# Patient Record
Sex: Female | Born: 1975 | Race: White | Hispanic: No | Marital: Married | Smoking: Never smoker
Health system: Southern US, Community
[De-identification: ages and names within clinical notes are randomized; demographics above are authoritative.]

## PROBLEM LIST (undated history)

## (undated) DIAGNOSIS — K219 Gastro-esophageal reflux disease without esophagitis: Secondary | ICD-10-CM

## (undated) DIAGNOSIS — I10 Essential (primary) hypertension: Secondary | ICD-10-CM

## (undated) DIAGNOSIS — D259 Leiomyoma of uterus, unspecified: Secondary | ICD-10-CM

## (undated) DIAGNOSIS — Z8669 Personal history of other diseases of the nervous system and sense organs: Secondary | ICD-10-CM

## (undated) DIAGNOSIS — R002 Palpitations: Secondary | ICD-10-CM

## (undated) DIAGNOSIS — G43B0 Ophthalmoplegic migraine, not intractable: Secondary | ICD-10-CM

## (undated) DIAGNOSIS — Z872 Personal history of diseases of the skin and subcutaneous tissue: Secondary | ICD-10-CM

## (undated) DIAGNOSIS — D649 Anemia, unspecified: Secondary | ICD-10-CM

## (undated) DIAGNOSIS — Z87898 Personal history of other specified conditions: Secondary | ICD-10-CM

## (undated) DIAGNOSIS — O139 Gestational [pregnancy-induced] hypertension without significant proteinuria, unspecified trimester: Secondary | ICD-10-CM

## (undated) DIAGNOSIS — E039 Hypothyroidism, unspecified: Secondary | ICD-10-CM

## (undated) DIAGNOSIS — K5190 Ulcerative colitis, unspecified, without complications: Secondary | ICD-10-CM

## (undated) DIAGNOSIS — Z973 Presence of spectacles and contact lenses: Secondary | ICD-10-CM

## (undated) DIAGNOSIS — E7800 Pure hypercholesterolemia, unspecified: Secondary | ICD-10-CM

## (undated) DIAGNOSIS — F329 Major depressive disorder, single episode, unspecified: Secondary | ICD-10-CM

## (undated) DIAGNOSIS — R0600 Dyspnea, unspecified: Secondary | ICD-10-CM

## (undated) DIAGNOSIS — F32A Depression, unspecified: Secondary | ICD-10-CM

## (undated) HISTORY — DX: Ophthalmoplegic migraine, not intractable: G43.B0

## (undated) HISTORY — PX: DILATION AND CURETTAGE OF UTERUS: SHX78

## (undated) HISTORY — PX: COLONOSCOPY: SHX174

## (undated) HISTORY — DX: Ulcerative colitis, unspecified, without complications: K51.90

---

## 1998-05-09 ENCOUNTER — Other Ambulatory Visit: Admission: RE | Admit: 1998-05-09 | Discharge: 1998-05-09 | Payer: Self-pay

## 1999-12-07 ENCOUNTER — Other Ambulatory Visit: Admission: RE | Admit: 1999-12-07 | Discharge: 1999-12-07 | Payer: Self-pay | Admitting: Obstetrics and Gynecology

## 2000-01-28 ENCOUNTER — Encounter: Payer: Self-pay | Admitting: Obstetrics and Gynecology

## 2000-01-28 ENCOUNTER — Ambulatory Visit (HOSPITAL_COMMUNITY): Admission: RE | Admit: 2000-01-28 | Discharge: 2000-01-28 | Payer: Self-pay | Admitting: Obstetrics and Gynecology

## 2000-02-25 ENCOUNTER — Ambulatory Visit (HOSPITAL_COMMUNITY): Admission: RE | Admit: 2000-02-25 | Discharge: 2000-02-25 | Payer: Self-pay | Admitting: Obstetrics and Gynecology

## 2000-02-25 ENCOUNTER — Encounter: Payer: Self-pay | Admitting: Obstetrics and Gynecology

## 2000-04-28 ENCOUNTER — Inpatient Hospital Stay (HOSPITAL_COMMUNITY): Admission: AD | Admit: 2000-04-28 | Discharge: 2000-04-28 | Payer: Self-pay | Admitting: Obstetrics and Gynecology

## 2000-05-05 ENCOUNTER — Encounter (HOSPITAL_COMMUNITY): Admission: RE | Admit: 2000-05-05 | Discharge: 2000-06-03 | Payer: Self-pay | Admitting: Obstetrics and Gynecology

## 2000-05-26 ENCOUNTER — Inpatient Hospital Stay (HOSPITAL_COMMUNITY): Admission: AD | Admit: 2000-05-26 | Discharge: 2000-05-26 | Payer: Self-pay | Admitting: Obstetrics and Gynecology

## 2000-05-30 ENCOUNTER — Inpatient Hospital Stay (HOSPITAL_COMMUNITY): Admission: AD | Admit: 2000-05-30 | Discharge: 2000-06-01 | Payer: Self-pay | Admitting: Obstetrics and Gynecology

## 2000-05-31 ENCOUNTER — Encounter: Payer: Self-pay | Admitting: Obstetrics and Gynecology

## 2000-06-02 ENCOUNTER — Inpatient Hospital Stay (HOSPITAL_COMMUNITY): Admission: AD | Admit: 2000-06-02 | Discharge: 2000-06-05 | Payer: Self-pay | Admitting: Obstetrics and Gynecology

## 2000-06-02 ENCOUNTER — Encounter (INDEPENDENT_AMBULATORY_CARE_PROVIDER_SITE_OTHER): Payer: Self-pay | Admitting: Specialist

## 2000-06-07 ENCOUNTER — Encounter: Admission: RE | Admit: 2000-06-07 | Discharge: 2000-06-21 | Payer: Self-pay | Admitting: Obstetrics and Gynecology

## 2000-12-13 ENCOUNTER — Other Ambulatory Visit: Admission: RE | Admit: 2000-12-13 | Discharge: 2000-12-13 | Payer: Self-pay | Admitting: Obstetrics and Gynecology

## 2001-12-22 ENCOUNTER — Other Ambulatory Visit: Admission: RE | Admit: 2001-12-22 | Discharge: 2001-12-22 | Payer: Self-pay | Admitting: Obstetrics and Gynecology

## 2003-02-14 ENCOUNTER — Other Ambulatory Visit: Admission: RE | Admit: 2003-02-14 | Discharge: 2003-02-14 | Payer: Self-pay | Admitting: Obstetrics and Gynecology

## 2003-11-29 ENCOUNTER — Other Ambulatory Visit: Admission: RE | Admit: 2003-11-29 | Discharge: 2003-11-29 | Payer: Self-pay | Admitting: Obstetrics and Gynecology

## 2004-05-26 ENCOUNTER — Inpatient Hospital Stay (HOSPITAL_COMMUNITY): Admission: AD | Admit: 2004-05-26 | Discharge: 2004-05-26 | Payer: Self-pay | Admitting: Obstetrics and Gynecology

## 2004-06-04 ENCOUNTER — Inpatient Hospital Stay (HOSPITAL_COMMUNITY): Admission: AD | Admit: 2004-06-04 | Discharge: 2004-06-06 | Payer: Self-pay | Admitting: Obstetrics and Gynecology

## 2004-07-17 ENCOUNTER — Other Ambulatory Visit: Admission: RE | Admit: 2004-07-17 | Discharge: 2004-07-17 | Payer: Self-pay | Admitting: Obstetrics and Gynecology

## 2005-01-18 ENCOUNTER — Other Ambulatory Visit: Admission: RE | Admit: 2005-01-18 | Discharge: 2005-01-18 | Payer: Self-pay | Admitting: Obstetrics and Gynecology

## 2006-08-03 ENCOUNTER — Encounter: Admission: RE | Admit: 2006-08-03 | Discharge: 2006-08-03 | Payer: Self-pay | Admitting: Gastroenterology

## 2006-08-22 ENCOUNTER — Ambulatory Visit (HOSPITAL_COMMUNITY): Admission: RE | Admit: 2006-08-22 | Discharge: 2006-08-22 | Payer: Self-pay | Admitting: *Deleted

## 2006-08-22 ENCOUNTER — Encounter (INDEPENDENT_AMBULATORY_CARE_PROVIDER_SITE_OTHER): Payer: Self-pay | Admitting: Specialist

## 2006-08-30 HISTORY — PX: CHOLECYSTECTOMY: SHX55

## 2009-07-30 HISTORY — PX: DILATION AND CURETTAGE OF UTERUS: SHX78

## 2009-08-25 ENCOUNTER — Ambulatory Visit (HOSPITAL_COMMUNITY): Admission: RE | Admit: 2009-08-25 | Discharge: 2009-08-25 | Payer: Self-pay | Admitting: Obstetrics and Gynecology

## 2009-11-02 ENCOUNTER — Emergency Department (HOSPITAL_COMMUNITY): Admission: EM | Admit: 2009-11-02 | Discharge: 2009-11-03 | Payer: Self-pay | Admitting: Emergency Medicine

## 2009-11-17 ENCOUNTER — Ambulatory Visit (HOSPITAL_COMMUNITY): Admission: RE | Admit: 2009-11-17 | Discharge: 2009-11-17 | Payer: Self-pay | Admitting: Obstetrics and Gynecology

## 2010-09-15 LAB — TYPE AND SCREEN: Antibody Screen: NEGATIVE

## 2010-09-15 LAB — ABO/RH: RH Type: POSITIVE

## 2010-09-15 LAB — RUBELLA ANTIBODY, IGM: Rubella: IMMUNE

## 2010-09-15 LAB — RPR: RPR: NONREACTIVE

## 2010-11-20 LAB — CBC
HCT: 38.3 % (ref 36.0–46.0)
Hemoglobin: 12.9 g/dL (ref 12.0–15.0)
MCHC: 33.8 g/dL (ref 30.0–36.0)
MCV: 90.5 fL (ref 78.0–100.0)
Platelets: 322 10*3/uL (ref 150–400)
RBC: 4.23 MIL/uL (ref 3.87–5.11)
RDW: 11.9 % (ref 11.5–15.5)
WBC: 6.3 10*3/uL (ref 4.0–10.5)

## 2010-11-20 LAB — HCG, QUANTITATIVE, PREGNANCY: hCG, Beta Chain, Quant, S: <2 m[IU]/mL (ref <5)

## 2010-11-23 LAB — COMPREHENSIVE METABOLIC PANEL
ALT: 12 U/L (ref 0–35)
Albumin: 3.9 g/dL (ref 3.5–5.2)
Alkaline Phosphatase: 53 U/L (ref 39–117)
BUN: 10 mg/dL (ref 6–23)
Chloride: 107 mEq/L (ref 96–112)
Glucose, Bld: 100 mg/dL — ABNORMAL HIGH (ref 70–99)
Potassium: 3.7 mEq/L (ref 3.5–5.1)
Sodium: 136 mEq/L (ref 135–145)
Total Bilirubin: 0.4 mg/dL (ref 0.3–1.2)
Total Protein: 6.7 g/dL (ref 6.0–8.3)

## 2010-11-23 LAB — POCT I-STAT, CHEM 8
Calcium, Ion: 1.1 mmol/L — ABNORMAL LOW (ref 1.12–1.32)
Creatinine, Ser: 0.4 mg/dL (ref 0.4–1.2)
Glucose, Bld: 96 mg/dL (ref 70–99)
HCT: 36 % (ref 36.0–46.0)
Hemoglobin: 12.2 g/dL (ref 12.0–15.0)
Potassium: 3.7 mEq/L (ref 3.5–5.1)
TCO2: 22 mmol/L (ref 0–100)

## 2010-11-23 LAB — URINE MICROSCOPIC-ADD ON

## 2010-11-23 LAB — URINALYSIS, ROUTINE W REFLEX MICROSCOPIC
Nitrite: NEGATIVE
Protein, ur: 30 mg/dL — AB
Specific Gravity, Urine: 1.01 (ref 1.005–1.030)
Urobilinogen, UA: 0.2 mg/dL (ref 0.0–1.0)

## 2010-11-23 LAB — DIFFERENTIAL
Basophils Absolute: 0 10*3/uL (ref 0.0–0.1)
Basophils Relative: 0 % (ref 0–1)
Eosinophils Absolute: 0.6 10*3/uL (ref 0.0–0.7)
Monocytes Absolute: 0.8 10*3/uL (ref 0.1–1.0)
Monocytes Relative: 8 % (ref 3–12)
Neutro Abs: 4.7 10*3/uL (ref 1.7–7.7)

## 2010-11-23 LAB — URINE CULTURE

## 2010-11-23 LAB — CBC
HCT: 36.2 % (ref 36.0–46.0)
Hemoglobin: 12.4 g/dL (ref 12.0–15.0)
Platelets: 339 10*3/uL (ref 150–400)
WBC: 9.7 10*3/uL (ref 4.0–10.5)

## 2010-11-23 LAB — POCT CARDIAC MARKERS: CKMB, poc: <1 ng/mL — ABNORMAL LOW (ref 1.0–8.0)

## 2010-11-30 LAB — CBC
HCT: 36.4 % (ref 36.0–46.0)
Hemoglobin: 12.3 g/dL (ref 12.0–15.0)
MCHC: 33.9 g/dL (ref 30.0–36.0)
MCV: 90.1 fL (ref 78.0–100.0)
RBC: 4.04 MIL/uL (ref 3.87–5.11)
RDW: 12.2 % (ref 11.5–15.5)
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
## 2011-01-15 NOTE — Discharge Summary (Signed)
[place]  Patient:    Gabrielle Wilkins, Gabrielle Wilkins                          MRN: [id number] Adm. Date:  [id number] Disc. Date: [id number] Attending:  Lenell Antu                           Discharge Summary  DISCHARGE DIAGNOSES:          1. Preterm pregnancy at 35+ weeks.                               2. Chronic hypertension.                               3. Rule out preeclampsia.  DISCHARGE FOLLOW-UP:          Patient is to follow up in our office in two days for repeat blood pressure checks and is to check her blood pressures at home.  DISCHARGE MEDICATIONS:        Aldomet 500 mg b.i.d. q.a.m. and q.p.m.  HOSPITAL COURSE:              Patient is a 35 year old white female G1, P0 who was admitted at 91 4/7 weeks with increasing blood pressure.  Patient known to have chronic hypertension, however, required only Aldomet 250 mg q.d. during pregnancy and was quite intolerant of higher doses.  Recently, patient had had increasing blood pressures noted to be 150/110 on her scheduled NST.  She did have some headache and some right upper quadrant pain periodically, but no nausea and vomiting and no increased swelling.  Her preeclamptic laboratories are stable and she was admitted to perform a 24 hour urine and have optimization of her blood pressure control.  Pregnancy otherwise had been uncomplicated except for the hypertension.  PRENATAL LABORATORIES:        O+.  RPR nonreactive.  Rubella equivocal. Hepatitis B surface antigen negative.  HIV negative.  GC negative.  Chlamydia negative.  GBS negative.  GYNECOLOGICAL HISTORY:        Patient had mild dysplasia in 1998.  PAST MEDICAL HISTORY:         Chronic hypertension.  PAST SURGICAL HISTORY:        None.  MEDICATIONS:                  Aldomet 250 mg q.d.  ALLERGIES:                    CECLOR.  PHYSICAL EXAMINATION:  VITAL SIGNS:                  Blood pressure 156/111.  Other vital signs  were stable.  Fetal heart rate was reactive.  ABDOMEN:                      She was gravid in the EFW of approximately 6.5 pounds.  LUNGS:                        Clear.  HEART:                        Regular rate and rhythm.  EXTREMITIES:  Only with trace edema.  DTRs 2+ bilaterally with no clonus noted.                                The patient was admitted at 35 weeks with worsening hypertension and was going to have an ultrasound to rule out any oligo or IUGR.  On hospital day #2 the patient was feeling better.  Many of her symptoms had resolved.  Blood pressures were 140s-150s/90s-100 and that was on 250 mg b.i.d. of Aldomet.  Ultrasound revealed an EFW in the seventeenth percentile with normal AFI.  Dopplers were normal.  On hospital day #3 patient felt better with no significant PIH symptoms.  Blood pressures were 138-168/90-108.  Fetal heart rate was reactive.  She still had no other physical manifestations of preeclampsia.  Blood pressures on bed rest had improved, but still had room for optimization.  Therefore, the patient was increased to 500 mg p.o. b.i.d. of her Aldomet and her blood pressures were monitored throughout the day.  Later in the afternoon her blood pressures were consistently 150s/90s-102.  Therefore, the decision was made to discharge her to home to monitor her blood pressures and symptoms at home and call should they change significantly. DD:  06/27/00 TD:  06/27/00 Job: [id number]

## 2011-01-15 NOTE — Op Note (Signed)
Gabrielle Wilkins, Gabrielle Wilkins                   ACCOUNT NO.:  [id number]   MEDICAL RECORD NO.:  [id number]          PATIENT TYPE:  AMB   LOCATION:  DAY                          FACILITY:  [facility]   PHYSICIAN:  Jonne Ply, MD   DATE OF BIRTH:  [birth year]   DATE OF PROCEDURE:  08/22/2006  DATE OF DISCHARGE:                               OPERATIVE REPORT   PREOPERATIVE DIAGNOSIS:  Cholelithiasis.   POSTOPERATIVE DIAGNOSIS:  Cholelithiasis.   PROCEDURE:  Laparoscopic cholecystectomy.   SURGEON:  Glean Hess, M.D.   ANESTHESIA:  General.   ASSISTANT:  None.   DESCRIPTION:  The patient was taken to the operating room, placed in a  supine position.  After adequate general anesthesia was induced using  endotracheal tube, the abdomen was prepped and draped in normal sterile  fashion.  Using a transverse infraumbilical incision I dissected down to  fascia.  Fascia was opened vertically.  A 0 Vicryl pursestring suture  was placed around the fascial defect and Hassan trocar was placed in the  abdomen.  Pneumoperitoneum was obtained.  Under direct vision additional  11 mm trocars and 5 mm trocars were placed in the abdomen.  The  gallbladder was identified and retracted cephalad.  The neck of the  gallbladder was identified.  There was a number of adhesions to it.  I  dissected those off bluntly.  The cystic duct was identified with a  critical view and dissected and clipped proximally right on the  gallbladder.  Small ductotomy was made and cholangiocatheter was  inserted within the cystic duct.  Cholangiogram was performed which  showed normal filling of the common duct and the both hepatic ducts and  free flow into the duodenum.  The cystic duct was then triply clipped  and divided and cholangiocatheter was removed.  The cystic artery was  then identified again with a critical view and triply clipped and  divided.  Gallbladder was taken off the gallbladder bed using Bovie  electrocautery,  placed in EndoCatch bag and removed through the  umbilical port.  Adequate hemostasis was ensured.  A small piece of  Surgicel was placed in the abdomen right in the gallbladder fossa and  there was no evidence of bleeding or bile leakage.  The pneumoperitoneum  was released and trocars were removed.  The infraumbilical fascial  defect was closed using the 0 Vicryl pursestring suture.  The skin  incisions were closed using subcuticular 4-0 Monocryls and then Steri-  Strips.  Sterile dressings were applied.  The patient tolerated the  procedure well, went to PACU in good condition.      Jonne Ply, MD  Electronically Signed     KRE/MEDQ  D:  08/22/2006  T:  08/22/2006  Job:  [id number]
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
## 2011-02-26 ENCOUNTER — Other Ambulatory Visit (HOSPITAL_COMMUNITY): Payer: Self-pay | Admitting: Obstetrics and Gynecology

## 2011-02-26 DIAGNOSIS — O4400 Placenta previa specified as without hemorrhage, unspecified trimester: Secondary | ICD-10-CM

## 2011-03-01 ENCOUNTER — Ambulatory Visit (HOSPITAL_COMMUNITY)
Admission: RE | Admit: 2011-03-01 | Discharge: 2011-03-01 | Disposition: A | Discharge status: Home / Self Care | Payer: BC Managed Care – PPO | Source: Ambulatory Visit | Attending: Obstetrics and Gynecology | Admitting: Obstetrics and Gynecology

## 2011-03-01 DIAGNOSIS — O4400 Placenta previa specified as without hemorrhage, unspecified trimester: Secondary | ICD-10-CM

## 2011-03-01 DIAGNOSIS — O09299 Supervision of pregnancy with other poor reproductive or obstetric history, unspecified trimester: Secondary | ICD-10-CM | POA: Insufficient documentation

## 2011-03-01 DIAGNOSIS — O4410 Placenta previa with hemorrhage, unspecified trimester: Secondary | ICD-10-CM | POA: Insufficient documentation

## 2011-03-22 LAB — STREP B DNA PROBE: GBS: NEGATIVE

## 2011-04-04 ENCOUNTER — Encounter (HOSPITAL_COMMUNITY): Payer: Self-pay | Admitting: *Deleted

## 2011-04-04 ENCOUNTER — Other Ambulatory Visit: Payer: Self-pay | Admitting: Obstetrics and Gynecology

## 2011-04-04 ENCOUNTER — Inpatient Hospital Stay (HOSPITAL_COMMUNITY)
Admission: AD | Admit: 2011-04-04 | Discharge: 2011-04-07 | DRG: 651 | Disposition: A | Discharge status: Home / Self Care | Payer: BC Managed Care – PPO | Source: Ambulatory Visit | Attending: Obstetrics and Gynecology | Admitting: Obstetrics and Gynecology

## 2011-04-04 DIAGNOSIS — O321XX0 Maternal care for breech presentation, not applicable or unspecified: Secondary | ICD-10-CM | POA: Diagnosis present

## 2011-04-04 DIAGNOSIS — O1002 Pre-existing essential hypertension complicating childbirth: Secondary | ICD-10-CM | POA: Diagnosis present

## 2011-04-04 DIAGNOSIS — O4410 Placenta previa with hemorrhage, unspecified trimester: Principal | ICD-10-CM | POA: Diagnosis present

## 2011-04-04 HISTORY — DX: Essential (primary) hypertension: I10

## 2011-04-04 HISTORY — DX: Depression, unspecified: F32.A

## 2011-04-04 HISTORY — DX: Gestational (pregnancy-induced) hypertension without significant proteinuria, unspecified trimester: O13.9

## 2011-04-04 HISTORY — DX: Major depressive disorder, single episode, unspecified: F32.9

## 2011-04-05 ENCOUNTER — Inpatient Hospital Stay (HOSPITAL_COMMUNITY): Payer: BC Managed Care – PPO | Admitting: Anesthesiology

## 2011-04-05 ENCOUNTER — Encounter (HOSPITAL_COMMUNITY): Payer: Self-pay | Admitting: *Deleted

## 2011-04-05 ENCOUNTER — Inpatient Hospital Stay (HOSPITAL_COMMUNITY): Admission: RE | Admit: 2011-04-05 | Payer: BC Managed Care – PPO | Source: Ambulatory Visit

## 2011-04-05 ENCOUNTER — Encounter (HOSPITAL_COMMUNITY)
Admission: AD | Disposition: A | Discharge status: Home / Self Care | Payer: Self-pay | Source: Ambulatory Visit | Attending: Obstetrics and Gynecology

## 2011-04-05 ENCOUNTER — Encounter (HOSPITAL_COMMUNITY): Payer: Self-pay | Admitting: Anesthesiology

## 2011-04-05 LAB — TYPE AND SCREEN: ABO/RH(D): O POS

## 2011-04-05 LAB — CBC
MCH: 29.9 pg (ref 26.0–34.0)
MCHC: 34.4 g/dL (ref 30.0–36.0)
RDW: 13.2 % (ref 11.5–15.5)

## 2011-04-05 LAB — COMPREHENSIVE METABOLIC PANEL
ALT: 13 U/L (ref 0–35)
Albumin: 2.8 g/dL — ABNORMAL LOW (ref 3.5–5.2)
Alkaline Phosphatase: 178 U/L — ABNORMAL HIGH (ref 39–117)
BUN: 5 mg/dL — ABNORMAL LOW (ref 6–23)
Calcium: 9.6 mg/dL (ref 8.4–10.5)
Potassium: 4.1 mEq/L (ref 3.5–5.1)
Sodium: 136 mEq/L (ref 135–145)
Total Protein: 6.6 g/dL (ref 6.0–8.3)

## 2011-04-05 SURGERY — Surgical Case
Anesthesia: Spinal | Site: Abdomen | Wound class: Clean Contaminated

## 2011-04-05 MED ORDER — DIPHENHYDRAMINE HCL 50 MG/ML IJ SOLN: 25.0000 mg | INTRAMUSCULAR | Status: DC | PRN

## 2011-04-05 MED ORDER — FENTANYL CITRATE 0.05 MG/ML IJ SOLN
INTRAMUSCULAR | Status: AC
  Filled 2011-04-05: qty 2

## 2011-04-05 MED ORDER — CITRIC ACID-SODIUM CITRATE 334-500 MG/5ML PO SOLN: 30.0000 mL | Freq: Once | ORAL | Status: DC

## 2011-04-05 MED ORDER — TETANUS-DIPHTH-ACELL PERTUSSIS 5-2.5-18.5 LF-MCG/0.5 IM SUSP
0.5000 mL | Freq: Once | INTRAMUSCULAR | Status: AC
  Administered 2011-04-06: 0.5 mL via INTRAMUSCULAR
  Filled 2011-04-05: qty 0.5

## 2011-04-05 MED ORDER — IBUPROFEN 600 MG PO TABS
600.0000 mg | ORAL_TABLET | Freq: Four times a day (QID) | ORAL | Status: DC
  Administered 2011-04-05 – 2011-04-07 (×8): 600 mg via ORAL
  Filled 2011-04-05 (×5): qty 1

## 2011-04-05 MED ORDER — LACTATED RINGERS IV SOLN
INTRAVENOUS | Status: DC
  Administered 2011-04-05 (×5): via INTRAVENOUS

## 2011-04-05 MED ORDER — ONDANSETRON HCL 4 MG/2ML IJ SOLN
INTRAMUSCULAR | Status: AC
  Filled 2011-04-05: qty 2

## 2011-04-05 MED ORDER — WITCH HAZEL-GLYCERIN EX PADS: 1.0000 "application " | MEDICATED_PAD | CUTANEOUS | Status: DC | PRN

## 2011-04-05 MED ORDER — KETOROLAC TROMETHAMINE 30 MG/ML IJ SOLN: 30.0000 mg | Freq: Four times a day (QID) | INTRAMUSCULAR | Status: AC | PRN

## 2011-04-05 MED ORDER — SODIUM CHLORIDE 0.9 % IR SOLN
Status: DC | PRN
  Administered 2011-04-05: 1000 mL

## 2011-04-05 MED ORDER — OXYCODONE-ACETAMINOPHEN 5-325 MG PO TABS
1.0000 | ORAL_TABLET | ORAL | Status: DC | PRN
  Administered 2011-04-05 – 2011-04-06 (×4): 1 via ORAL
  Filled 2011-04-05 (×4): qty 1

## 2011-04-05 MED ORDER — GENTAMICIN IN SALINE 1-0.9 MG/ML-% IV SOLN: 100.0000 mg | INTRAVENOUS | Status: DC

## 2011-04-05 MED ORDER — CLINDAMYCIN PHOSPHATE 600 MG/50ML IV SOLN
INTRAVENOUS | Status: DC | PRN
  Administered 2011-04-05: 900 mg via INTRAVENOUS

## 2011-04-05 MED ORDER — MORPHINE SULFATE (PF) 0.5 MG/ML IJ SOLN
INTRAMUSCULAR | Status: DC | PRN
Start: 2011-04-05 — End: 2011-04-05
  Administered 2011-04-05 (×3): 1000 ug via INTRAVENOUS
  Administered 2011-04-05: 850 ug via INTRAVENOUS
  Administered 2011-04-05: 1000 ug via INTRAVENOUS

## 2011-04-05 MED ORDER — CLINDAMYCIN PHOSPHATE 900 MG/50ML IV SOLN: 900.0000 mg | INTRAVENOUS | Status: DC

## 2011-04-05 MED ORDER — ZOLPIDEM TARTRATE 5 MG PO TABS: 5.0000 mg | ORAL_TABLET | Freq: Every evening | ORAL | Status: DC | PRN

## 2011-04-05 MED ORDER — DIBUCAINE 1 % RE OINT: 1.0000 "application " | TOPICAL_OINTMENT | RECTAL | Status: DC | PRN

## 2011-04-05 MED ORDER — OXYTOCIN 10 UNIT/ML IJ SOLN
INTRAMUSCULAR | Status: AC
  Filled 2011-04-05: qty 4

## 2011-04-05 MED ORDER — GENTAMICIN SULFATE 40 MG/ML IJ SOLN
Freq: Once | INTRAVENOUS | Status: DC
  Filled 2011-04-05: qty 2.5

## 2011-04-05 MED ORDER — SENNOSIDES-DOCUSATE SODIUM 8.6-50 MG PO TABS
2.0000 | ORAL_TABLET | Freq: Every day | ORAL | Status: DC
  Administered 2011-04-05 – 2011-04-06 (×2): 2 via ORAL

## 2011-04-05 MED ORDER — NALOXONE HCL 0.4 MG/ML IJ SOLN: 0.4000 mg | INTRAMUSCULAR | Status: DC | PRN

## 2011-04-05 MED ORDER — MENTHOL 3 MG MT LOZG: 1.0000 | LOZENGE | OROMUCOSAL | Status: DC | PRN

## 2011-04-05 MED ORDER — OXYTOCIN 20 UNITS IN LACTATED RINGERS INFUSION - SIMPLE: 125.0000 mL/h | INTRAVENOUS | Status: AC

## 2011-04-05 MED ORDER — PHENYLEPHRINE HCL 10 MG/ML IJ SOLN
INTRAMUSCULAR | Status: DC | PRN
  Administered 2011-04-05: 120 ug via INTRAVENOUS
  Administered 2011-04-05: 80 ug via INTRAVENOUS

## 2011-04-05 MED ORDER — DIPHENHYDRAMINE HCL 25 MG PO CAPS: 25.0000 mg | ORAL_CAPSULE | Freq: Four times a day (QID) | ORAL | Status: DC | PRN

## 2011-04-05 MED ORDER — SODIUM CHLORIDE 0.9 % IV SOLN
1.0000 ug/kg/h | INTRAVENOUS | Status: DC | PRN
  Filled 2011-04-05: qty 2.5

## 2011-04-05 MED ORDER — ONDANSETRON HCL 4 MG PO TABS: 4.0000 mg | ORAL_TABLET | ORAL | Status: DC | PRN

## 2011-04-05 MED ORDER — KETOROLAC TROMETHAMINE 60 MG/2ML IM SOLN
60.0000 mg | Freq: Once | INTRAMUSCULAR | Status: AC | PRN
  Administered 2011-04-05: 60 mg via INTRAMUSCULAR

## 2011-04-05 MED ORDER — CLINDAMYCIN PHOSPHATE 900 MG/50ML IV SOLN
900.0000 mg | INTRAVENOUS | Status: DC
Start: 2011-04-05 — End: 2011-04-05

## 2011-04-05 MED ORDER — MEPERIDINE HCL 25 MG/ML IJ SOLN
INTRAMUSCULAR | Status: AC
  Filled 2011-04-05: qty 1

## 2011-04-05 MED ORDER — NALBUPHINE HCL 10 MG/ML IJ SOLN
5.0000 mg | INTRAMUSCULAR | Status: DC | PRN
  Filled 2011-04-05: qty 1

## 2011-04-05 MED ORDER — LANOLIN HYDROUS EX OINT: 1.0000 "application " | TOPICAL_OINTMENT | CUTANEOUS | Status: DC | PRN

## 2011-04-05 MED ORDER — MORPHINE SULFATE 0.5 MG/ML IJ SOLN
INTRAMUSCULAR | Status: AC
  Filled 2011-04-05: qty 10

## 2011-04-05 MED ORDER — DIPHENHYDRAMINE HCL 25 MG PO CAPS: 25.0000 mg | ORAL_CAPSULE | ORAL | Status: DC | PRN

## 2011-04-05 MED ORDER — SCOPOLAMINE 1 MG/3DAYS TD PT72
1.0000 | MEDICATED_PATCH | Freq: Once | TRANSDERMAL | Status: DC
  Administered 2011-04-05: 1.5 mg via TRANSDERMAL

## 2011-04-05 MED ORDER — DIPHENHYDRAMINE HCL 50 MG/ML IJ SOLN: 12.5000 mg | INTRAMUSCULAR | Status: DC | PRN

## 2011-04-05 MED ORDER — SIMETHICONE 80 MG PO CHEW
80.0000 mg | CHEWABLE_TABLET | Freq: Three times a day (TID) | ORAL | Status: DC
  Administered 2011-04-05 – 2011-04-06 (×8): 80 mg via ORAL

## 2011-04-05 MED ORDER — MEPERIDINE HCL 25 MG/ML IJ SOLN
6.2500 mg | INTRAMUSCULAR | Status: DC | PRN
Start: 2011-04-05 — End: 2011-04-05

## 2011-04-05 MED ORDER — ONDANSETRON HCL 4 MG/2ML IJ SOLN: 4.0000 mg | INTRAMUSCULAR | Status: DC | PRN

## 2011-04-05 MED ORDER — MORPHINE SULFATE (PF) 0.5 MG/ML IJ SOLN
INTRAMUSCULAR | Status: DC | PRN
  Administered 2011-04-05: 150 ug via INTRATHECAL

## 2011-04-05 MED ORDER — MESALAMINE 1000 MG RE SUPP
1000.0000 mg | Freq: Every day | RECTAL | Status: DC
  Administered 2011-04-05 – 2011-04-06 (×2): 1000 mg via RECTAL
  Filled 2011-04-05 (×2): qty 1

## 2011-04-05 MED ORDER — FENTANYL CITRATE 0.05 MG/ML IJ SOLN
INTRAMUSCULAR | Status: DC | PRN
  Administered 2011-04-05: 25 ug via INTRATHECAL

## 2011-04-05 MED ORDER — OXYTOCIN 10 UNIT/ML IJ SOLN
20.0000 [IU] | INTRAVENOUS | Status: DC | PRN
  Administered 2011-04-05: 20 [IU] via INTRAVENOUS

## 2011-04-05 MED ORDER — MEPERIDINE HCL 25 MG/ML IJ SOLN
INTRAMUSCULAR | Status: DC | PRN
Start: 2011-04-05 — End: 2011-04-05
  Administered 2011-04-05: 25 mg via INTRAVENOUS

## 2011-04-05 MED ORDER — ONDANSETRON HCL 4 MG/2ML IJ SOLN: 4.0000 mg | Freq: Three times a day (TID) | INTRAMUSCULAR | Status: DC | PRN

## 2011-04-05 MED ORDER — ONDANSETRON HCL 4 MG/2ML IJ SOLN
INTRAMUSCULAR | Status: DC | PRN
  Administered 2011-04-05: 4 mg via INTRAVENOUS

## 2011-04-05 MED ORDER — SIMETHICONE 80 MG PO CHEW: 80.0000 mg | CHEWABLE_TABLET | ORAL | Status: DC | PRN

## 2011-04-05 MED ORDER — FENTANYL CITRATE 0.05 MG/ML IJ SOLN
INTRAMUSCULAR | Status: DC | PRN
  Administered 2011-04-05: 75 ug via INTRAVENOUS

## 2011-04-05 MED ORDER — LACTATED RINGERS IV SOLN
INTRAVENOUS | Status: DC
  Administered 2011-04-05: 08:00:00 via INTRAVENOUS

## 2011-04-05 MED ORDER — SCOPOLAMINE 1 MG/3DAYS TD PT72
MEDICATED_PATCH | TRANSDERMAL | Status: AC
  Administered 2011-04-05: 1.5 mg via TRANSDERMAL
  Filled 2011-04-05: qty 1

## 2011-04-05 MED ORDER — IBUPROFEN 600 MG PO TABS
600.0000 mg | ORAL_TABLET | Freq: Four times a day (QID) | ORAL | Status: DC | PRN
  Administered 2011-04-05 – 2011-04-06 (×2): 600 mg via ORAL
  Filled 2011-04-05 (×4): qty 1

## 2011-04-05 MED ORDER — KETOROLAC TROMETHAMINE 60 MG/2ML IM SOLN
INTRAMUSCULAR | Status: AC
  Administered 2011-04-05: 60 mg via INTRAMUSCULAR
  Filled 2011-04-05: qty 2

## 2011-04-05 MED ORDER — CITRIC ACID-SODIUM CITRATE 334-500 MG/5ML PO SOLN
ORAL | Status: AC
  Administered 2011-04-05: 30 mL
  Filled 2011-04-05: qty 15

## 2011-04-05 MED ORDER — GENTAMICIN IN SALINE 1.6-0.9 MG/ML-% IV SOLN
INTRAVENOUS | Status: DC | PRN
  Administered 2011-04-05: 100 mg via INTRAVENOUS

## 2011-04-05 MED ORDER — PRENATAL PLUS 27-1 MG PO TABS
1.0000 | ORAL_TABLET | Freq: Every day | ORAL | Status: DC
  Administered 2011-04-05 – 2011-04-07 (×3): 1 via ORAL
  Filled 2011-04-05 (×3): qty 1

## 2011-04-05 MED ORDER — BUPIVACAINE IN DEXTROSE 0.75-8.25 % IT SOLN
INTRATHECAL | Status: DC | PRN
  Administered 2011-04-05: 1.5 mL via INTRATHECAL

## 2011-04-05 MED ORDER — NALBUPHINE HCL 10 MG/ML IJ SOLN
5.0000 mg | INTRAMUSCULAR | Status: DC | PRN
Start: 2011-04-05 — End: 2011-04-07
  Filled 2011-04-05: qty 1

## 2011-04-05 MED ORDER — METOCLOPRAMIDE HCL 5 MG/ML IJ SOLN: 10.0000 mg | Freq: Three times a day (TID) | INTRAMUSCULAR | Status: DC | PRN

## 2011-04-05 MED ORDER — PHENYLEPHRINE 40 MCG/ML (10ML) SYRINGE FOR IV PUSH (FOR BLOOD PRESSURE SUPPORT)
PREFILLED_SYRINGE | INTRAVENOUS | Status: AC
  Filled 2011-04-05: qty 10

## 2011-04-05 MED ORDER — SODIUM CHLORIDE 0.9 % IJ SOLN: 3.0000 mL | INTRAMUSCULAR | Status: DC | PRN

## 2011-04-05 SURGICAL SUPPLY — 34 items
BENZOIN TINCTURE PRP APPL 2/3 (GAUZE/BANDAGES/DRESSINGS) ×2 IMPLANT
CHLORAPREP W/TINT 26ML (MISCELLANEOUS) ×2 IMPLANT
CLOSURE STERI STRIP 1/2 X4 (GAUZE/BANDAGES/DRESSINGS) ×2 IMPLANT
CLOTH BEACON ORANGE TIMEOUT ST (SAFETY) ×2 IMPLANT
CONTAINER PREFILL 10% NBF 15ML (MISCELLANEOUS) IMPLANT
DRSG COVADERM 4X8 (GAUZE/BANDAGES/DRESSINGS) ×2 IMPLANT
ELECT REM PT RETURN 9FT ADLT (ELECTROSURGICAL) ×2
ELECTRODE REM PT RTRN 9FT ADLT (ELECTROSURGICAL) ×1 IMPLANT
EXTRACTOR VACUUM KIWI (MISCELLANEOUS) IMPLANT
EXTRACTOR VACUUM M CUP 4 TUBE (SUCTIONS) IMPLANT
GLOVE BIO SURGEON STRL SZ 6.5 (GLOVE) ×4 IMPLANT
GLOVE BIO SURGEON STRL SZ8 (GLOVE) ×2 IMPLANT
GLOVE BIOGEL PI IND STRL 7.5 (GLOVE) ×1 IMPLANT
GLOVE BIOGEL PI INDICATOR 7.5 (GLOVE) ×1
GOWN PREVENTION PLUS LG XLONG (DISPOSABLE) ×4 IMPLANT
KIT ABG SYR 3ML LUER SLIP (SYRINGE) ×2 IMPLANT
NEEDLE HYPO 25X5/8 SAFETYGLIDE (NEEDLE) ×2 IMPLANT
NS IRRIG 1000ML POUR BTL (IV SOLUTION) ×2 IMPLANT
PACK C SECTION WH (CUSTOM PROCEDURE TRAY) ×2 IMPLANT
RTRCTR C-SECT PINK 25CM LRG (MISCELLANEOUS) ×2 IMPLANT
SLEEVE SCD COMPRESS KNEE MED (MISCELLANEOUS) ×2 IMPLANT
STAPLER VISISTAT 35W (STAPLE) IMPLANT
SUT CHROMIC 1 CTX 36 (SUTURE) ×8 IMPLANT
SUT CHROMIC 3 0 SH 27 (SUTURE) ×4 IMPLANT
SUT PLAIN 0 NONE (SUTURE) IMPLANT
SUT PLAIN 2 0 XLH (SUTURE) IMPLANT
SUT VIC AB 0 CT1 27 (SUTURE) ×2
SUT VIC AB 0 CT1 27XBRD ANBCTR (SUTURE) ×2 IMPLANT
SUT VIC AB 2-0 CT1 27 (SUTURE) ×1
SUT VIC AB 2-0 CT1 TAPERPNT 27 (SUTURE) ×1 IMPLANT
SUT VIC AB 4-0 KS 27 (SUTURE) ×2 IMPLANT
TOWEL OR 17X24 6PK STRL BLUE (TOWEL DISPOSABLE) ×4 IMPLANT
TRAY FOLEY CATH 14FR (SET/KITS/TRAYS/PACK) ×2 IMPLANT
WATER STERILE IRR 1000ML POUR (IV SOLUTION) ×2 IMPLANT

## 2011-04-05 NOTE — Anesthesia Postprocedure Evaluation (Signed)
  Anesthesia Post-op Note  Patient: Gabrielle Wilkins  Procedure(s) Performed:  CESAREAN SECTION - baby boy  at  [place] 7/9  Patient Location: PACU  Anesthesia Type: Spinal  Level of Consciousness: awake, alert  and oriented  Airway and Oxygen Therapy: Patient Spontanous Breathing  Post-op Pain: none  Post-op Assessment: Post-op Vital signs reviewed, Patient's Cardiovascular Status Stable, Respiratory Function Stable, Patent Airway, No signs of Nausea or vomiting, Pain level controlled, No headache, No backache, No residual numbness and No residual motor weakness  Post-op Vital Signs: Reviewed and stable  Complications: No apparent anesthesia complications

## 2011-04-05 NOTE — Anesthesia Preprocedure Evaluation (Addendum)
Anesthesia Evaluation  Name, MR# and DOB Patient awake  General Assessment Comment  Reviewed: Allergy & Precautions and H&P   Airway Mallampati: III TM Distance: >3 FB Neck ROM: Full    Dental No notable dental hx. (+) Teeth Intact   Pulmonary  clear to auscultation  pulmonary exam normal   Cardiovascular hypertension, Regular Normal    Neuro/Psych  (+) {AN ROS/MED HX NEURO HEADACHES (+) Depression, Negative Neurological ROS  Negative Psych ROS  GI/Hepatic/Renal negative GI ROS, negative Liver ROS, and negative Renal ROS (+)       Endo/Other    Abdominal   Musculoskeletal   Hematology negative hematology ROS (+)   Peds  Reproductive/Obstetrics (+) Pregnancy    Anesthesia Other Findings             Anesthesia Physical Anesthesia Plan  ASA: II and Emergent  Anesthesia Plan: Spinal   Post-op Pain Management:    Induction:   Airway Management Planned:   Additional Equipment:   Intra-op Plan:   Post-operative Plan:   Informed Consent: I have reviewed the patients History and Physical, chart, labs and discussed the procedure including the risks, benefits and alternatives for the proposed anesthesia with the patient or authorized representative who has indicated his/her understanding and acceptance.   Dental advisory given  Plan Discussed with: Anesthesiologist (AP) and CRNA  Anesthesia Plan Comments:        Anesthesia Quick Evaluation

## 2011-04-05 NOTE — H&P (Signed)
Gabrielle Wilkins is a 35 y.o. female [id number] at 36 5/7 weeks presenting for vaginal bleeding with a placenta previa.  Pt went to bathroom about 1 hour ago and noted a gush of BRB with continued period-like bleeding since then.  FHR is reassuring currently, some mild ctx.  Prenatal care complicated by placenta previa and breech presentation, was scheduled for C/S at 39 weeks.  Also, h/o CHTN with labile blood pressures.  In office as high as 160/100's but w/u for preeclampsia negative with no proteinuria.  Had been on labetolol prior, but was d/c  when blood pressures were too low, and did not tolerate trying to restart it with BP again too low. Maternal Medical History:  Reason for admission: Reason for admission: vaginal bleeding.  Contractions: Onset was 1-2 hours ago.   Frequency: irregular.    Prenatal complications: Placental abnormality.      OB History    Grav Para Term Preterm Abortions TAB SAB Ect Mult Living   5 2 1 1 2  0 2 0 0 2    NSVD 05/2000 6#1oz NSVD 47/8295 6#2ZH Anencephalic fetus 08/65  D&E  Past Medical History  Diagnosis Date  . Hypertension     Chronic Hypertension  . Ulcerative colitis   . Depression   . Pregnancy induced hypertension    Past Surgical History  Procedure Date  . Cholecystectomy 2008  . Dilation and curettage of uterus    Family History:in prenatals Social History:  reports that she has never smoked. She has never used smokeless tobacco. She reports that she does not drink alcohol or use illicit drugs.  ROS vaginal bleeding, no HA or PIH sx    Blood pressure 137/103, pulse 86, temperature 98.7 F (37.1 C), temperature source Oral, resp. rate 20, height 5' 2"  (1.575 m), weight 75.297 kg (166 lb), SpO2 100.00%, unknown if currently breastfeeding. Maternal Exam:  Uterine Assessment: Contraction strength is mild.  Contraction frequency is irregular.   Abdomen: Patient reports no abdominal tenderness. Fetal presentation: breech     Physical  Exam  Constitutional: She is oriented to person, place, and time. She appears well-developed.  Cardiovascular: Normal rate and regular rhythm.   Respiratory: Effort normal and breath sounds normal.  GI: Soft. Bowel sounds are normal.  Genitourinary:       Vaginal bleeding mild/moderate  Neurological: She is alert and oriented to person, place, and time.    Prenatal labs: ABO, Rh:  Opositive Antibody: Negative (01/17 0000) Rubella:  Immune RPR: Nonreactive (01/17 0000)  HBsAg: Negative (01/17 0000)  HIV: Non-reactive (01/17 0000)  GBS: Negative (07/23 0000)  First trimester Screen WNL AFP WNL One Hour GCT 95 GC neg Chlam neg   Assessment/Plan: Pt at 37 5/7 weeks with bleeding previa.  D/w pt need to proceed with C/S and risks/benefits of surgery reviewed including bleeding, iinfection, and possible damage to bowel or bladder. Pt understands and desires to proceed.  Logan Bores 04/05/2011, 12:31 AM

## 2011-04-05 NOTE — OR Nursing (Signed)
Fundal massage by  DLWegner RN

## 2011-04-05 NOTE — Progress Notes (Signed)
Subjective: Postpartum Day 0 Cesarean Delivery Patient reports incisional pain controlled  Objective: Vital signs in last 24 hours: Temp:  [97.3 F (36.3 C)-98.8 F (37.1 C)] 98.8 F (37.1 C) (08/06 0730) Pulse Rate:  [63-86] 73  (08/06 0730) Resp:  [12-20] 18  (08/06 0730) BP: (100-177)/(75-113) 120/82 mmHg (08/06 0730) SpO2:  [96 %-100 %] 98 % (08/06 0730) Weight:  [75.297 kg (166 lb)] 166 lb (75.297 kg) (08/05 2336)  Physical Exam:  General: alert Lochia: appropriate Uterine Fundus: firm Incision: clean, dry    Basename 04/05/11 0020  HGB 11.6*  HCT 33.7*    Assessment/Plan: Status post Cesarean section. Doing well postoperatively.  Continue current care.  Logan Bores 04/05/2011, 8:46 AM

## 2011-04-05 NOTE — Transfer of Care (Signed)
Immediate Anesthesia Transfer of Care Note  Patient: Gabrielle Wilkins  Procedure(s) Performed:  CESAREAN SECTION - baby boy  at  [place] 7/9  Patient Location: PACU  Anesthesia Type: Spinal  Level of Consciousness: awake, alert  and oriented  Airway & Oxygen Therapy: Patient Spontanous Breathing  Post-op Assessment: Report given to PACU RN and Post -op Vital signs reviewed and stable  Post vital signs: stable  Complications: No apparent anesthesia complications

## 2011-04-05 NOTE — Anesthesia Postprocedure Evaluation (Signed)
  Anesthesia Post-op Note Anesthesia Post Note  Patient: Gabrielle Wilkins  Procedure(s) Performed:  CESAREAN SECTION - baby boy 0135  APGAR 7/9  Anesthesia type: Epidural  Patient location: Mother/Baby  Post pain: Pain level controlled  Post assessment: Post-op Vital signs reviewed  Last Vitals:  Filed Vitals:   04/05/11 1157  BP: 113/76  Pulse: 73  Temp: 98.5 F (36.9 C)  Resp: 16    Post vital signs: Reviewed  Level of consciousness: awake  Complications: No apparent anesthesia complications

## 2011-04-05 NOTE — Progress Notes (Signed)
BREASTFEEDING CONSULTATION SERVICES INFORMATION GIVEN TO PATIENT.  BABY NURSING ON NIPPLE AND ASSISTED PATIENT WITH BREAKING SUCTION AND TAKING BABY OFF AND RELATCHING WITH DEEP LATCH.  REVIEWED BREASTFEEDING BASICS.  PATIENT BREASTFED TWO OTHER CHILDREN WITHOUT DIFFICULTY.  ENCOURAGED TO CALL WITH QUESTIONS/CONCERNS.

## 2011-04-05 NOTE — Brief Op Note (Signed)
04/05/2011  2:28 AM  PATIENT:  Gabrielle Wilkins  35 y.o. female  PRE-OPERATIVE DIAGNOSIS:  placenta previa, breech  POST-OPERATIVE DIAGNOSIS:  placenta previa, breech  PROCEDURE:  Procedure(s): PRIMARY LOW TRANSVERSE CESAREAN SECTION--2 LAYER CLOSURE OF UTERUS  SURGEON:  Surgeon(s): Logan Bores    ANESTHESIA:   spinal  ESTIMATED BLOOD LOSS: 800CC  SPECIMEN: PLACENTA  DISPOSITION OF SPECIMEN:  L&D  COUNTS:  YES Findings  Viable female footling breech  Apgars 7,9 Weight 6#14oz.  Normal uterus tubes and ovaries--placenta previa.   PLAN OF CARE: POSTPARTUM FLOOR  PATIENT DISPOSITION:  PACU - hemodynamically stable.

## 2011-04-05 NOTE — Progress Notes (Signed)
Dr. Marvel Plan at bedside discussing C-Section with patient. Procedure explained and consent signed. Pt verbalized understanding.

## 2011-04-05 NOTE — Progress Notes (Signed)
Encounter addended by: Hubbard Robinson on: 04/05/2011 12:32 PM<BR>     Documentation filed: Notes Section, Charges VN

## 2011-04-05 NOTE — Anesthesia Procedure Notes (Signed)
Spinal Block  Patient location during procedure: OR Start time: 04/05/2011 1:16 AM Staffing Anesthesiologist: Idalis Hoelting A. Performed by: anesthesiologist  Preanesthetic Checklist Completed: patient identified, site marked, surgical consent, pre-op evaluation, timeout performed, IV checked, risks and benefits discussed and monitors and equipment checked Spinal Block Patient position: sitting Prep: site prepped and draped and DuraPrep Patient monitoring: heart rate, cardiac monitor, continuous pulse ox and blood pressure Approach: midline Location: L3-4 Injection technique: single-shot Needle Needle type: Sprotte  Needle gauge: 24 G Needle length: 9 cm Needle insertion depth: 4.5 cm Assessment Sensory level: T4 Additional Notes Pt. Tolerated procedure well. Adequate surgical anesthetic level

## 2011-04-05 NOTE — Op Note (Signed)
Preoperative diagnosis   #1 term pregnancy at 37-5/7 weeks                                         #2 placenta previa with bleeding                                         #3 breech presentation  Postoperative diagnosis same  Procedure primary low transverse cesarean section with 2 layer closure of uterus  Surgeon Dr. Paula Compton  Anesthesia spinal  Findings there is a viable female infant in the footling breech presentation Apgars were 7 and 9 weight was 6 lbs. 14 oz. A placenta previa was noted the uterus was otherwise normal except for a small submucosal fibroid 1 cm which was removed through the incision. Ovaries and tubes were normal  Fluids estimated blood loss 800 cc  urine output 250 cc clear urine IV fluids 1700 cc LR  Procedure note After informed consent was obtained from the patient she was taken to the recovery room where an appropriate time out was performed. She then received spinal anesthesia without difficulty and was prepped and draped in the normal sterile fashion in the dorsal supine position with a leftward tilt. A Pfannenstiel skin incision was then made through the skin and carried through to the underlying layer of fascia by sharp dissection and Bovie cautery the fascia was then nicked in the midline and extended laterally. The inferior aspect of the fashion was then grasped with cover clamps and elevated off the rectus muscles. The superior fascia was elevated in a similar fashion. The rectus muscles were then separated in the midline and the peritoneum entered bluntly. Peritoneal incision was then extended both superiorly and inferiorly with careful attention to avoid both bowel and bladder. The bladder flap was then created with Metzenbaum scissors and several large sinuses were noted near the lower uterine segment. These were avoided and a uterine incision made in the lower uterine segment transversely. The uterine cavity itself was entered bluntly and clear fluid  with some blood-tinged was noted. The infant's feet were presenting at the incision and were grasped and gentle traction delivered the baby up to the level of the scapula. The arms were extended upward and were gently rotated across the chest and delivered and the head was delivered in a flexed position. Baby was bulb suctioned and the cord clamped and cut and handed to the pediatricians. The placenta was then expressed spontaneously and the uterine cavity cleared of all clots and debris with a moist lap sponge. The uterine incision was then closed in 2 layers the first a running locked layer of 1-0 chromic and the second an imbricating layer of the same suture. There was an area of bleeding at the lower left angle and just below the incision over the uterine artery. This was controlled with several figure-of-eight sutures of 3-0 Vicryl. Once hemostasis was obtained at this area the gutters were cleared of all clots and debris and the ovaries and tubes inspected and found to be normal. There is no active bleeding so all instruments and sponges were removed from the abdomen as well as the Alexis retractor which had been utilized for visualization. The peritoneum was then closed with the rectus muscles and several interrupted mattress sutures  of 0 Vicryl the fascia was closed with 0 Vicryl in a running fashion and the skin was closed with 3-0 Vicryl in a subcuticular stitch. Again all instrument and sponge counts were correct and the patient was taken to the recovery room in good condition.

## 2011-04-06 LAB — CBC
Hemoglobin: 9.9 g/dL — ABNORMAL LOW (ref 12.0–15.0)
MCH: 30.3 pg (ref 26.0–34.0)
MCHC: 34.4 g/dL (ref 30.0–36.0)
Platelets: 275 10*3/uL (ref 150–400)
RDW: 13.5 % (ref 11.5–15.5)

## 2011-04-06 MED ORDER — BACITRACIN-NEOMYCIN-POLYMYXIN OINTMENT TUBE
TOPICAL_OINTMENT | Freq: Three times a day (TID) | CUTANEOUS | Status: DC
  Administered 2011-04-06 (×3): via TOPICAL
  Filled 2011-04-06: qty 15

## 2011-04-06 NOTE — Progress Notes (Signed)
Subjective: Postpartum Day 1: Cesarean Delivery Patient reports tolerating PO, + flatus and no problems voiding.    Objective: Vital signs in last 24 hours: Temp:  [98.1 F (36.7 C)-98.7 F (37.1 C)] 98.7 F (37.1 C) (08/07 4332) Pulse Rate:  [69-93] 88  (08/07 0633) Resp:  [16-18] 18  (08/07 9518) BP: (104-120)/(68-78) 107/71 mmHg (08/07 0633) SpO2:  [96 %-99 %] 99 % (08/06 2344)  Physical Exam:  General: alert Lochia: appropriate Uterine Fundus: firm Incision: healing well, small blisters to right and left from adhesive on bandage DVT Evaluation: No evidence of DVT seen on physical exam.   Basename 04/06/11 0540 04/05/11 0020  HGB 9.9* 11.6*  HCT 28.8* 33.7*    Assessment/Plan: Status post Cesarean section. Doing well postoperatively.  Continue current care.  Gabrielle Wilkins 04/06/2011, 9:22 AM

## 2011-04-07 MED ORDER — OXYCODONE-ACETAMINOPHEN 5-325 MG PO TABS: 1.0000 | ORAL_TABLET | ORAL | Status: AC | PRN

## 2011-04-07 MED ORDER — IBUPROFEN 600 MG PO TABS: 600.0000 mg | ORAL_TABLET | Freq: Four times a day (QID) | ORAL | Status: AC | PRN

## 2011-04-07 NOTE — Discharge Summary (Signed)
Obstetric Discharge Summary Reason for Admission: Bleeding with placenta previa at 91 5/7 weeks Prenatal Procedures: none Intrapartum Procedures: cesarean: low cervical, transverse Postpartum Procedures: none Complications-Operative and Postpartum: none Hemoglobin  Date Value Range Status  04/06/2011 9.9* 12.0-15.0 (g/dL) Final     HCT  Date Value Range Status  04/06/2011 28.8* 36.0-46.0 (%) Final    Discharge Diagnoses: Term Pregnancy-delivered, Placenta previa and s/p Primary low Transverse C-section  Discharge Information: Date: 04/07/2011 Activity: pelvic rest Diet: routine Medications: Ibuprophen and Percocet Condition: improved Instructions: refer to practice specific booklet Discharge to: home Follow-up Information    Follow up with [name] Make an appointment in 2 weeks.   Contact information:   [address] [place] [phone]          Newborn Data: Live born female  Birth Weight: 6 lb 14 oz (3118 g) APGAR: 7, 9  Home with mother.  Gabrielle Wilkins 04/07/2011, 10:10 AM

## 2011-04-07 NOTE — Progress Notes (Signed)
Subjective: Postpartum Day 2 Cesarean Delivery Patient reports tolerating PO, + flatus and no problems voiding.    Objective: Vital signs in last 24 hours: Temp:  [98 F (36.7 C)] 98 F (36.7 C) (08/08 0615) Pulse Rate:  [78-87] 78  (08/08 0615) Resp:  [18-20] 18  (08/08 0615) BP: (117-134)/(79-90) 134/90 mmHg (08/08 0615)  Physical Exam:  General: alert Lochia: appropriate Uterine Fundus: firm Incision: healing well DVT Evaluation: No evidence of DVT seen on physical exam.   Basename 04/06/11 0540 04/05/11 0020  HGB 9.9* 11.6*  HCT 28.8* 33.7*    Assessment/Plan: Status post Cesarean section. Doing well postoperatively.  Discharge home with standard precautions and return to office in 2 weeks for incision check. Motrin, percocet.  Logan Bores 04/07/2011, 10:06 AM

## 2011-04-09 NOTE — Progress Notes (Signed)
UR chart review completed.  

## 2011-04-12 ENCOUNTER — Inpatient Hospital Stay (HOSPITAL_COMMUNITY)
Admission: RE | Admit: 2011-04-12 | Payer: BC Managed Care – PPO | Source: Ambulatory Visit | Admitting: Obstetrics and Gynecology

## 2011-04-12 ENCOUNTER — Encounter (HOSPITAL_COMMUNITY): Admission: RE | Payer: Self-pay | Source: Ambulatory Visit

## 2011-04-12 SURGERY — Surgical Case
Anesthesia: Choice

## 2011-05-26 ENCOUNTER — Encounter (HOSPITAL_COMMUNITY): Payer: Self-pay | Admitting: Obstetrics and Gynecology

## 2011-07-12 ENCOUNTER — Other Ambulatory Visit: Payer: Self-pay | Admitting: Dermatology

## 2011-08-03 ENCOUNTER — Other Ambulatory Visit: Payer: Self-pay | Admitting: Dermatology

## 2011-10-25 ENCOUNTER — Other Ambulatory Visit: Payer: Self-pay | Admitting: Family Medicine

## 2011-10-25 DIAGNOSIS — H539 Unspecified visual disturbance: Secondary | ICD-10-CM

## 2011-11-01 ENCOUNTER — Ambulatory Visit
Admission: RE | Admit: 2011-11-01 | Discharge: 2011-11-01 | Disposition: A | Discharge status: Home / Self Care | Payer: BC Managed Care – PPO | Source: Ambulatory Visit | Attending: Family Medicine | Admitting: Family Medicine

## 2011-11-01 DIAGNOSIS — H539 Unspecified visual disturbance: Secondary | ICD-10-CM

## 2011-11-01 MED ORDER — GADOBENATE DIMEGLUMINE 529 MG/ML IV SOLN
13.0000 mL | Freq: Once | INTRAVENOUS | Status: AC | PRN
  Administered 2011-11-01: 13 mL via INTRAVENOUS

## 2012-04-27 ENCOUNTER — Other Ambulatory Visit (HOSPITAL_COMMUNITY): Payer: Self-pay | Admitting: *Deleted

## 2012-04-27 MED ORDER — INFLIXIMAB 100 MG IV SOLR: 5.0000 mg/kg | INTRAVENOUS | Status: DC

## 2012-04-28 ENCOUNTER — Encounter (HOSPITAL_COMMUNITY)
Admission: RE | Admit: 2012-04-28 | Discharge: 2012-04-28 | Disposition: A | Discharge status: Home / Self Care | Payer: BC Managed Care – PPO | Source: Ambulatory Visit | Attending: Gastroenterology | Admitting: Gastroenterology

## 2012-04-28 DIAGNOSIS — K5190 Ulcerative colitis, unspecified, without complications: Secondary | ICD-10-CM | POA: Insufficient documentation

## 2012-04-28 DIAGNOSIS — I10 Essential (primary) hypertension: Secondary | ICD-10-CM | POA: Insufficient documentation

## 2012-04-28 MED ORDER — SODIUM CHLORIDE 0.9 % IV SOLN
INTRAVENOUS | Status: DC
  Administered 2012-04-28: 10:00:00 via INTRAVENOUS

## 2012-04-28 MED ORDER — SODIUM CHLORIDE 0.9 % IV SOLN
5.0000 mg/kg | INTRAVENOUS | Status: DC
  Administered 2012-04-28: 300 mg via INTRAVENOUS
  Filled 2012-04-28: qty 30

## 2012-05-11 ENCOUNTER — Other Ambulatory Visit (HOSPITAL_COMMUNITY): Payer: Self-pay | Admitting: *Deleted

## 2012-05-12 ENCOUNTER — Encounter (HOSPITAL_COMMUNITY)
Admission: RE | Admit: 2012-05-12 | Discharge: 2012-05-12 | Disposition: A | Discharge status: Home / Self Care | Payer: BC Managed Care – PPO | Source: Ambulatory Visit | Attending: Gastroenterology | Admitting: Gastroenterology

## 2012-05-12 DIAGNOSIS — I10 Essential (primary) hypertension: Secondary | ICD-10-CM | POA: Insufficient documentation

## 2012-05-12 DIAGNOSIS — K5190 Ulcerative colitis, unspecified, without complications: Secondary | ICD-10-CM | POA: Insufficient documentation

## 2012-05-12 MED ORDER — SODIUM CHLORIDE 0.9 % IV SOLN
INTRAVENOUS | Status: DC
  Administered 2012-05-12: 10:00:00 via INTRAVENOUS

## 2012-05-12 MED ORDER — SODIUM CHLORIDE 0.9 % IV SOLN
5.0000 mg/kg | INTRAVENOUS | Status: AC
  Administered 2012-05-12: 300 mg via INTRAVENOUS
  Filled 2012-05-12: qty 30

## 2012-05-12 NOTE — Progress Notes (Signed)
Pt requested IV therapy because she has had to be stuck multiple times in past visits+

## 2012-06-08 ENCOUNTER — Other Ambulatory Visit (HOSPITAL_COMMUNITY): Payer: Self-pay | Admitting: *Deleted

## 2012-06-09 ENCOUNTER — Encounter (HOSPITAL_COMMUNITY)
Admission: RE | Admit: 2012-06-09 | Discharge: 2012-06-09 | Disposition: A | Discharge status: Home / Self Care | Payer: BC Managed Care – PPO | Source: Ambulatory Visit | Attending: Gastroenterology | Admitting: Gastroenterology

## 2012-06-09 DIAGNOSIS — K5190 Ulcerative colitis, unspecified, without complications: Secondary | ICD-10-CM | POA: Insufficient documentation

## 2012-06-09 DIAGNOSIS — I10 Essential (primary) hypertension: Secondary | ICD-10-CM | POA: Insufficient documentation

## 2012-06-09 MED ORDER — SODIUM CHLORIDE 0.9 % IV SOLN
5.0000 mg/kg | INTRAVENOUS | Status: AC
  Administered 2012-06-09: 300 mg via INTRAVENOUS
  Filled 2012-06-09: qty 30

## 2012-06-09 MED ORDER — SODIUM CHLORIDE 0.9 % IV SOLN
INTRAVENOUS | Status: DC
  Administered 2012-06-09: 10:00:00 via INTRAVENOUS

## 2012-06-23 ENCOUNTER — Encounter (HOSPITAL_COMMUNITY)
Admission: RE | Admit: 2012-06-23 | Discharge: 2012-06-23 | Disposition: A | Discharge status: Home / Self Care | Payer: BC Managed Care – PPO | Source: Ambulatory Visit | Attending: Gastroenterology | Admitting: Gastroenterology

## 2012-06-23 MED ORDER — SODIUM CHLORIDE 0.9 % IV SOLN
5.0000 mg/kg | INTRAVENOUS | Status: AC
  Administered 2012-06-23: 300 mg via INTRAVENOUS
  Filled 2012-06-23: qty 30

## 2012-06-23 MED ORDER — SODIUM CHLORIDE 0.9 % IV SOLN
INTRAVENOUS | Status: DC
  Administered 2012-06-23: 250 mL via INTRAVENOUS

## 2012-08-18 ENCOUNTER — Encounter (HOSPITAL_COMMUNITY)
Admission: RE | Admit: 2012-08-18 | Discharge: 2012-08-18 | Disposition: A | Discharge status: Home / Self Care | Payer: BC Managed Care – PPO | Source: Ambulatory Visit | Attending: Gastroenterology | Admitting: Gastroenterology

## 2012-08-18 DIAGNOSIS — K5190 Ulcerative colitis, unspecified, without complications: Secondary | ICD-10-CM | POA: Insufficient documentation

## 2012-08-18 DIAGNOSIS — I10 Essential (primary) hypertension: Secondary | ICD-10-CM | POA: Insufficient documentation

## 2012-08-18 MED ORDER — SODIUM CHLORIDE 0.9 % IV SOLN: INTRAVENOUS | Status: DC

## 2012-08-18 MED ORDER — SODIUM CHLORIDE 0.9 % IV SOLN
INTRAVENOUS | Status: DC
  Administered 2012-08-18: 10:00:00 via INTRAVENOUS

## 2012-08-18 MED ORDER — SODIUM CHLORIDE 0.9 % IV SOLN
5.0000 mg/kg | INTRAVENOUS | Status: DC
  Administered 2012-08-18: 300 mg via INTRAVENOUS
  Filled 2012-08-18: qty 30

## 2012-08-18 MED ORDER — SODIUM CHLORIDE 0.9 % IV SOLN: 5.0000 mg/kg | INTRAVENOUS | Status: DC

## 2012-10-13 ENCOUNTER — Encounter (HOSPITAL_COMMUNITY): Payer: BC Managed Care – PPO

## 2012-10-13 ENCOUNTER — Other Ambulatory Visit (HOSPITAL_COMMUNITY): Payer: Self-pay | Admitting: *Deleted

## 2012-10-20 ENCOUNTER — Encounter (HOSPITAL_COMMUNITY)
Admission: RE | Admit: 2012-10-20 | Discharge: 2012-10-20 | Disposition: A | Discharge status: Home / Self Care | Payer: BC Managed Care – PPO | Source: Ambulatory Visit | Attending: Gastroenterology | Admitting: Gastroenterology

## 2012-10-20 DIAGNOSIS — K5190 Ulcerative colitis, unspecified, without complications: Secondary | ICD-10-CM | POA: Insufficient documentation

## 2012-10-20 DIAGNOSIS — I10 Essential (primary) hypertension: Secondary | ICD-10-CM | POA: Insufficient documentation

## 2012-10-20 MED ORDER — SODIUM CHLORIDE 0.9 % IV SOLN
5.0000 mg/kg | INTRAVENOUS | Status: AC
  Administered 2012-10-20: 300 mg via INTRAVENOUS
  Filled 2012-10-20: qty 30

## 2012-10-20 MED ORDER — SODIUM CHLORIDE 0.9 % IV SOLN
INTRAVENOUS | Status: AC
  Administered 2012-10-20: 11:00:00 via INTRAVENOUS

## 2012-12-15 ENCOUNTER — Encounter (HOSPITAL_COMMUNITY): Payer: BC Managed Care – PPO

## 2012-12-20 ENCOUNTER — Other Ambulatory Visit (HOSPITAL_COMMUNITY): Payer: Self-pay | Admitting: *Deleted

## 2012-12-22 ENCOUNTER — Encounter (HOSPITAL_COMMUNITY)
Admission: RE | Admit: 2012-12-22 | Discharge: 2012-12-22 | Disposition: A | Discharge status: Home / Self Care | Payer: BC Managed Care – PPO | Source: Ambulatory Visit | Attending: Gastroenterology | Admitting: Gastroenterology

## 2012-12-22 DIAGNOSIS — I10 Essential (primary) hypertension: Secondary | ICD-10-CM | POA: Insufficient documentation

## 2012-12-22 DIAGNOSIS — K5190 Ulcerative colitis, unspecified, without complications: Secondary | ICD-10-CM | POA: Insufficient documentation

## 2012-12-22 MED ORDER — SODIUM CHLORIDE 0.9 % IV SOLN
5.0000 mg/kg | INTRAVENOUS | Status: AC
  Administered 2012-12-22: 300 mg via INTRAVENOUS
  Filled 2012-12-22: qty 30

## 2012-12-22 MED ORDER — SODIUM CHLORIDE 0.9 % IV SOLN
INTRAVENOUS | Status: AC
  Administered 2012-12-22: 09:00:00 via INTRAVENOUS

## 2013-02-23 ENCOUNTER — Encounter (HOSPITAL_COMMUNITY)
Admission: RE | Admit: 2013-02-23 | Discharge: 2013-02-23 | Disposition: A | Discharge status: Home / Self Care | Payer: BC Managed Care – PPO | Source: Ambulatory Visit | Attending: Gastroenterology | Admitting: Gastroenterology

## 2013-02-23 DIAGNOSIS — I10 Essential (primary) hypertension: Secondary | ICD-10-CM | POA: Insufficient documentation

## 2013-02-23 DIAGNOSIS — K5190 Ulcerative colitis, unspecified, without complications: Secondary | ICD-10-CM | POA: Insufficient documentation

## 2013-02-23 MED ORDER — SODIUM CHLORIDE 0.9 % IV SOLN
INTRAVENOUS | Status: AC
  Administered 2013-02-23: 250 mL via INTRAVENOUS

## 2013-02-23 MED ORDER — SODIUM CHLORIDE 0.9 % IV SOLN
5.0000 mg/kg | INTRAVENOUS | Status: AC
  Administered 2013-02-23: 300 mg via INTRAVENOUS
  Filled 2013-02-23: qty 30

## 2013-04-26 ENCOUNTER — Other Ambulatory Visit (HOSPITAL_COMMUNITY): Payer: Self-pay

## 2013-04-27 ENCOUNTER — Encounter (HOSPITAL_COMMUNITY)
Admission: RE | Admit: 2013-04-27 | Discharge: 2013-04-27 | Disposition: A | Discharge status: Home / Self Care | Payer: BC Managed Care – PPO | Source: Ambulatory Visit | Attending: Gastroenterology | Admitting: Gastroenterology

## 2013-04-27 DIAGNOSIS — K5190 Ulcerative colitis, unspecified, without complications: Secondary | ICD-10-CM | POA: Insufficient documentation

## 2013-04-27 MED ORDER — SODIUM CHLORIDE 0.9 % IV SOLN
5.0000 mg/kg | INTRAVENOUS | Status: DC
  Administered 2013-04-27: 300 mg via INTRAVENOUS
  Filled 2013-04-27: qty 30

## 2013-04-27 MED ORDER — SODIUM CHLORIDE 0.9 % IV SOLN
INTRAVENOUS | Status: DC
  Administered 2013-04-27: 10:00:00 via INTRAVENOUS

## 2013-05-25 ENCOUNTER — Other Ambulatory Visit: Payer: Self-pay | Admitting: Dermatology

## 2013-06-21 ENCOUNTER — Other Ambulatory Visit (HOSPITAL_COMMUNITY): Payer: Self-pay | Admitting: *Deleted

## 2013-06-22 ENCOUNTER — Encounter (HOSPITAL_COMMUNITY)
Admission: RE | Admit: 2013-06-22 | Discharge: 2013-06-22 | Disposition: A | Discharge status: Home / Self Care | Payer: BC Managed Care – PPO | Source: Ambulatory Visit | Attending: Gastroenterology | Admitting: Gastroenterology

## 2013-06-22 DIAGNOSIS — K5190 Ulcerative colitis, unspecified, without complications: Secondary | ICD-10-CM | POA: Insufficient documentation

## 2013-06-22 MED ORDER — SODIUM CHLORIDE 0.9 % IV SOLN
INTRAVENOUS | Status: DC
  Administered 2013-06-22: 09:00:00 via INTRAVENOUS

## 2013-06-22 MED ORDER — SODIUM CHLORIDE 0.9 % IV SOLN
5.0000 mg/kg | INTRAVENOUS | Status: DC
  Administered 2013-06-22: 300 mg via INTRAVENOUS
  Filled 2013-06-22: qty 30

## 2013-07-24 ENCOUNTER — Other Ambulatory Visit (HOSPITAL_COMMUNITY): Payer: Self-pay | Admitting: *Deleted

## 2013-07-30 ENCOUNTER — Encounter (HOSPITAL_COMMUNITY)
Admission: RE | Admit: 2013-07-30 | Discharge: 2013-07-30 | Disposition: A | Discharge status: Home / Self Care | Payer: BC Managed Care – PPO | Source: Ambulatory Visit | Attending: Gastroenterology | Admitting: Gastroenterology

## 2013-07-30 DIAGNOSIS — K5190 Ulcerative colitis, unspecified, without complications: Secondary | ICD-10-CM | POA: Insufficient documentation

## 2013-07-30 MED ORDER — SODIUM CHLORIDE 0.9 % IV SOLN: INTRAVENOUS | Status: DC

## 2013-07-30 MED ORDER — SODIUM CHLORIDE 0.9 % IV SOLN
10.0000 mg/kg | INTRAVENOUS | Status: DC
  Administered 2013-07-30: 600 mg via INTRAVENOUS
  Filled 2013-07-30: qty 60

## 2013-08-17 ENCOUNTER — Encounter (HOSPITAL_COMMUNITY): Payer: BC Managed Care – PPO

## 2013-09-25 ENCOUNTER — Encounter (HOSPITAL_COMMUNITY)
Admission: RE | Admit: 2013-09-25 | Discharge: 2013-09-25 | Disposition: A | Discharge status: Home / Self Care | Payer: BC Managed Care – PPO | Source: Ambulatory Visit | Attending: Gastroenterology | Admitting: Gastroenterology

## 2013-09-25 DIAGNOSIS — K5190 Ulcerative colitis, unspecified, without complications: Secondary | ICD-10-CM | POA: Insufficient documentation

## 2013-09-25 MED ORDER — SODIUM CHLORIDE 0.9 % IV SOLN: Freq: Once | INTRAVENOUS | Status: DC

## 2013-09-25 MED ORDER — SODIUM CHLORIDE 0.9 % IV SOLN
10.0000 mg/kg | INTRAVENOUS | Status: AC
  Administered 2013-09-25: 600 mg via INTRAVENOUS
  Filled 2013-09-25: qty 60

## 2013-10-08 ENCOUNTER — Other Ambulatory Visit: Payer: Self-pay | Admitting: Family Medicine

## 2013-10-08 ENCOUNTER — Other Ambulatory Visit (HOSPITAL_COMMUNITY)
Admission: RE | Admit: 2013-10-08 | Discharge: 2013-10-08 | Disposition: A | Discharge status: Home / Self Care | Payer: BC Managed Care – PPO | Source: Ambulatory Visit | Attending: Family Medicine | Admitting: Family Medicine

## 2013-10-08 DIAGNOSIS — Z01419 Encounter for gynecological examination (general) (routine) without abnormal findings: Secondary | ICD-10-CM | POA: Insufficient documentation

## 2013-11-19 ENCOUNTER — Other Ambulatory Visit (HOSPITAL_COMMUNITY): Payer: Self-pay | Admitting: *Deleted

## 2013-11-20 ENCOUNTER — Encounter (HOSPITAL_COMMUNITY)
Admission: RE | Admit: 2013-11-20 | Discharge: 2013-11-20 | Disposition: A | Discharge status: Home / Self Care | Payer: BC Managed Care – PPO | Source: Ambulatory Visit | Attending: Gastroenterology | Admitting: Gastroenterology

## 2013-11-20 DIAGNOSIS — K5190 Ulcerative colitis, unspecified, without complications: Secondary | ICD-10-CM | POA: Insufficient documentation

## 2013-11-20 MED ORDER — SODIUM CHLORIDE 0.9 % IV SOLN
INTRAVENOUS | Status: DC
  Administered 2013-11-20: 09:00:00 via INTRAVENOUS

## 2013-11-20 MED ORDER — SODIUM CHLORIDE 0.9 % IV SOLN
10.0000 mg/kg | INTRAVENOUS | Status: DC
  Administered 2013-11-20: 600 mg via INTRAVENOUS
  Filled 2013-11-20: qty 60

## 2014-01-14 ENCOUNTER — Other Ambulatory Visit (HOSPITAL_COMMUNITY): Payer: Self-pay | Admitting: *Deleted

## 2014-01-15 ENCOUNTER — Encounter (HOSPITAL_COMMUNITY)
Admission: RE | Admit: 2014-01-15 | Discharge: 2014-01-15 | Disposition: A | Discharge status: Home / Self Care | Payer: BC Managed Care – PPO | Source: Ambulatory Visit | Attending: Gastroenterology | Admitting: Gastroenterology

## 2014-01-15 DIAGNOSIS — K5190 Ulcerative colitis, unspecified, without complications: Secondary | ICD-10-CM | POA: Insufficient documentation

## 2014-01-15 MED ORDER — SODIUM CHLORIDE 0.9 % IV SOLN
10.0000 mg/kg | INTRAVENOUS | Status: DC
  Administered 2014-01-15: 600 mg via INTRAVENOUS
  Filled 2014-01-15: qty 60

## 2014-01-15 MED ORDER — SODIUM CHLORIDE 0.9 % IV SOLN
INTRAVENOUS | Status: DC
  Administered 2014-01-15: 250 mL via INTRAVENOUS

## 2014-03-12 ENCOUNTER — Encounter (HOSPITAL_COMMUNITY)
Admission: RE | Admit: 2014-03-12 | Discharge: 2014-03-12 | Disposition: A | Discharge status: Home / Self Care | Payer: BC Managed Care – PPO | Source: Ambulatory Visit | Attending: Gastroenterology | Admitting: Gastroenterology

## 2014-03-12 ENCOUNTER — Other Ambulatory Visit (HOSPITAL_COMMUNITY): Payer: Self-pay | Admitting: *Deleted

## 2014-03-12 DIAGNOSIS — K5120 Ulcerative (chronic) proctitis without complications: Secondary | ICD-10-CM | POA: Insufficient documentation

## 2014-03-12 MED ORDER — SODIUM CHLORIDE 0.9 % IV SOLN
10.0000 mg/kg | INTRAVENOUS | Status: DC
  Administered 2014-03-12: 600 mg via INTRAVENOUS
  Filled 2014-03-12: qty 60

## 2014-03-12 MED ORDER — SODIUM CHLORIDE 0.9 % IV SOLN
INTRAVENOUS | Status: DC
  Administered 2014-03-12: 250 mL via INTRAVENOUS

## 2014-03-14 DIAGNOSIS — K5120 Ulcerative (chronic) proctitis without complications: Secondary | ICD-10-CM | POA: Insufficient documentation

## 2014-04-23 ENCOUNTER — Encounter (HOSPITAL_COMMUNITY)
Admission: RE | Admit: 2014-04-23 | Discharge: 2014-04-23 | Disposition: A | Discharge status: Home / Self Care | Payer: BC Managed Care – PPO | Source: Ambulatory Visit | Attending: Gastroenterology | Admitting: Gastroenterology

## 2014-04-23 DIAGNOSIS — K5120 Ulcerative (chronic) proctitis without complications: Secondary | ICD-10-CM | POA: Insufficient documentation

## 2014-04-23 MED ORDER — SODIUM CHLORIDE 0.9 % IV SOLN
INTRAVENOUS | Status: DC
  Administered 2014-04-23: 09:00:00 via INTRAVENOUS

## 2014-04-23 MED ORDER — SODIUM CHLORIDE 0.9 % IV SOLN
10.0000 mg/kg | INTRAVENOUS | Status: DC
  Administered 2014-04-23: 600 mg via INTRAVENOUS
  Filled 2014-04-23: qty 60

## 2014-05-07 ENCOUNTER — Encounter (HOSPITAL_COMMUNITY): Payer: BC Managed Care – PPO

## 2014-06-05 ENCOUNTER — Other Ambulatory Visit (HOSPITAL_COMMUNITY): Payer: Self-pay | Admitting: *Deleted

## 2014-06-06 ENCOUNTER — Encounter (HOSPITAL_COMMUNITY)
Admission: RE | Admit: 2014-06-06 | Discharge: 2014-06-06 | Disposition: A | Discharge status: Home / Self Care | Payer: BC Managed Care – PPO | Source: Ambulatory Visit | Attending: Gastroenterology | Admitting: Gastroenterology

## 2014-06-06 DIAGNOSIS — K5190 Ulcerative colitis, unspecified, without complications: Secondary | ICD-10-CM | POA: Diagnosis not present

## 2014-06-06 MED ORDER — SODIUM CHLORIDE 0.9 % IV SOLN
5.0000 mg/kg | INTRAVENOUS | Status: DC
  Administered 2014-06-06: 300 mg via INTRAVENOUS
  Filled 2014-06-06: qty 30

## 2014-06-06 MED ORDER — SODIUM CHLORIDE 0.9 % IV SOLN
INTRAVENOUS | Status: DC
  Administered 2014-06-06: 09:00:00 via INTRAVENOUS

## 2014-07-01 ENCOUNTER — Encounter (HOSPITAL_COMMUNITY): Payer: Self-pay | Admitting: Obstetrics and Gynecology

## 2014-07-18 ENCOUNTER — Other Ambulatory Visit (HOSPITAL_COMMUNITY): Payer: Self-pay | Admitting: *Deleted

## 2014-07-19 ENCOUNTER — Ambulatory Visit (HOSPITAL_COMMUNITY)
Admission: RE | Admit: 2014-07-19 | Discharge: 2014-07-19 | Disposition: A | Discharge status: Home / Self Care | Payer: BC Managed Care – PPO | Source: Ambulatory Visit | Attending: Gastroenterology | Admitting: Gastroenterology

## 2014-07-19 DIAGNOSIS — K5190 Ulcerative colitis, unspecified, without complications: Secondary | ICD-10-CM | POA: Insufficient documentation

## 2014-07-19 MED ORDER — SODIUM CHLORIDE 0.9 % IV SOLN
5.0000 mg/kg | INTRAVENOUS | Status: DC
  Administered 2014-07-19: 300 mg via INTRAVENOUS
  Filled 2014-07-19: qty 30

## 2014-07-19 MED ORDER — SODIUM CHLORIDE 0.9 % IV SOLN
INTRAVENOUS | Status: DC
  Administered 2014-07-19: 250 mL via INTRAVENOUS

## 2014-09-04 ENCOUNTER — Ambulatory Visit (HOSPITAL_COMMUNITY)
Admission: RE | Admit: 2014-09-04 | Discharge: 2014-09-04 | Disposition: A | Discharge status: Home / Self Care | Payer: BC Managed Care – PPO | Source: Ambulatory Visit | Attending: Gastroenterology | Admitting: Gastroenterology

## 2014-09-04 DIAGNOSIS — K5190 Ulcerative colitis, unspecified, without complications: Secondary | ICD-10-CM | POA: Insufficient documentation

## 2014-09-04 MED ORDER — SODIUM CHLORIDE 0.9 % IV SOLN
INTRAVENOUS | Status: AC
  Administered 2014-09-04: 09:00:00 via INTRAVENOUS

## 2014-09-04 MED ORDER — SODIUM CHLORIDE 0.9 % IV SOLN
5.0000 mg/kg | INTRAVENOUS | Status: AC
  Administered 2014-09-04: 300 mg via INTRAVENOUS
  Filled 2014-09-04: qty 30

## 2014-10-17 ENCOUNTER — Other Ambulatory Visit (HOSPITAL_COMMUNITY): Payer: Self-pay | Admitting: *Deleted

## 2014-10-18 ENCOUNTER — Ambulatory Visit (HOSPITAL_COMMUNITY)
Admission: RE | Admit: 2014-10-18 | Discharge: 2014-10-18 | Disposition: A | Discharge status: Home / Self Care | Payer: BC Managed Care – PPO | Source: Ambulatory Visit | Attending: Gastroenterology | Admitting: Gastroenterology

## 2014-10-18 DIAGNOSIS — K5190 Ulcerative colitis, unspecified, without complications: Secondary | ICD-10-CM | POA: Insufficient documentation

## 2014-10-18 MED ORDER — SODIUM CHLORIDE 0.9 % IV SOLN
5.0000 mg/kg | INTRAVENOUS | Status: DC
  Administered 2014-10-18: 300 mg via INTRAVENOUS
  Filled 2014-10-18: qty 30

## 2014-10-18 MED ORDER — SODIUM CHLORIDE 0.9 % IV SOLN
INTRAVENOUS | Status: DC
  Administered 2014-10-18: 250 mL via INTRAVENOUS

## 2014-11-29 ENCOUNTER — Encounter (HOSPITAL_COMMUNITY): Payer: BC Managed Care – PPO

## 2014-12-03 ENCOUNTER — Encounter (HOSPITAL_COMMUNITY)
Admission: RE | Admit: 2014-12-03 | Discharge: 2014-12-03 | Disposition: A | Discharge status: Home / Self Care | Payer: BC Managed Care – PPO | Source: Ambulatory Visit | Attending: Gastroenterology | Admitting: Gastroenterology

## 2014-12-03 DIAGNOSIS — K5190 Ulcerative colitis, unspecified, without complications: Secondary | ICD-10-CM | POA: Insufficient documentation

## 2014-12-03 MED ORDER — SODIUM CHLORIDE 0.9 % IV SOLN
INTRAVENOUS | Status: DC
  Administered 2014-12-03: 08:00:00 via INTRAVENOUS

## 2014-12-03 MED ORDER — SODIUM CHLORIDE 0.9 % IV SOLN
5.0000 mg/kg | INTRAVENOUS | Status: DC
  Administered 2014-12-03: 300 mg via INTRAVENOUS
  Filled 2014-12-03: qty 30

## 2015-01-16 ENCOUNTER — Other Ambulatory Visit (HOSPITAL_COMMUNITY): Payer: Self-pay | Admitting: *Deleted

## 2015-01-17 ENCOUNTER — Encounter (HOSPITAL_COMMUNITY)
Admission: RE | Admit: 2015-01-17 | Discharge: 2015-01-17 | Disposition: A | Discharge status: Home / Self Care | Payer: BC Managed Care – PPO | Source: Ambulatory Visit | Attending: Gastroenterology | Admitting: Gastroenterology

## 2015-01-17 DIAGNOSIS — K5120 Ulcerative (chronic) proctitis without complications: Secondary | ICD-10-CM | POA: Diagnosis not present

## 2015-01-17 MED ORDER — SODIUM CHLORIDE 0.9 % IV SOLN
INTRAVENOUS | Status: DC
  Administered 2015-01-17: 09:00:00 via INTRAVENOUS

## 2015-01-17 MED ORDER — SODIUM CHLORIDE 0.9 % IV SOLN
5.0000 mg/kg | INTRAVENOUS | Status: DC
  Administered 2015-01-17: 300 mg via INTRAVENOUS
  Filled 2015-01-17: qty 30

## 2015-03-05 ENCOUNTER — Encounter (HOSPITAL_COMMUNITY)
Admission: RE | Admit: 2015-03-05 | Discharge: 2015-03-05 | Disposition: A | Discharge status: Home / Self Care | Payer: BC Managed Care – PPO | Source: Ambulatory Visit | Attending: Gastroenterology | Admitting: Gastroenterology

## 2015-03-05 DIAGNOSIS — K5120 Ulcerative (chronic) proctitis without complications: Secondary | ICD-10-CM | POA: Diagnosis not present

## 2015-03-05 MED ORDER — SODIUM CHLORIDE 0.9 % IV SOLN
INTRAVENOUS | Status: DC
  Administered 2015-03-05: 12:00:00 via INTRAVENOUS

## 2015-03-05 MED ORDER — SODIUM CHLORIDE 0.9 % IV SOLN
5.0000 mg/kg | INTRAVENOUS | Status: DC
  Administered 2015-03-05: 300 mg via INTRAVENOUS
  Filled 2015-03-05: qty 30

## 2015-04-15 ENCOUNTER — Other Ambulatory Visit (HOSPITAL_COMMUNITY): Payer: Self-pay | Admitting: *Deleted

## 2015-04-16 ENCOUNTER — Ambulatory Visit (HOSPITAL_COMMUNITY)
Admission: RE | Admit: 2015-04-16 | Discharge: 2015-04-16 | Disposition: A | Discharge status: Home / Self Care | Payer: BC Managed Care – PPO | Source: Ambulatory Visit | Attending: Gastroenterology | Admitting: Gastroenterology

## 2015-04-16 DIAGNOSIS — K5120 Ulcerative (chronic) proctitis without complications: Secondary | ICD-10-CM | POA: Diagnosis not present

## 2015-04-16 MED ORDER — SODIUM CHLORIDE 0.9 % IV SOLN: INTRAVENOUS | Status: DC

## 2015-04-16 MED ORDER — SODIUM CHLORIDE 0.9 % IV SOLN
5.0000 mg/kg | INTRAVENOUS | Status: DC
  Administered 2015-04-16: 300 mg via INTRAVENOUS
  Filled 2015-04-16: qty 30

## 2015-05-30 ENCOUNTER — Other Ambulatory Visit (HOSPITAL_COMMUNITY): Payer: Self-pay | Admitting: *Deleted

## 2015-06-02 ENCOUNTER — Ambulatory Visit (HOSPITAL_COMMUNITY)
Admission: RE | Admit: 2015-06-02 | Discharge: 2015-06-02 | Disposition: A | Discharge status: Home / Self Care | Payer: BC Managed Care – PPO | Source: Ambulatory Visit | Attending: Gastroenterology | Admitting: Gastroenterology

## 2015-06-02 DIAGNOSIS — K5120 Ulcerative (chronic) proctitis without complications: Secondary | ICD-10-CM | POA: Insufficient documentation

## 2015-06-02 MED ORDER — SODIUM CHLORIDE 0.9 % IV SOLN
INTRAVENOUS | Status: DC
  Administered 2015-06-02: 08:00:00 via INTRAVENOUS

## 2015-06-02 MED ORDER — SODIUM CHLORIDE 0.9 % IV SOLN
5.0000 mg/kg | INTRAVENOUS | Status: DC
  Administered 2015-06-02: 300 mg via INTRAVENOUS
  Filled 2015-06-02: qty 30

## 2015-07-17 ENCOUNTER — Other Ambulatory Visit (HOSPITAL_COMMUNITY): Payer: Self-pay

## 2015-07-18 ENCOUNTER — Ambulatory Visit (HOSPITAL_COMMUNITY)
Admission: RE | Admit: 2015-07-18 | Discharge: 2015-07-18 | Disposition: A | Discharge status: Home / Self Care | Payer: BC Managed Care – PPO | Source: Ambulatory Visit | Attending: Gastroenterology | Admitting: Gastroenterology

## 2015-07-18 DIAGNOSIS — K5120 Ulcerative (chronic) proctitis without complications: Secondary | ICD-10-CM | POA: Insufficient documentation

## 2015-07-18 MED ORDER — SODIUM CHLORIDE 0.9 % IV SOLN
5.0000 mg/kg | INTRAVENOUS | Status: DC
  Administered 2015-07-18: 300 mg via INTRAVENOUS
  Filled 2015-07-18: qty 30

## 2015-07-18 MED ORDER — SODIUM CHLORIDE 0.9 % IV SOLN
INTRAVENOUS | Status: DC
  Administered 2015-07-18: 08:00:00 via INTRAVENOUS

## 2015-07-29 ENCOUNTER — Other Ambulatory Visit: Payer: Self-pay | Admitting: Family Medicine

## 2015-07-29 DIAGNOSIS — R198 Other specified symptoms and signs involving the digestive system and abdomen: Secondary | ICD-10-CM

## 2015-08-02 ENCOUNTER — Ambulatory Visit (INDEPENDENT_AMBULATORY_CARE_PROVIDER_SITE_OTHER): Payer: BC Managed Care – PPO | Admitting: Physician Assistant

## 2015-08-02 VITALS — BP 121/79 | HR 90 | Temp 98.2°F | Resp 16 | Ht 63.25 in | Wt 127.4 lb

## 2015-08-02 DIAGNOSIS — J209 Acute bronchitis, unspecified: Secondary | ICD-10-CM

## 2015-08-02 DIAGNOSIS — H9202 Otalgia, left ear: Secondary | ICD-10-CM

## 2015-08-02 DIAGNOSIS — H6982 Other specified disorders of Eustachian tube, left ear: Secondary | ICD-10-CM

## 2015-08-02 DIAGNOSIS — R062 Wheezing: Secondary | ICD-10-CM

## 2015-08-02 MED ORDER — AZITHROMYCIN 250 MG PO TABS: ORAL_TABLET | ORAL | Status: AC

## 2015-08-02 MED ORDER — ALBUTEROL SULFATE HFA 108 (90 BASE) MCG/ACT IN AERS: 2.0000 | INHALATION_SPRAY | Freq: Four times a day (QID) | RESPIRATORY_TRACT | Status: DC | PRN

## 2015-08-02 MED ORDER — ANTIPYRINE-BENZOCAINE 5.4-1.4 % OT SOLN: 3.0000 [drp] | OTIC | Status: DC | PRN

## 2015-08-02 MED ORDER — TRAMADOL HCL 50 MG PO TABS: 50.0000 mg | ORAL_TABLET | Freq: Three times a day (TID) | ORAL | Status: DC | PRN

## 2015-08-02 MED ORDER — IPRATROPIUM BROMIDE 0.06 % NA SOLN: 2.0000 | Freq: Three times a day (TID) | NASAL | Status: DC

## 2015-08-02 MED ORDER — GUAIFENESIN ER 1200 MG PO TB12: 1.0000 | ORAL_TABLET | Freq: Two times a day (BID) | ORAL | Status: AC

## 2015-08-02 NOTE — Progress Notes (Signed)
Gabrielle Wilkins  MRN: [id number] DOB: 1975/12/05  Subjective:  Pt presents to clinic with left ear pain that started last pm.  She has had a cold for about 2 weeks and she feels like it is not getting much better - she has a cough with thick mucus that is coming from deep in her chest and she has some nasal congestion.  She is having some wheezing at times but no SOB.  She was doing ok until she started with the ear pain, though she has had muffled hearing over the last several days.  She has been dealing with a UC flair since about the end of October and she has been tapering off her prednisone and is at 63m daily for the last week or so.  She has been using motrin but that is making her UC worse.  She sees Dr HBenson Norwayand she has an appointment end of December.  Patient Active Problem List   Diagnosis Date Noted  . Chronic ulcerative proctitis ([place]appen 03/14/2014    Current Outpatient Prescriptions on File Prior to Visit  Medication Sig Dispense Refill  . lisinopril-hydrochlorothiazide (PRINZIDE,ZESTORETIC) 10-12.5 MG per tablet Take by mouth daily.    . Calcium 500 MG CHEW Chew 2 tablets by mouth daily. Patient takes OTC chocolate chewable calcium.     . predniSONE (DELTASONE) 20 MG tablet Take 40 mg by mouth daily with breakfast.     No current facility-administered medications on file prior to visit.    Allergies  Allergen Reactions  . Ceclor [Cefaclor] Hives    Patient said she can tolerate penicillin.  Hasn't had Keflex.     Review of Systems  HENT: Positive for congestion, ear pain (left) and rhinorrhea (thick mucus). Negative for sinus pressure.   Respiratory: Positive for cough and wheezing. Negative for shortness of breath.        H/o childhood asthma  Neurological: Negative for headaches.   Objective:  BP 121/79 mmHg  Pulse 90  Temp(Src) 98.2 F (36.8 C) (Oral)  Resp 16  Ht 5' 3.25" (1.607 m)  Wt 127 lb 6.4 oz (57.788 kg)  BMI 22.38 kg/m2  SpO2 98%  LMP 07/31/2015   Breastfeeding? No  Physical Exam  Constitutional: She is oriented to person, place, and time and well-developed, well-nourished, and in no distress.  HENT:  Head: Normocephalic and atraumatic.  Right Ear: Hearing, external ear and ear canal normal. Tympanic membrane is bulging. A middle ear effusion (serous fluid) is present.  Left Ear: Hearing, external ear and ear canal normal. Tympanic membrane is erythematous and bulging. A middle ear effusion (serous fluid) is present.  Nose: Nose normal.  Mouth/Throat: Uvula is midline, oropharynx is clear and moist and mucous membranes are normal.  Eyes: Conjunctivae are normal.  Neck: Normal range of motion.  Cardiovascular: Normal rate, regular rhythm and normal heart sounds.   No murmur heard. Pulmonary/Chest: Effort normal. She has wheezes (left side base wheezing which increases with forced expiration).  Neurological: She is alert and oriented to person, place, and time. Gait normal.  Skin: Skin is warm and dry.  Psychiatric: Mood, memory, affect and judgment normal.  Vitals reviewed.   Assessment and Plan :  Acute bronchitis, unspecified organism - Plan: azithromycin (ZITHROMAX Z-PAK) 250 MG tablet, Guaifenesin (MUCINEX MAXIMUM STRENGTH) 1200 MG TB12  ETD (eustachian tube dysfunction), left - Plan: ipratropium (ATROVENT) 0.06 % nasal spray  Wheezing - Plan: albuterol (PROVENTIL HFA;VENTOLIN HFA) 108 (90 BASE) MCG/ACT inhaler  Ear  pain, left - Plan: antipyrine-benzocaine (AURALGAN) otic solution, traMADol (ULTRAM) 50 MG tablet   Symptomatic care for ETD as there is no obvious ear infection currently.  We will be mindful of her UC and try as many local medications as possible but due to the timing of the cough and the productive sputum and the wheezing we decided that abx treatment is worth the risk of increase in her UC as well as use of the mucinex to help thin the mucus to prevent increase risk of possible PNA development.  We will treat  her pain with topical medications but if that does not help I gave patients some ultram because the motrin that she has been taking is upsetting her UC.  Windell Hummingbird PA-C  [facility] 08/02/2015 3:35 PM

## 2015-08-04 ENCOUNTER — Ambulatory Visit
Admission: RE | Admit: 2015-08-04 | Discharge: 2015-08-04 | Disposition: A | Discharge status: Home / Self Care | Payer: BC Managed Care – PPO | Source: Ambulatory Visit | Attending: Family Medicine | Admitting: Family Medicine

## 2015-08-04 DIAGNOSIS — R198 Other specified symptoms and signs involving the digestive system and abdomen: Secondary | ICD-10-CM

## 2015-08-04 NOTE — Progress Notes (Signed)
  Medical screening examination/treatment/procedure(s) were performed by non-physician practitioner and as supervising physician I was immediately available for consultation/collaboration.     

## 2015-08-29 ENCOUNTER — Ambulatory Visit (HOSPITAL_COMMUNITY)
Admission: RE | Admit: 2015-08-29 | Discharge: 2015-08-29 | Disposition: A | Discharge status: Home / Self Care | Payer: BC Managed Care – PPO | Source: Ambulatory Visit | Attending: Gastroenterology | Admitting: Gastroenterology

## 2015-08-29 DIAGNOSIS — K5190 Ulcerative colitis, unspecified, without complications: Secondary | ICD-10-CM | POA: Diagnosis present

## 2015-08-29 MED ORDER — SODIUM CHLORIDE 0.9 % IV SOLN
5.0000 mg/kg | Freq: Once | INTRAVENOUS | Status: AC
  Administered 2015-08-29: 300 mg via INTRAVENOUS
  Filled 2015-08-29: qty 30

## 2015-09-05 ENCOUNTER — Inpatient Hospital Stay (HOSPITAL_COMMUNITY)
Admission: AD | Admit: 2015-09-05 | Discharge: 2015-09-09 | DRG: 387 | Disposition: A | Discharge status: Home / Self Care | Payer: BC Managed Care – PPO | Source: Ambulatory Visit | Attending: Gastroenterology | Admitting: Gastroenterology

## 2015-09-05 DIAGNOSIS — K51211 Ulcerative (chronic) proctitis with rectal bleeding: Principal | ICD-10-CM | POA: Diagnosis present

## 2015-09-05 DIAGNOSIS — I10 Essential (primary) hypertension: Secondary | ICD-10-CM | POA: Diagnosis present

## 2015-09-05 DIAGNOSIS — F329 Major depressive disorder, single episode, unspecified: Secondary | ICD-10-CM | POA: Diagnosis present

## 2015-09-05 DIAGNOSIS — K921 Melena: Secondary | ICD-10-CM | POA: Diagnosis present

## 2015-09-05 LAB — COMPREHENSIVE METABOLIC PANEL
ALT: 12 U/L — ABNORMAL LOW (ref 14–54)
AST: 21 U/L (ref 15–41)
Albumin: 4.3 g/dL (ref 3.5–5.0)
Alkaline Phosphatase: 50 U/L (ref 38–126)
Anion gap: 12 (ref 5–15)
BUN: 9 mg/dL (ref 6–20)
CHLORIDE: 105 mmol/L (ref 101–111)
CO2: 23 mmol/L (ref 22–32)
Calcium: 9.2 mg/dL (ref 8.9–10.3)
Creatinine, Ser: 0.68 mg/dL (ref 0.44–1.00)
Glucose, Bld: 95 mg/dL (ref 65–99)
POTASSIUM: 4.2 mmol/L (ref 3.5–5.1)
SODIUM: 140 mmol/L (ref 135–145)
Total Bilirubin: 0.6 mg/dL (ref 0.3–1.2)
Total Protein: 7.7 g/dL (ref 6.5–8.1)

## 2015-09-05 LAB — CBC
HEMATOCRIT: 37.5 % (ref 36.0–46.0)
HEMOGLOBIN: 12.4 g/dL (ref 12.0–15.0)
MCH: 27.9 pg (ref 26.0–34.0)
MCHC: 33.1 g/dL (ref 30.0–36.0)
MCV: 84.5 fL (ref 78.0–100.0)
Platelets: 419 10*3/uL — ABNORMAL HIGH (ref 150–400)
RBC: 4.44 MIL/uL (ref 3.87–5.11)
RDW: 15.2 % (ref 11.5–15.5)
WBC: 11.9 10*3/uL — AB (ref 4.0–10.5)

## 2015-09-05 LAB — C-REACTIVE PROTEIN: CRP: <0.5 mg/dL (ref <1.0)

## 2015-09-05 MED ORDER — AZATHIOPRINE 50 MG PO TABS
125.0000 mg | ORAL_TABLET | Freq: Every day | ORAL | Status: DC
  Administered 2015-09-06 – 2015-09-09 (×4): 125 mg via ORAL
  Filled 2015-09-05 (×5): qty 3

## 2015-09-05 MED ORDER — METHYLPREDNISOLONE SODIUM SUCC 40 MG IJ SOLR
40.0000 mg | Freq: Two times a day (BID) | INTRAMUSCULAR | Status: DC
  Administered 2015-09-05 – 2015-09-09 (×8): 40 mg via INTRAVENOUS
  Filled 2015-09-05 (×8): qty 1

## 2015-09-05 MED ORDER — MESALAMINE 1.2 G PO TBEC
4.8000 g | DELAYED_RELEASE_TABLET | Freq: Every day | ORAL | Status: DC
  Administered 2015-09-06 – 2015-09-09 (×4): 4.8 g via ORAL
  Filled 2015-09-05 (×5): qty 4

## 2015-09-05 NOTE — H&P (Signed)
Gabrielle Wilkins HPI:  This is a 40 year old female who is well-known to me for her history of an ulcerative proctitis.  She was diagnosed several years ago and she was started on 5-ASA medications, but she did not respond.  Steroids were used both prednisone and budesonide with some benefit in the past.  Her treatment was escalated up to AZA and she had a very good response.  The major side effect was the thinning of her hair.  Unfortunately her symptoms recurred on AZA and then the decision for Remicade was initiated.  This resulted in an excellent response and she has done well for the past couple of years until now, but during that time her dosing regimen had to be dropped down to every 6 weeks at 10 mg/kg.  A second opinion consultation was obtained from [facility] and they recommended the same treatment.  With the dose escalation and the increased frequency of Remicade she was able to achieve remission again.  Since October she has had a continuous flare.  Restarting her prednisone as well as maintaining her Lialda did not bring about a remission.  Budesonide was tried and most recently a resumption of her AZA without any success.  Last Friday she had her usual Remicade infusion and her hematochezia stopped, but this past Monday her bleeding recurred.  She averages 7-8 watery bowel movements per day.  No reports of any fever.  Overall she feels unwell.  Past Medical History  Diagnosis Date  . Hypertension     Chronic Hypertension  . Ulcerative colitis   . Depression   . Pregnancy induced hypertension   . Migraine, ophthalmoplegic   . Ulcerative colitis [facility])     Past Surgical History  Procedure Laterality Date  . Cholecystectomy  2008  . Dilation and curettage of uterus    . Cesarean section  04/05/2011    Procedure: CESAREAN SECTION;  Surgeon: Logan Bores;  Location: [facility];  Service: Gynecology;  Laterality: N/A;  baby boy 62  APGAR 7/9  . Colonoscopy N/A     Family History  Problem  Relation Age of Onset  . Pancreatic cancer Mother   . Dementia Mother   . Hypertension Mother   . Heart attack Father   . Alcoholism Father   . Cirrhosis Father   . Hypertension Brother   . Arthritis/Rheumatoid Brother   . Hypertension Brother   . HIV Brother   . Bipolar disorder Brother   . Hypertension Brother     Social History:  reports that she has never smoked. She has never used smokeless tobacco. She reports that she drinks about 1.8 oz of alcohol per week. She reports that she does not use illicit drugs.  Allergies:  Allergies  Allergen Reactions  . Ceclor [Cefaclor] Hives    Patient said she can tolerate penicillin.  Hasn't had Keflex.     Medications:  Scheduled: . [START ON 09/06/2015] mesalamine  4.8 g Oral Q breakfast  . methylPREDNISolone (SOLU-MEDROL) injection  40 mg Intravenous Q12H   Continuous:   No results found for this or any previous visit (from the past 24 hour(s)).   No results found.  ROS:  As stated above in the HPI otherwise negative.  Blood pressure 135/97, pulse 82, temperature 98.4 F (36.9 C), temperature source Oral, resp. rate 20, SpO2 100 %.    PE: Gen: NAD, Alert and Oriented HEENT:  [place]/AT, EOMI Neck: Supple, no LAD Lungs: CTA Bilaterally CV: RRR without  M/G/R ABM: Soft, NTND, +BS Ext: No C/C/E  Assessment/Plan: 1) Ulcerative Proctitis Flare. 2) Hematochezia. 3) Diarrhea.    I will initiate IV steroids in hopes of controlling her symptoms.  She also needs to be ruled out for an infectious etiology.  Prior evaluations in the past were negative for any infectious source for her flares.  She has not been on a 5-ASA for some time as she did not have a good response in the past, but I will resume this medication.  Plan: 1) IV steroids. 2) Check stool pathogen panel PCR and C. Diff PCR. 3) Maintain Lialda 4.8 grams/day. 4) Continue with AZA. Stuart Mirabile D 09/05/2015, 3:21 PM

## 2015-09-06 DIAGNOSIS — K51211 Ulcerative (chronic) proctitis with rectal bleeding: Principal | ICD-10-CM

## 2015-09-06 LAB — GASTROINTESTINAL PANEL BY PCR, STOOL (REPLACES STOOL CULTURE)
ASTROVIRUS: NOT DETECTED
Adenovirus F40/41: NOT DETECTED
CYCLOSPORA CAYETANENSIS: NOT DETECTED
Campylobacter species: NOT DETECTED
Cryptosporidium: NOT DETECTED
E. COLI O157: NOT DETECTED
ENTAMOEBA HISTOLYTICA: NOT DETECTED
ENTEROTOXIGENIC E COLI (ETEC): NOT DETECTED
Enteroaggregative E coli (EAEC): NOT DETECTED
Enteropathogenic E coli (EPEC): NOT DETECTED
Giardia lamblia: NOT DETECTED
NOROVIRUS GI/GII: NOT DETECTED
Plesimonas shigelloides: NOT DETECTED
ROTAVIRUS A: NOT DETECTED
SALMONELLA SPECIES: NOT DETECTED
SAPOVIRUS (I, II, IV, AND V): NOT DETECTED
SHIGA LIKE TOXIN PRODUCING E COLI (STEC): NOT DETECTED
SHIGELLA/ENTEROINVASIVE E COLI (EIEC): NOT DETECTED
VIBRIO CHOLERAE: NOT DETECTED
VIBRIO SPECIES: NOT DETECTED
Yersinia enterocolitica: NOT DETECTED

## 2015-09-06 LAB — C DIFFICILE QUICK SCREEN W PCR REFLEX
C Diff antigen: NEGATIVE
C Diff interpretation: NEGATIVE
C Diff toxin: NEGATIVE

## 2015-09-06 NOTE — Progress Notes (Signed)
          Daily Rounding Note  09/06/2015, 11:35 AM  LOS: 1 day    For Dr Benson Norway  SUBJECTIVE:       No complaints, no abd pain just some prestooling cramps.  Stool 5 x so far today are still bloody.  Tolerating regular diet as she was at home.  Feels well  OBJECTIVE:         Vital signs in last 24 hours:    Temp:  [97.9 F (36.6 C)-98.4 F (36.9 C)] 98.4 F (36.9 C) (01/07 0651) Pulse Rate:  [79-82] 79 (01/07 0651) Resp:  [18-20] 19 (01/07 0651) BP: (115-135)/(86-97) 121/87 mmHg (01/07 0651) SpO2:  [99 %-100 %] 100 % (01/07 0651) Last BM Date: 09/04/14 There were no vitals filed for this visit. General: pleasant, looks well.    Heart: RRR Chest: clear bil.   Abdomen: soft, active BS, NT, ND  Extremities: no CCE Neuro/Psych:  Pleasant, alert, in good spirits, calm, walking about room without difficulty.  Intake/Output from previous day: 01/06 0701 - 01/07 0700 In: 360 [P.O.:360] Out: 701 [Urine:700; Stool:1]  Intake/Output this shift:    Lab Results:  Recent Labs  09/05/15 1655  WBC 11.9*  HGB 12.4  HCT 37.5  PLT 419*   BMET  Recent Labs  09/05/15 1655  NA 140  K 4.2  CL 105  CO2 23  GLUCOSE 95  BUN 9  CREATININE 0.68  CALCIUM 9.2   LFT  Recent Labs  09/05/15 1655  PROT 7.7  ALBUMIN 4.3  AST 21  ALT 12*  ALKPHOS 50  BILITOT 0.6   PT/INR No results for input(s): LABPROT, INR in the last 72 hours. Hepatitis Panel No results for input(s): HEPBSAG, HCVAB, HEPAIGM, HEPBIGM in the last 72 hours.  Studies/Results: No results found.   Scheduled Meds: . azaTHIOprine  125 mg Oral Daily  . mesalamine  4.8 g Oral Q breakfast  . methylPREDNISolone (SOLU-MEDROL) injection  40 mg Intravenous Q12H   Continuous Infusions:  PRN Meds:..   ASSESMENT:   *  Ulcerative proctitis flaren with resumption of bloody diarrhea 2 days after Remicade infusion on 12/30.  On Uceris, Remicade, Imuran at home.  Now   IV steroids added.  C diff negative.  Stool panel pending. Pt feels well.     PLAN   *  Continue IV steroids, diet and oral UC meds.  Watch for resolution on bloody stools.   * as C diff negative, will stop contact precaution.   Azucena Freed  09/06/2015, 11:35 AM Pager: [phone]  GI ATTENDING  Interval history data reviewed. Case discussed with Dr. Benson Norway. Agree with progress note as outlined bypass practitioner. No evidence for Clostridium difficile. Now on multiple therapies for ulcerative proctitis. Recently started IV steroids per Dr. Benson Norway. Could consider topical therapy but will continue current therapies without change through weekend.  Docia Chuck. Geri Seminole., M.D. [facility] Division of Gastroenterology

## 2015-09-07 NOTE — Progress Notes (Signed)
          Daily Rounding Note  09/07/2015, 10:00 AM  LOS: 2 days    For Dr Benson Norway  SUBJECTIVE:       Latest stools mostly mucoid, one of 4 with visible blood and amount of blood less prominent.  Feels well otherwise  OBJECTIVE:         Vital signs in last 24 hours:    Temp:  [97.6 F (36.4 C)-98.6 F (37 C)] 98.6 F (37 C) (01/08 0459) Pulse Rate:  [68-81] 81 (01/08 0459) Resp:  [15-16] 16 (01/08 0459) BP: (116-123)/(79-81) 117/79 mmHg (01/08 0459) SpO2:  [99 %-100 %] 100 % (01/08 0459) Last BM Date: 09/06/15 (still have bloody stools per pt) There were no vitals filed for this visit. General: pleasant, looks well   Heart: RRR Chest: clear bil.   Abdomen: soft, NT, active BS.  ND  Extremities: no CCE. Neuro/Psych:  Pleasant, calm.  No deficits.   Intake/Output from previous day: 01/07 0701 - 01/08 0700 In: 1240 [P.O.:1240] Out: 750 [Urine:750]  Intake/Output this shift: Total I/O In: 300 [P.O.:300] Out: 1000 [Urine:1000]  Lab Results:  Recent Labs  09/05/15 1655  WBC 11.9*  HGB 12.4  HCT 37.5  PLT 419*   BMET  Recent Labs  09/05/15 1655  NA 140  K 4.2  CL 105  CO2 23  GLUCOSE 95  BUN 9  CREATININE 0.68  CALCIUM 9.2   LFT  Recent Labs  09/05/15 1655  PROT 7.7  ALBUMIN 4.3  AST 21  ALT 12*  ALKPHOS 50  BILITOT 0.6    Studies/Results: No results found.   Scheduled Meds: . azaTHIOprine  125 mg Oral Daily  . mesalamine  4.8 g Oral Q breakfast  . methylPREDNISolone (SOLU-MEDROL) injection  40 mg Intravenous Q12H   Continuous Infusions:  PRN Meds:.   ASSESMENT:   * Ulcerative proctitis flaren with resumption of bloody diarrhea 2 days after Remicade infusion on 12/30. On Uceris, Remicade, Imuran at home. Now IV steroids added, day 3.  Bleeding improved. .  C diff negative. Stool panel pending. Pt feels well.     PLAN   *  Could consider topical therapies, mesalamine  enemas/suppositories and/or steroid enemas (though patient did not feel there were helpful).   Defer decision to Dr Benson Norway. However pt says previous therapy with Canasa suppositories was of no discernable benefit.     Azucena Freed  09/07/2015, 10:00 AM Pager: [phone]  GI ATTENDING  Interval history data reviewed. Patient personally seen and examined. Agree with interval progress note. Actually doing well looks well. Bowel movements as described above. Will continue on current medical regimen per Dr. Benson Norway. Dr. Adriana Mccallum to resume care tomorrow  Docia Chuck. Geri Seminole., M.D. [facility] Division of Gastroenterology

## 2015-09-07 NOTE — Progress Notes (Signed)
Called on call Md regarding BP meds, MD on call stated BP wnl so we will let attending evaluate med in am. Patient made aware.

## 2015-09-08 ENCOUNTER — Encounter (HOSPITAL_COMMUNITY): Payer: Self-pay | Admitting: General Practice

## 2015-09-08 NOTE — Progress Notes (Signed)
Subjective: Feeling better. 6 BM, but stool frequency decreasing.  Only one bout of hematochezia.  Objective: Vital signs in last 24 hours: Temp:  [97.9 F (36.6 C)-98.5 F (36.9 C)] 97.9 F (36.6 C) (01/09 0519) Pulse Rate:  [70-81] 81 (01/09 0519) Resp:  [15-16] 15 (01/09 0519) BP: (121-132)/(81-89) 132/85 mmHg (01/09 0519) SpO2:  [100 %] 100 % (01/09 0519) Last BM Date: 09/06/15 (some bloody stools this AM)  Intake/Output from previous day: 01/08 0701 - 01/09 0700 In: 1430 [P.O.:1430] Out: 2800 [Urine:2800] Intake/Output this shift:    General appearance: alert and no distress  Lab Results:  Recent Labs  09/05/15 1655  WBC 11.9*  HGB 12.4  HCT 37.5  PLT 419*   BMET  Recent Labs  09/05/15 1655  NA 140  K 4.2  CL 105  CO2 23  GLUCOSE 95  BUN 9  CREATININE 0.68  CALCIUM 9.2   LFT  Recent Labs  09/05/15 1655  PROT 7.7  ALBUMIN 4.3  AST 21  ALT 12*  ALKPHOS 50  BILITOT 0.6   PT/INR No results for input(s): LABPROT, INR in the last 72 hours. Hepatitis Panel No results for input(s): HEPBSAG, HCVAB, HEPAIGM, HEPBIGM in the last 72 hours. C-Diff  Recent Labs  09/06/15 0657  CDIFFTOX NEGATIVE   Fecal Lactopherrin No results for input(s): FECLLACTOFRN in the last 72 hours.  Studies/Results: No results found.  Medications:  Scheduled: . azaTHIOprine  125 mg Oral Daily  . mesalamine  4.8 g Oral Q breakfast  . methylPREDNISolone (SOLU-MEDROL) injection  40 mg Intravenous Q12H   Continuous:   Assessment/Plan: 1) Ulcerative proctitis flare.   She is feeling better and she notices an improvement.  Overall she is tolerating the solumedrol well.  Her stool frequency has declined mildly, but the volume per bowel movement has declined.  She only had one bout of minor hematochezia yesterday.  Plan: 1) Continue with Solumedrol. 2) ? D/C home tomorrow.   LOS: 3 days   Nautica Hotz D 09/08/2015, 8:23 AM

## 2015-09-08 NOTE — Care Management Note (Signed)
Case Management Note  Patient Details  Name: Gabrielle Wilkins MRN: [id number] Date of Birth: 12/24/1975  Subjective/Objective:                    Action/Plan:  UR completed Expected Discharge Date:                  Expected Discharge Plan:  Home/Self Care  In-House Referral:     Discharge planning Services     Post Acute Care Choice:    Choice offered to:     DME Arranged:    DME Agency:     HH Arranged:    [place] Agency:     Status of Service:  In process, will continue to follow  Medicare Important Message Given:    Date Medicare IM Given:    Medicare IM give by:    Date Additional Medicare IM Given:    Additional Medicare Important Message give by:     If discussed at [place] of Stay Meetings, dates discussed:    Additional Comments:  Marilu Favre, RN 09/08/2015, 2:36 PM

## 2015-09-09 LAB — BASIC METABOLIC PANEL
ANION GAP: 8 (ref 5–15)
BUN: 12 mg/dL (ref 6–20)
CO2: 26 mmol/L (ref 22–32)
Calcium: 8.7 mg/dL — ABNORMAL LOW (ref 8.9–10.3)
Chloride: 108 mmol/L (ref 101–111)
Creatinine, Ser: 0.64 mg/dL (ref 0.44–1.00)
GFR calc non Af Amer: >60 mL/min (ref >60)
GLUCOSE: 86 mg/dL (ref 65–99)
POTASSIUM: 3.9 mmol/L (ref 3.5–5.1)
Sodium: 142 mmol/L (ref 135–145)

## 2015-09-09 LAB — CBC
HEMATOCRIT: 31.8 % — AB (ref 36.0–46.0)
Hemoglobin: 10.2 g/dL — ABNORMAL LOW (ref 12.0–15.0)
MCH: 28.3 pg (ref 26.0–34.0)
MCHC: 32.1 g/dL (ref 30.0–36.0)
MCV: 88.3 fL (ref 78.0–100.0)
PLATELETS: 348 10*3/uL (ref 150–400)
RBC: 3.6 MIL/uL — AB (ref 3.87–5.11)
RDW: 16.1 % — ABNORMAL HIGH (ref 11.5–15.5)
WBC: 13.5 10*3/uL — AB (ref 4.0–10.5)

## 2015-09-09 MED ORDER — METHYLPREDNISOLONE SODIUM SUCC 40 MG IJ SOLR
40.0000 mg | Freq: Once | INTRAMUSCULAR | Status: AC
  Administered 2015-09-09: 40 mg via INTRAVENOUS
  Filled 2015-09-09: qty 1

## 2015-09-09 NOTE — Discharge Summary (Signed)
Physician Discharge Summary  Patient ID: Gabrielle Wilkins MRN: [id number] DOB/AGE: 1976-07-21 40 y.o.  Admit date: 09/05/2015 Discharge date: 09/09/2015  Admission Diagnoses: Ulcerative proctitis flare with hematochezia.  Discharge Diagnoses:  Active Problems:   Ulcerative (chronic) proctitis with rectal bleeding [facility])   Discharged Condition: good  Hospital Course: The patient was admitted for persistent bloody, mucus, diarrhea for the past two months.  She had exhausted all the outpatient treatments with her schedule infusion of Remicade, reinstitution of AZA, and a steroid taper.  The patient was admitted as she qualified for a severe flare, i.e., > 8 BM per day.  Solumedrol 40 mg IV was started q12 hours and on the third day of her hospitalization she noticed an improvement.  On the day of discharge and the day prior she reported 5 forming bowel movements.  No further hematochezia or mucus.  In addition to the Solumedrol she was maintained on AZA and Lialda 4.8 grams was restarted.  Her blood work was essentially normal and surprisingly, her CRP level was normal.  Infectious etiologies were ruled out with stool studies.  Consults: None  Significant Diagnostic Studies: labs: basic blood work.  Treatments: see Hospital course section.  Discharge Exam: Blood pressure 131/74, pulse 63, temperature 98.6 F (37 C), temperature source Oral, resp. rate 18, height 5' 2"  (1.575 m), weight 57.153 kg (126 lb), last menstrual period 08/13/2015, SpO2 100 %. General appearance: alert and no distress Resp: clear to auscultation bilaterally Cardio: regular rate and rhythm GI: soft, non-tender; bowel sounds normal; no masses,  no organomegaly Extremities: extremities normal, atraumatic, no cyanosis or edema  Disposition: 01-Home or Self Care     Medication List    STOP taking these medications        albuterol 108 (90 Base) MCG/ACT inhaler  Commonly known as:  PROVENTIL HFA;VENTOLIN HFA     ipratropium 0.06 % nasal spray  Commonly known as:  ATROVENT     traMADol 50 MG tablet  Commonly known as:  ULTRAM     UCERIS 9 MG Tb24  Generic drug:  Budesonide      TAKE these medications        azaTHIOprine 50 MG tablet  Commonly known as:  IMURAN  Take 125 mg by mouth daily.     lisinopril-hydrochlorothiazide 10-12.5 MG tablet  Commonly known as:  PRINZIDE,ZESTORETIC  Take 1 tablet by mouth daily.     REMICADE IV  Inject into the vein every 6 (six) weeks. 5 mg/kg/ last infusion August 29, 2015         Signed: Beryle Beams 09/09/2015, 1:28 PM

## 2015-09-09 NOTE — Progress Notes (Signed)
Discharge home. Home discharge instruction given, no question verbalized. 

## 2015-10-09 ENCOUNTER — Other Ambulatory Visit (HOSPITAL_COMMUNITY): Payer: Self-pay | Admitting: *Deleted

## 2015-10-10 ENCOUNTER — Ambulatory Visit (HOSPITAL_COMMUNITY): Admission: RE | Admit: 2015-10-10 | Payer: BC Managed Care – PPO | Source: Ambulatory Visit

## 2015-10-15 ENCOUNTER — Ambulatory Visit (HOSPITAL_COMMUNITY)
Admission: RE | Admit: 2015-10-15 | Discharge: 2015-10-15 | Disposition: A | Discharge status: Home / Self Care | Payer: BC Managed Care – PPO | Source: Ambulatory Visit | Attending: Gastroenterology | Admitting: Gastroenterology

## 2015-10-15 DIAGNOSIS — K5120 Ulcerative (chronic) proctitis without complications: Secondary | ICD-10-CM | POA: Insufficient documentation

## 2015-10-15 MED ORDER — SODIUM CHLORIDE 0.9 % IV SOLN
INTRAVENOUS | Status: DC
  Administered 2015-10-15: 250 mL via INTRAVENOUS

## 2015-10-15 MED ORDER — SODIUM CHLORIDE 0.9 % IV SOLN
5.0000 mg/kg | INTRAVENOUS | Status: DC
  Administered 2015-10-15: 300 mg via INTRAVENOUS
  Filled 2015-10-15: qty 30

## 2015-11-28 ENCOUNTER — Ambulatory Visit (HOSPITAL_COMMUNITY)
Admission: RE | Admit: 2015-11-28 | Discharge: 2015-11-28 | Disposition: A | Discharge status: Home / Self Care | Payer: BC Managed Care – PPO | Source: Ambulatory Visit | Attending: Gastroenterology | Admitting: Gastroenterology

## 2015-11-28 DIAGNOSIS — K5120 Ulcerative (chronic) proctitis without complications: Secondary | ICD-10-CM | POA: Diagnosis not present

## 2015-11-28 MED ORDER — SODIUM CHLORIDE 0.9 % IV SOLN
10.0000 mg/kg | INTRAVENOUS | Status: DC
  Administered 2015-11-28: 600 mg via INTRAVENOUS
  Filled 2015-11-28: qty 60

## 2015-11-28 MED ORDER — SODIUM CHLORIDE 0.9 % IV SOLN
INTRAVENOUS | Status: DC
  Administered 2015-11-28: 09:00:00 via INTRAVENOUS

## 2015-11-28 MED ORDER — SODIUM CHLORIDE 0.9 % IV SOLN: 5.0000 mg/kg | INTRAVENOUS | Status: DC

## 2015-12-08 ENCOUNTER — Other Ambulatory Visit: Payer: Self-pay | Admitting: Family Medicine

## 2015-12-08 DIAGNOSIS — Z1231 Encounter for screening mammogram for malignant neoplasm of breast: Secondary | ICD-10-CM

## 2015-12-24 ENCOUNTER — Ambulatory Visit
Admission: RE | Admit: 2015-12-24 | Discharge: 2015-12-24 | Disposition: A | Discharge status: Home / Self Care | Payer: BC Managed Care – PPO | Source: Ambulatory Visit | Attending: Family Medicine | Admitting: Family Medicine

## 2015-12-24 DIAGNOSIS — Z1231 Encounter for screening mammogram for malignant neoplasm of breast: Secondary | ICD-10-CM

## 2016-01-09 ENCOUNTER — Ambulatory Visit (HOSPITAL_COMMUNITY)
Admission: RE | Admit: 2016-01-09 | Discharge: 2016-01-09 | Disposition: A | Discharge status: Home / Self Care | Payer: BC Managed Care – PPO | Source: Ambulatory Visit | Attending: Gastroenterology | Admitting: Gastroenterology

## 2016-01-09 DIAGNOSIS — K5120 Ulcerative (chronic) proctitis without complications: Secondary | ICD-10-CM | POA: Insufficient documentation

## 2016-01-09 MED ORDER — SODIUM CHLORIDE 0.9 % IV SOLN
INTRAVENOUS | Status: DC
  Administered 2016-01-09: 09:00:00 via INTRAVENOUS

## 2016-01-09 MED ORDER — SODIUM CHLORIDE 0.9 % IV SOLN
10.0000 mg/kg | INTRAVENOUS | Status: DC
  Administered 2016-01-09: 600 mg via INTRAVENOUS
  Filled 2016-01-09 (×2): qty 60

## 2016-02-19 ENCOUNTER — Other Ambulatory Visit (HOSPITAL_COMMUNITY): Payer: Self-pay | Admitting: *Deleted

## 2016-02-20 ENCOUNTER — Ambulatory Visit (HOSPITAL_COMMUNITY)
Admission: RE | Admit: 2016-02-20 | Discharge: 2016-02-20 | Disposition: A | Discharge status: Home / Self Care | Payer: BC Managed Care – PPO | Source: Ambulatory Visit | Attending: Gastroenterology | Admitting: Gastroenterology

## 2016-02-20 DIAGNOSIS — K5120 Ulcerative (chronic) proctitis without complications: Secondary | ICD-10-CM | POA: Insufficient documentation

## 2016-02-20 MED ORDER — SODIUM CHLORIDE 0.9 % IV SOLN
10.0000 mg/kg | INTRAVENOUS | Status: DC
  Administered 2016-02-20: 600 mg via INTRAVENOUS
  Filled 2016-02-20: qty 60

## 2016-02-20 MED ORDER — SODIUM CHLORIDE 0.9 % IV SOLN
INTRAVENOUS | Status: DC
  Administered 2016-02-20: 13:00:00 via INTRAVENOUS

## 2016-04-02 ENCOUNTER — Ambulatory Visit (HOSPITAL_COMMUNITY)
Admission: RE | Admit: 2016-04-02 | Discharge: 2016-04-02 | Disposition: A | Discharge status: Home / Self Care | Payer: BC Managed Care – PPO | Source: Ambulatory Visit | Attending: Gastroenterology | Admitting: Gastroenterology

## 2016-04-02 DIAGNOSIS — K5120 Ulcerative (chronic) proctitis without complications: Secondary | ICD-10-CM | POA: Insufficient documentation

## 2016-04-02 MED ORDER — SODIUM CHLORIDE 0.9 % IV SOLN
INTRAVENOUS | Status: DC
  Administered 2016-04-02: 09:00:00 via INTRAVENOUS

## 2016-04-02 MED ORDER — INFLIXIMAB 100 MG IV SOLR
5.0000 mg/kg | INTRAVENOUS | Status: DC
Start: 2016-04-02 — End: 2016-04-03
  Administered 2016-04-02: 300 mg via INTRAVENOUS
  Filled 2016-04-02: qty 30

## 2016-05-14 ENCOUNTER — Encounter (HOSPITAL_COMMUNITY): Payer: BC Managed Care – PPO

## 2016-05-17 ENCOUNTER — Other Ambulatory Visit (HOSPITAL_COMMUNITY): Payer: Self-pay | Admitting: *Deleted

## 2016-05-18 ENCOUNTER — Ambulatory Visit (HOSPITAL_COMMUNITY)
Admission: RE | Admit: 2016-05-18 | Discharge: 2016-05-18 | Disposition: A | Discharge status: Home / Self Care | Payer: BC Managed Care – PPO | Source: Ambulatory Visit | Attending: Gastroenterology | Admitting: Gastroenterology

## 2016-05-18 DIAGNOSIS — K5120 Ulcerative (chronic) proctitis without complications: Secondary | ICD-10-CM | POA: Insufficient documentation

## 2016-05-18 MED ORDER — SODIUM CHLORIDE 0.9 % IV SOLN
5.0000 mg/kg | INTRAVENOUS | Status: DC
  Administered 2016-05-18: 300 mg via INTRAVENOUS
  Filled 2016-05-18: qty 30

## 2016-05-18 MED ORDER — SODIUM CHLORIDE 0.9 % IV SOLN
INTRAVENOUS | Status: DC
  Administered 2016-05-18: 13:00:00 via INTRAVENOUS

## 2016-06-29 ENCOUNTER — Ambulatory Visit (HOSPITAL_COMMUNITY)
Admission: RE | Admit: 2016-06-29 | Discharge: 2016-06-29 | Disposition: A | Discharge status: Home / Self Care | Payer: BC Managed Care – PPO | Source: Ambulatory Visit | Attending: Gastroenterology | Admitting: Gastroenterology

## 2016-06-29 DIAGNOSIS — K5120 Ulcerative (chronic) proctitis without complications: Secondary | ICD-10-CM | POA: Diagnosis present

## 2016-06-29 MED ORDER — SODIUM CHLORIDE 0.9 % IV SOLN
INTRAVENOUS | Status: DC
  Administered 2016-06-29: 13:00:00 via INTRAVENOUS

## 2016-06-29 MED ORDER — SODIUM CHLORIDE 0.9 % IV SOLN
5.0000 mg/kg | INTRAVENOUS | Status: DC
  Administered 2016-06-29: 300 mg via INTRAVENOUS
  Filled 2016-06-29: qty 30

## 2016-08-10 ENCOUNTER — Encounter (HOSPITAL_COMMUNITY): Payer: BC Managed Care – PPO

## 2016-08-16 ENCOUNTER — Ambulatory Visit (HOSPITAL_COMMUNITY)
Admission: RE | Admit: 2016-08-16 | Discharge: 2016-08-16 | Disposition: A | Discharge status: Home / Self Care | Payer: BC Managed Care – PPO | Source: Ambulatory Visit | Attending: Gastroenterology | Admitting: Gastroenterology

## 2016-08-16 DIAGNOSIS — K5120 Ulcerative (chronic) proctitis without complications: Secondary | ICD-10-CM | POA: Diagnosis present

## 2016-08-16 MED ORDER — SODIUM CHLORIDE 0.9 % IV SOLN
INTRAVENOUS | Status: AC
  Administered 2016-08-16: 12:00:00 via INTRAVENOUS

## 2016-08-16 MED ORDER — SODIUM CHLORIDE 0.9 % IV SOLN
5.0000 mg/kg | INTRAVENOUS | Status: AC
  Administered 2016-08-16: 300 mg via INTRAVENOUS
  Filled 2016-08-16: qty 30

## 2016-10-01 ENCOUNTER — Encounter (HOSPITAL_COMMUNITY): Payer: BC Managed Care – PPO

## 2016-11-07 ENCOUNTER — Inpatient Hospital Stay (HOSPITAL_COMMUNITY)
Admission: EM | Admit: 2016-11-07 | Discharge: 2016-11-10 | DRG: 204 | Disposition: A | Discharge status: Home / Self Care | Payer: BC Managed Care – PPO | Attending: Internal Medicine | Admitting: Internal Medicine

## 2016-11-07 ENCOUNTER — Emergency Department (HOSPITAL_COMMUNITY): Payer: BC Managed Care – PPO

## 2016-11-07 ENCOUNTER — Encounter (HOSPITAL_COMMUNITY): Payer: Self-pay | Admitting: Emergency Medicine

## 2016-11-07 DIAGNOSIS — D899 Disorder involving the immune mechanism, unspecified: Secondary | ICD-10-CM | POA: Diagnosis present

## 2016-11-07 DIAGNOSIS — D849 Immunodeficiency, unspecified: Secondary | ICD-10-CM

## 2016-11-07 DIAGNOSIS — K5120 Ulcerative (chronic) proctitis without complications: Secondary | ICD-10-CM | POA: Diagnosis present

## 2016-11-07 DIAGNOSIS — R7989 Other specified abnormal findings of blood chemistry: Secondary | ICD-10-CM | POA: Diagnosis not present

## 2016-11-07 DIAGNOSIS — R042 Hemoptysis: Secondary | ICD-10-CM | POA: Diagnosis not present

## 2016-11-07 DIAGNOSIS — K51219 Ulcerative (chronic) proctitis with unspecified complications: Secondary | ICD-10-CM

## 2016-11-07 DIAGNOSIS — I10 Essential (primary) hypertension: Secondary | ICD-10-CM | POA: Diagnosis present

## 2016-11-07 DIAGNOSIS — T502X5A Adverse effect of carbonic-anhydrase inhibitors, benzothiadiazides and other diuretics, initial encounter: Secondary | ICD-10-CM | POA: Diagnosis present

## 2016-11-07 DIAGNOSIS — E876 Hypokalemia: Secondary | ICD-10-CM

## 2016-11-07 DIAGNOSIS — R0781 Pleurodynia: Secondary | ICD-10-CM | POA: Diagnosis not present

## 2016-11-07 DIAGNOSIS — K219 Gastro-esophageal reflux disease without esophagitis: Secondary | ICD-10-CM | POA: Diagnosis present

## 2016-11-07 DIAGNOSIS — R918 Other nonspecific abnormal finding of lung field: Secondary | ICD-10-CM | POA: Diagnosis not present

## 2016-11-07 DIAGNOSIS — Z79899 Other long term (current) drug therapy: Secondary | ICD-10-CM

## 2016-11-07 DIAGNOSIS — Z9049 Acquired absence of other specified parts of digestive tract: Secondary | ICD-10-CM

## 2016-11-07 DIAGNOSIS — R946 Abnormal results of thyroid function studies: Secondary | ICD-10-CM | POA: Diagnosis present

## 2016-11-07 DIAGNOSIS — D649 Anemia, unspecified: Secondary | ICD-10-CM

## 2016-11-07 DIAGNOSIS — R0902 Hypoxemia: Secondary | ICD-10-CM | POA: Diagnosis present

## 2016-11-07 LAB — HEPATIC FUNCTION PANEL
ALK PHOS: 122 U/L (ref 38–126)
ALT: 50 U/L (ref 14–54)
AST: 47 U/L — ABNORMAL HIGH (ref 15–41)
Albumin: 3.2 g/dL — ABNORMAL LOW (ref 3.5–5.0)
BILIRUBIN TOTAL: 0.3 mg/dL (ref 0.3–1.2)
Total Protein: 6.9 g/dL (ref 6.5–8.1)

## 2016-11-07 LAB — CBC
HEMATOCRIT: 30.6 % — AB (ref 36.0–46.0)
HEMATOCRIT: 30 % — AB (ref 36.0–46.0)
HEMOGLOBIN: 10.1 g/dL — AB (ref 12.0–15.0)
HEMOGLOBIN: 9.8 g/dL — AB (ref 12.0–15.0)
MCH: 27.8 pg (ref 26.0–34.0)
MCH: 27.7 pg (ref 26.0–34.0)
MCHC: 33 g/dL (ref 30.0–36.0)
MCHC: 32.7 g/dL (ref 30.0–36.0)
MCV: 84.3 fL (ref 78.0–100.0)
MCV: 84.7 fL (ref 78.0–100.0)
Platelets: 424 10*3/uL — ABNORMAL HIGH (ref 150–400)
Platelets: 410 10*3/uL — ABNORMAL HIGH (ref 150–400)
RBC: 3.63 MIL/uL — ABNORMAL LOW (ref 3.87–5.11)
RBC: 3.54 MIL/uL — ABNORMAL LOW (ref 3.87–5.11)
RDW: 13.4 % (ref 11.5–15.5)
RDW: 13.6 % (ref 11.5–15.5)
WBC: 11.2 10*3/uL — ABNORMAL HIGH (ref 4.0–10.5)
WBC: 10.8 10*3/uL — AB (ref 4.0–10.5)

## 2016-11-07 LAB — TSH: TSH: 8.571 u[IU]/mL — ABNORMAL HIGH (ref 0.350–4.500)

## 2016-11-07 LAB — BASIC METABOLIC PANEL
ANION GAP: 12 (ref 5–15)
BUN: 9 mg/dL (ref 6–20)
CO2: 24 mmol/L (ref 22–32)
Calcium: 8.8 mg/dL — ABNORMAL LOW (ref 8.9–10.3)
Chloride: 100 mmol/L — ABNORMAL LOW (ref 101–111)
Creatinine, Ser: 0.8 mg/dL (ref 0.44–1.00)
GFR calc Af Amer: >60 mL/min (ref >60)
GLUCOSE: 94 mg/dL (ref 65–99)
POTASSIUM: 3.4 mmol/L — AB (ref 3.5–5.1)
Sodium: 136 mmol/L (ref 135–145)

## 2016-11-07 LAB — IRON AND TIBC
Iron: 14 ug/dL — ABNORMAL LOW (ref 28–170)
Saturation Ratios: 4 % — ABNORMAL LOW (ref 10.4–31.8)
TIBC: 357 ug/dL (ref 250–450)
UIBC: 343 ug/dL

## 2016-11-07 LAB — FERRITIN: Ferritin: 9 ng/mL — ABNORMAL LOW (ref 11–307)

## 2016-11-07 LAB — URINALYSIS, ROUTINE W REFLEX MICROSCOPIC
BACTERIA UA: NONE SEEN
BILIRUBIN URINE: NEGATIVE
GLUCOSE, UA: NEGATIVE mg/dL
Ketones, ur: 5 mg/dL — AB
NITRITE: NEGATIVE
PH: 5 (ref 5.0–8.0)
Protein, ur: NEGATIVE mg/dL
SPECIFIC GRAVITY, URINE: 1.023 (ref 1.005–1.030)

## 2016-11-07 LAB — CREATININE, SERUM: Creatinine, Ser: 0.62 mg/dL (ref 0.44–1.00)

## 2016-11-07 LAB — SEDIMENTATION RATE: SED RATE: 47 mm/h — AB (ref 0–22)

## 2016-11-07 LAB — LIPASE, BLOOD: LIPASE: 16 U/L (ref 11–51)

## 2016-11-07 LAB — I-STAT TROPONIN, ED: Troponin i, poc: 0 ng/mL (ref 0.00–0.08)

## 2016-11-07 LAB — PREGNANCY, URINE: Preg Test, Ur: NEGATIVE

## 2016-11-07 LAB — SURGICAL PCR SCREEN
MRSA, PCR: NEGATIVE
STAPHYLOCOCCUS AUREUS: NEGATIVE

## 2016-11-07 LAB — D-DIMER, QUANTITATIVE: D-Dimer, Quant: 1.02 ug/mL-FEU — ABNORMAL HIGH (ref 0.00–0.50)

## 2016-11-07 LAB — STREP PNEUMONIAE URINARY ANTIGEN: Strep Pneumo Urinary Antigen: NEGATIVE

## 2016-11-07 LAB — C-REACTIVE PROTEIN: CRP: 2 mg/dL — AB (ref <1.0)

## 2016-11-07 LAB — T4, FREE: FREE T4: 1.21 ng/dL — AB (ref 0.61–1.12)

## 2016-11-07 MED ORDER — LISINOPRIL 10 MG PO TABS
10.0000 mg | ORAL_TABLET | Freq: Every day | ORAL | Status: DC
  Administered 2016-11-07 – 2016-11-10 (×4): 10 mg via ORAL
  Filled 2016-11-07 (×4): qty 1

## 2016-11-07 MED ORDER — MORPHINE SULFATE (PF) 4 MG/ML IV SOLN: 2.0000 mg | INTRAVENOUS | Status: DC | PRN

## 2016-11-07 MED ORDER — HYDROCHLOROTHIAZIDE 12.5 MG PO CAPS
12.5000 mg | ORAL_CAPSULE | Freq: Every day | ORAL | Status: DC
  Administered 2016-11-07 – 2016-11-10 (×4): 12.5 mg via ORAL
  Filled 2016-11-07 (×4): qty 1

## 2016-11-07 MED ORDER — POTASSIUM CHLORIDE CRYS ER 20 MEQ PO TBCR
20.0000 meq | EXTENDED_RELEASE_TABLET | Freq: Once | ORAL | Status: AC
  Administered 2016-11-07: 20 meq via ORAL
  Filled 2016-11-07: qty 1

## 2016-11-07 MED ORDER — ONDANSETRON HCL 4 MG PO TABS
4.0000 mg | ORAL_TABLET | Freq: Four times a day (QID) | ORAL | Status: DC | PRN
  Administered 2016-11-09 (×2): 4 mg via ORAL
  Filled 2016-11-07 (×2): qty 1

## 2016-11-07 MED ORDER — SODIUM CHLORIDE 0.9% FLUSH
3.0000 mL | Freq: Two times a day (BID) | INTRAVENOUS | Status: DC
  Administered 2016-11-07 – 2016-11-10 (×6): 3 mL via INTRAVENOUS

## 2016-11-07 MED ORDER — ONDANSETRON HCL 4 MG/2ML IJ SOLN
4.0000 mg | Freq: Once | INTRAMUSCULAR | Status: AC
  Administered 2016-11-07: 4 mg via INTRAVENOUS
  Filled 2016-11-07: qty 2

## 2016-11-07 MED ORDER — TUBERCULIN PPD 5 UNIT/0.1ML ID SOLN
5.0000 [IU] | Freq: Once | INTRADERMAL | Status: AC
  Administered 2016-11-08: 5 [IU] via INTRADERMAL
  Filled 2016-11-07 (×2): qty 0.1

## 2016-11-07 MED ORDER — ONDANSETRON HCL 4 MG/2ML IJ SOLN: 4.0000 mg | Freq: Four times a day (QID) | INTRAMUSCULAR | Status: DC | PRN

## 2016-11-07 MED ORDER — HEPARIN SODIUM (PORCINE) 5000 UNIT/ML IJ SOLN
5000.0000 [IU] | Freq: Three times a day (TID) | INTRAMUSCULAR | Status: DC
  Administered 2016-11-07 – 2016-11-10 (×7): 5000 [IU] via SUBCUTANEOUS
  Filled 2016-11-07 (×7): qty 1

## 2016-11-07 MED ORDER — ACETAMINOPHEN 650 MG RE SUPP: 650.0000 mg | Freq: Four times a day (QID) | RECTAL | Status: DC | PRN

## 2016-11-07 MED ORDER — ACETAMINOPHEN 325 MG PO TABS: 650.0000 mg | ORAL_TABLET | Freq: Four times a day (QID) | ORAL | Status: DC | PRN

## 2016-11-07 MED ORDER — MORPHINE SULFATE (PF) 4 MG/ML IV SOLN
4.0000 mg | INTRAVENOUS | Status: DC | PRN
  Administered 2016-11-07: 2 mg via INTRAVENOUS
  Administered 2016-11-08: 4 mg via INTRAVENOUS
  Filled 2016-11-07 (×3): qty 1

## 2016-11-07 MED ORDER — IOPAMIDOL (ISOVUE-370) INJECTION 76%
INTRAVENOUS | Status: AC
  Administered 2016-11-07: 100 mL
  Filled 2016-11-07: qty 100

## 2016-11-07 MED ORDER — HYDROMORPHONE HCL 2 MG/ML IJ SOLN
1.0000 mg | Freq: Once | INTRAMUSCULAR | Status: AC
  Administered 2016-11-07: 1 mg via INTRAVENOUS
  Filled 2016-11-07: qty 1

## 2016-11-07 MED ORDER — SENNOSIDES-DOCUSATE SODIUM 8.6-50 MG PO TABS: 1.0000 | ORAL_TABLET | Freq: Every evening | ORAL | Status: DC | PRN

## 2016-11-07 MED ORDER — PANTOPRAZOLE SODIUM 40 MG PO TBEC
40.0000 mg | DELAYED_RELEASE_TABLET | Freq: Every day | ORAL | Status: DC
  Administered 2016-11-07 – 2016-11-10 (×4): 40 mg via ORAL
  Filled 2016-11-07 (×4): qty 1

## 2016-11-07 MED ORDER — HYDROCODONE-ACETAMINOPHEN 5-325 MG PO TABS
1.0000 | ORAL_TABLET | ORAL | Status: DC | PRN
  Administered 2016-11-07 – 2016-11-10 (×9): 2 via ORAL
  Filled 2016-11-07 (×9): qty 2

## 2016-11-07 MED ORDER — SODIUM CHLORIDE 0.9% FLUSH: 3.0000 mL | INTRAVENOUS | Status: DC | PRN

## 2016-11-07 MED ORDER — SODIUM CHLORIDE 0.9 % IV SOLN: 250.0000 mL | INTRAVENOUS | Status: DC | PRN

## 2016-11-07 MED ORDER — LISINOPRIL-HYDROCHLOROTHIAZIDE 10-12.5 MG PO TABS: 1.0000 | ORAL_TABLET | Freq: Every day | ORAL | Status: DC

## 2016-11-07 MED ORDER — OXYCODONE-ACETAMINOPHEN 5-325 MG PO TABS
1.0000 | ORAL_TABLET | Freq: Once | ORAL | Status: AC
  Administered 2016-11-07: 1 via ORAL
  Filled 2016-11-07: qty 1

## 2016-11-07 NOTE — ED Notes (Signed)
Pt. Transported via stretcher to room on floor. Pt. Transported wearing n-95 mask. Pt. Belongings including clothing and cell phone transported. Pt. And husband made aware of valueables policy.

## 2016-11-07 NOTE — ED Notes (Signed)
Pt reports she is having pain that started on Wednesday in her central chest and is now hurting in her upper right abd. Pt denies N/V/D. Pt describes her pain as a crampy aching pain.

## 2016-11-07 NOTE — ED Notes (Signed)
Admitting at bedside 

## 2016-11-07 NOTE — H&P (Signed)
[facility] History and Physical  DAISIA SLOMSKI UYQ:[id number] DOB: August 14, 1976 DOA: 11/07/2016  Referring physician: ED MD PCP: Gerrit Heck, MD   Chief Complaint: sharp meg pain/pleuritic cp  HPI: Gabrielle Wilkins is a 41 y.o. female with significant past medical history of HTN, ulcerative colitis, has been started on Entyvio for UC for the past 3-4 week, has also been on Remicade in past, now presents to the ED secondary to severe midepigastric/pleuritic chest pain that woke her up last night around 11:00pm.  Pt states about 6 weeks ago she had milder pain in the mid epigastric region and her primary care doctor started her on omeprazole.  She finished that about 2 weeks ago, however the MEG pains restarted this past week.  She called her GI doctor,  who started her on omeprazole again this last week. She denies any nausea or vomiting.  Denies any fevers or chills, cough, proximal nocturnal dyspnea. Denies any diarrhea. Of note, she said she had a TB skin test about a month ago and it was negative prior to starting Entyvio.  No sick contacts, she was in [place] last month.   Mom had pancreatic cancer and dad had breast cancer.  Pt states she has had genetic testing in past and does not have the brca gene.  Review of Systems:  Per HPI, o/w all systems reviewed and negative.  Past Medical History:  Diagnosis Date  . Depression   . Hypertension    Chronic Hypertension  . Migraine, ophthalmoplegic   . Pregnancy induced hypertension   . Ulcerative colitis   . Ulcerative colitis [facility])    Past Surgical History:  Procedure Laterality Date  . CESAREAN SECTION  04/05/2011   Procedure: CESAREAN SECTION;  Surgeon: Logan Bores;  Location: [facility];  Service: Gynecology;  Laterality: N/A;  baby boy 73  APGAR 7/9  . CHOLECYSTECTOMY  2008  . COLONOSCOPY N/A   . DILATION AND CURETTAGE OF UTERUS     Social History:  reports that she has never smoked. She has never used  smokeless tobacco. She reports that she drinks about 1.8 oz of alcohol per week . She reports that she does not use drugs.  Allergies  Allergen Reactions  . Ceclor [Cefaclor] Hives    Patient said she can tolerate penicillin.  Hasn't had Keflex.     Family History  Problem Relation Age of Onset  . Pancreatic cancer Mother   . Dementia Mother   . Hypertension Mother   . Heart attack Father   . Alcoholism Father   . Cirrhosis Father   . Hypertension Brother   . Arthritis/Rheumatoid Brother   . Hypertension Brother   . HIV Brother   . Bipolar disorder Brother   . Hypertension Brother     Prior to Admission medications   Medication Sig Start Date End Date Taking? Authorizing Provider  lisinopril-hydrochlorothiazide (PRINZIDE,ZESTORETIC) 10-12.5 MG per tablet Take 1 tablet by mouth daily.    Yes Historical Provider, MD  omeprazole (PRILOSEC) 40 MG capsule Take 40 mg by mouth daily.   Yes Historical Provider, MD  vedolizumab (ENTYVIO) 300 MG injection Inject 300 mg into the vein every 14 (fourteen) days.   Yes Historical Provider, MD   Physical Exam: Vitals:   11/07/16 0530 11/07/16 0545 11/07/16 0600 11/07/16 0700  BP: 110/81 110/79 118/84 112/92  Pulse: 78 81 88 87  Resp: 17 16 15 13   SpO2: 98% 99% 99% 97%    Wt Readings from Last  3 Encounters:  08/16/16 63.5 kg (140 lb)  06/29/16 65.8 kg (145 lb)  05/18/16 63.5 kg (140 lb)    General:  Appears calm and comfortable, pleasant, NAD, AAOx3 Eyes: PERRL, normal lids, irises & conjunctiva ENT: grossly normal hearing, lips & tongue, mmm Neck: no LAD, masses or thyromegaly Cardiovascular: RRR, no m/r/g. No LE edema. Telemetry: SR, no arrhythmias  Respiratory: CTA bilaterally, no w/r/r. Normal respiratory effort. Abdomen: soft, ntnd Skin: no rash or induration seen on limited exam Musculoskeletal: grossly normal tone BUE/BLE Psychiatric: grossly normal mood and affect, speech fluent and appropriate Neurologic: grossly  non-focal.          Labs on Admission:  Basic Metabolic Panel:  Recent Labs Lab 11/07/16 0107  NA 136  K 3.4*  CL 100*  CO2 24  GLUCOSE 94  BUN 9  CREATININE 0.80  CALCIUM 8.8*   Liver Function Tests:  Recent Labs Lab 11/07/16 0325  AST 47*  ALT 50  ALKPHOS 122  BILITOT 0.3  PROT 6.9  ALBUMIN 3.2*    Recent Labs Lab 11/07/16 0107  LIPASE 16   No results for input(s): AMMONIA in the last 168 hours. CBC:  Recent Labs Lab 11/07/16 0107  WBC 11.2*  HGB 10.1*  HCT 30.6*  MCV 84.3  PLT 424*   Cardiac Enzymes: No results for input(s): CKTOTAL, CKMB, CKMBINDEX, TROPONINI in the last 168 hours.  BNP (last 3 results) No results for input(s): BNP in the last 8760 hours.  ProBNP (last 3 results) No results for input(s): PROBNP in the last 8760 hours.  CBG: No results for input(s): GLUCAP in the last 168 hours.  Radiological Exams on Admission: Dg Chest 2 View  Result Date: 11/07/2016 CLINICAL DATA:  Epigastric and right-sided abdominal pain for several days. EXAM: CHEST  2 VIEW COMPARISON:  None. FINDINGS: The heart size and mediastinal contours are within normal limits. Both lungs are clear. The visualized skeletal structures are unremarkable. IMPRESSION: No active cardiopulmonary disease. Electronically Signed   By: Andreas Newport M.D.   On: 11/07/2016 03:36   Ct Angio Chest Pe W And/or Wo Contrast  Result Date: 11/07/2016 CLINICAL DATA:  Chest and right upper quadrant abdominal pain for several days with a pleuritic component. EXAM: CT ANGIOGRAPHY CHEST CT ABDOMEN AND PELVIS WITH CONTRAST TECHNIQUE: Multidetector CT imaging of the chest was performed using the standard protocol during bolus administration of intravenous contrast. Multiplanar CT image reconstructions and MIPs were obtained to evaluate the vascular anatomy. Multidetector CT imaging of the abdomen and pelvis was performed using the standard protocol during bolus administration of intravenous  contrast. CONTRAST:  100 cc Isovue 370 IV. COMPARISON:  None. FINDINGS: CTA CHEST FINDINGS Cardiovascular: The study is high quality for the evaluation of pulmonary embolism. There are no filling defects in the central, lobar, segmental or subsegmental pulmonary artery branches to suggest acute pulmonary embolism. Great vessels are normal in course and caliber. Normal heart size. No significant pericardial fluid/thickening. Mediastinum/Nodes: No discrete thyroid nodules. Unremarkable esophagus. No pathologically enlarged axillary, mediastinal or hilar lymph nodes. Lungs/Pleura: No pneumothorax. No pleural effusion. There are numerous (greater than 15) solid round pulmonary nodules scattered throughout both lungs, which demonstrate an irregular contour with small ground-glass halos, largest 1.6 x 1.3 cm in the right middle lobe (series 4/ image 76) and 1.4 x 1.3 cm in the right lower lobe (series 4/ image 80). Musculoskeletal:  No aggressive appearing focal osseous lesions. Review of the MIP images confirms the above findings.  CT ABDOMEN and PELVIS FINDINGS Hepatobiliary: Normal liver with no liver mass. Cholecystectomy. No biliary ductal dilatation. Pancreas: Normal, with no mass or duct dilation. Spleen: Normal size. No mass. Adrenals/Urinary Tract: Normal adrenals. Normal kidneys with no hydronephrosis and no renal mass. Normal bladder. Stomach/Bowel: Grossly normal stomach. Normal caliber small bowel with no small bowel wall thickening. Normal appendix. Normal large bowel with no diverticulosis, large bowel wall thickening or pericolonic fat stranding. Vascular/Lymphatic: Mildly atherosclerotic nonaneurysmal abdominal aorta. Patent portal, splenic, hepatic and renal veins. No pathologically enlarged lymph nodes in the abdomen or pelvis. Reproductive: Grossly normal retroverted uterus.  No adnexal mass. Other: No pneumoperitoneum, ascites or focal fluid collection. Musculoskeletal: No aggressive appearing focal  osseous lesions. Review of the MIP images confirms the above findings. IMPRESSION: 1. No pulmonary embolism. 2. Numerous irregular round solid pulmonary nodules scattered throughout both lungs with small ground-glass halos, largest 1.6 cm in the right middle lobe. An atypical infectious or inflammatory etiology is suspected. Consider fungal pneumonia, septic emboli or pulmonary vasculitis. Metastatic disease is less likely, although not entirely excluded. Short term posttreatment follow-up chest CT advised. 3. No lymphadenopathy in the chest, abdomen or pelvis. 4. No acute abnormality in the abdomen or pelvis. 5. Aortic atherosclerosis. Electronically Signed   By: Ilona Sorrel M.D.   On: 11/07/2016 07:54   Ct Abdomen Pelvis W Contrast  Result Date: 11/07/2016 CLINICAL DATA:  Chest and right upper quadrant abdominal pain for several days with a pleuritic component. EXAM: CT ANGIOGRAPHY CHEST CT ABDOMEN AND PELVIS WITH CONTRAST TECHNIQUE: Multidetector CT imaging of the chest was performed using the standard protocol during bolus administration of intravenous contrast. Multiplanar CT image reconstructions and MIPs were obtained to evaluate the vascular anatomy. Multidetector CT imaging of the abdomen and pelvis was performed using the standard protocol during bolus administration of intravenous contrast. CONTRAST:  100 cc Isovue 370 IV. COMPARISON:  None. FINDINGS: CTA CHEST FINDINGS Cardiovascular: The study is high quality for the evaluation of pulmonary embolism. There are no filling defects in the central, lobar, segmental or subsegmental pulmonary artery branches to suggest acute pulmonary embolism. Great vessels are normal in course and caliber. Normal heart size. No significant pericardial fluid/thickening. Mediastinum/Nodes: No discrete thyroid nodules. Unremarkable esophagus. No pathologically enlarged axillary, mediastinal or hilar lymph nodes. Lungs/Pleura: No pneumothorax. No pleural effusion. There  are numerous (greater than 15) solid round pulmonary nodules scattered throughout both lungs, which demonstrate an irregular contour with small ground-glass halos, largest 1.6 x 1.3 cm in the right middle lobe (series 4/ image 76) and 1.4 x 1.3 cm in the right lower lobe (series 4/ image 80). Musculoskeletal:  No aggressive appearing focal osseous lesions. Review of the MIP images confirms the above findings. CT ABDOMEN and PELVIS FINDINGS Hepatobiliary: Normal liver with no liver mass. Cholecystectomy. No biliary ductal dilatation. Pancreas: Normal, with no mass or duct dilation. Spleen: Normal size. No mass. Adrenals/Urinary Tract: Normal adrenals. Normal kidneys with no hydronephrosis and no renal mass. Normal bladder. Stomach/Bowel: Grossly normal stomach. Normal caliber small bowel with no small bowel wall thickening. Normal appendix. Normal large bowel with no diverticulosis, large bowel wall thickening or pericolonic fat stranding. Vascular/Lymphatic: Mildly atherosclerotic nonaneurysmal abdominal aorta. Patent portal, splenic, hepatic and renal veins. No pathologically enlarged lymph nodes in the abdomen or pelvis. Reproductive: Grossly normal retroverted uterus.  No adnexal mass. Other: No pneumoperitoneum, ascites or focal fluid collection. Musculoskeletal: No aggressive appearing focal osseous lesions. Review of the MIP images confirms the above findings.  IMPRESSION: 1. No pulmonary embolism. 2. Numerous irregular round solid pulmonary nodules scattered throughout both lungs with small ground-glass halos, largest 1.6 cm in the right middle lobe. An atypical infectious or inflammatory etiology is suspected. Consider fungal pneumonia, septic emboli or pulmonary vasculitis. Metastatic disease is less likely, although not entirely excluded. Short term posttreatment follow-up chest CT advised. 3. No lymphadenopathy in the chest, abdomen or pelvis. 4. No acute abnormality in the abdomen or pelvis. 5. Aortic  atherosclerosis. Electronically Signed   By: Ilona Sorrel M.D.   On: 11/07/2016 07:54    EKG: Independently reviewed.   EKG Interpretation  Date/Time:  Sunday November 07 2016 01:12:32 EST Ventricular Rate:  84 PR Interval:  156 QRS Duration: 70 QT Interval:  356 QTC Calculation: 420 R Axis:   39 Text Interpretation:  Normal sinus rhythm Low voltage QRS Septal infarct , age undetermined Abnormal ECG anterior q waves new when compared to prior Confirmed by HORTON  MD, COURTNEY ([id number]) on 11/07/2016 1:28:22 AM        Assessment/Plan Principal Problem:   Pulmonary nodules Active Problems:   Chronic ulcerative proctitis (HCC)   Immunocompromised state (HCC)   Pleuritic chest pain   Hypokalemia   Anemia   1. Pulmonary nodules  - on CT; Numerous irregular round solid pulmonary nodules scattered throughout bilateral lungs with small groundglass appearance, largest 1.6 cm in the right middle lobe. - An atypical infectious or inflammatory process is suspected, per radiology reading - as cause of pleuritic chest pain? - obs tele - pulm/Dr Halford Chessman has been cs by ED, pt will mostlikely need Bronchoscopy for further dx.  - will await pulm recds, appreciate assistance - chk TB/quantiferon gold/fungal/afb and sputum cx/blood cx/fungal cx/urine legionella/strep/crp/esr - airborne precautions for now.  2. immunecompromised state, - has been on Remicade prior for UC, currently on Entyvio  3. Hx of UC protitis - stable  4. Htn, controlled - continue prinzide 10-12.5qd  5. Hypokalemia - likely from hctz, kdur 70mq x 1, rechk bmp in am  6. Anemia, normocytic - iron studies ordered  7. gerd Continue ppi  Pulm /Dr SHalford Chessmancs, ER called and talked to him.  Code Status: full  (must indicate code status--if unknown or must be presumed, indicate so) DVT Prophylaxis:  Hep 5 k tid, can be stopped prior to bronch per protocol Family Communication: patient, no family at bedside (indicate person  spoken with, if applicable, with phone number if by telephone) Disposition Plan: obs med  Time spent: 340ms  DaMaren ReamerD., MBA/MHA [facility] Pager [phone]

## 2016-11-07 NOTE — Consult Note (Signed)
Name: Gabrielle Wilkins MRN: [id number] DOB: 06-Dec-1975    ADMISSION DATE:  11/07/2016 CONSULTATION DATE:  11/07/16  REFERRING MD :  Dr. Janne Napoleon   CHIEF COMPLAINT:  Chest / epigastric pain   HISTORY OF PRESENT ILLNESS:  41 y/o F admitted on 3/11 with complaints of several days of chest / epigastric pain, worse with inspiration.  She has a history of ulcerative colitis on Entyvio (vedolizumab).  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  Her first dose was on Feb 16th and second dose on March 2nd.  She had a negative TB skin test prior to transition to [facility].   The patient reports she has had about 6 weeks of mid-sternal and right chest pain (under her right breast).  She was initially seen by her PCP for chest discomfort and an EKG was assessed which was negative.  Subsequently, she was treated as GERD with omeprazole.  Her prescription ran out and she felt better.  Later on 3/7, the pain returned and was intermittent.  She woke with 3/11 early am with chest pain/discomfort and sought care at the ER.    Initial labs - Na 136, K 3.4, Cl 100, sr cr 0.80, AG 12, troponin 0.0, WBC 10.8, hgb 9.8 and platelets 410.  CTA of the chest, abdomen & pelvis performed which was neg for PE but showed numerous irregular round solid pulmonary nodules bilaterally, largest 1.6 cm in the RML, no LAN in chest/abd/pelvis, no acute abnormality in the abdomen / pelvis.    Of note, she recently flew to [place] to visit her brother after quadruple bypass (he happens to be HIV positive and is compliant with therapy, no TB known).    The patient denies cough, fevers, chills, sputum production, weight loss, decreased appetite and night sweats.   PCCM consulted for evaluation.   PAST MEDICAL HISTORY :   has a past medical history of Depression; Hypertension; Migraine, ophthalmoplegic; Pregnancy induced hypertension; Ulcerative colitis; and Ulcerative colitis ([place]).   has a past surgical  history that includes Cholecystectomy (2008); Dilation and curettage of uterus; Cesarean section (04/05/2011); and Colonoscopy (N/A).  Prior to Admission medications   Medication Sig Start Date End Date Taking? Authorizing Provider  lisinopril-hydrochlorothiazide (PRINZIDE,ZESTORETIC) 10-12.5 MG per tablet Take 1 tablet by mouth daily.    Yes Historical Provider, MD  omeprazole (PRILOSEC) 40 MG capsule Take 40 mg by mouth daily.   Yes Historical Provider, MD  vedolizumab (ENTYVIO) 300 MG injection Inject 300 mg into the vein every 14 (fourteen) days.   Yes Historical Provider, MD    Allergies  Allergen Reactions  . Ceclor [Cefaclor] Hives    Patient said she can tolerate penicillin.  Hasn't had Keflex.     FAMILY HISTORY:  family history includes Alcoholism in her father; Arthritis/Rheumatoid in her brother; Bipolar disorder in her brother; Cirrhosis in her father; Dementia in her mother; HIV in her brother; Heart attack in her father; Hypertension in her brother, brother, brother, and mother; Pancreatic cancer in her mother.  SOCIAL HISTORY:  reports that she has never smoked. She has never used smokeless tobacco. She reports that she drinks about 1.8 oz of alcohol per week . She reports that she does not use drugs.  REVIEW OF SYSTEMS:  POSITIVES IN BOLD Constitutional: Negative for fever, chills, weight loss, malaise/fatigue and diaphoresis.  HENT: Negative for hearing loss, ear pain, nosebleeds, congestion, sore throat, neck pain, tinnitus and ear discharge.   Eyes: Negative for blurred vision,  double vision, photophobia, pain, discharge and redness.  Respiratory: Negative for cough, hemoptysis, sputum production, shortness of breath, wheezing and stridor.   Cardiovascular: Negative for chest pain, palpitations, orthopnea, claudication, leg swelling and PND.  Gastrointestinal: Negative for heartburn, nausea, vomiting, abdominal pain, diarrhea, constipation, blood in stool and melena.    Genitourinary: Negative for dysuria, urgency, frequency, hematuria and flank pain.  Musculoskeletal: Negative for myalgias, back pain, joint pain and falls.  Skin: Negative for itching and rash.  Neurological: Negative for dizziness, tingling, tremors, sensory change, speech change, focal weakness, seizures, loss of consciousness, weakness and headaches.  Endo/Heme/Allergies: Negative for environmental allergies and polydipsia. Does not bruise/bleed easily.  SUBJECTIVE:   VITAL SIGNS: Temp:  [98.9 F (37.2 C)] 98.9 F (37.2 C) (03/11 1219) Pulse Rate:  [71-88] 85 (03/11 1219) Resp:  [12-22] 18 (03/11 1015) BP: (109-139)/(76-102) 139/102 (03/11 1219) SpO2:  [97 %-100 %] 99 % (03/11 1219)  PHYSICAL EXAMINATION: General: well developed adult female in NAD HEENT: MM pink/moist Neuro: AAOx4, speech clear, MAE CV: s1s2 rrr, no m/r/g PULM: even/non-labored, lungs bilaterally clear JJ:OACZ, non-tender, bsx4 active  Extremities: warm/dry, no edema  Skin: no rashes or lesions    Recent Labs Lab 11/07/16 0107 11/07/16 0912  NA 136  --   K 3.4*  --   CL 100*  --   CO2 24  --   BUN 9  --   CREATININE 0.80 0.62  GLUCOSE 94  --      Recent Labs Lab 11/07/16 0107 11/07/16 0912  HGB 10.1* 9.8*  HCT 30.6* 30.0*  WBC 11.2* 10.8*  PLT 424* 410*    Dg Chest 2 View  Result Date: 11/07/2016 CLINICAL DATA:  Epigastric and right-sided abdominal pain for several days. EXAM: CHEST  2 VIEW COMPARISON:  None. FINDINGS: The heart size and mediastinal contours are within normal limits. Both lungs are clear. The visualized skeletal structures are unremarkable. IMPRESSION: No active cardiopulmonary disease. Electronically Signed   By: Andreas Newport M.D.   On: 11/07/2016 03:36   Ct Angio Chest Pe W And/or Wo Contrast  Result Date: 11/07/2016 CLINICAL DATA:  Chest and right upper quadrant abdominal pain for several days with a pleuritic component. EXAM: CT ANGIOGRAPHY CHEST CT ABDOMEN  AND PELVIS WITH CONTRAST TECHNIQUE: Multidetector CT imaging of the chest was performed using the standard protocol during bolus administration of intravenous contrast. Multiplanar CT image reconstructions and MIPs were obtained to evaluate the vascular anatomy. Multidetector CT imaging of the abdomen and pelvis was performed using the standard protocol during bolus administration of intravenous contrast. CONTRAST:  100 cc Isovue 370 IV. COMPARISON:  None. FINDINGS: CTA CHEST FINDINGS Cardiovascular: The study is high quality for the evaluation of pulmonary embolism. There are no filling defects in the central, lobar, segmental or subsegmental pulmonary artery branches to suggest acute pulmonary embolism. Great vessels are normal in course and caliber. Normal heart size. No significant pericardial fluid/thickening. Mediastinum/Nodes: No discrete thyroid nodules. Unremarkable esophagus. No pathologically enlarged axillary, mediastinal or hilar lymph nodes. Lungs/Pleura: No pneumothorax. No pleural effusion. There are numerous (greater than 15) solid round pulmonary nodules scattered throughout both lungs, which demonstrate an irregular contour with small ground-glass halos, largest 1.6 x 1.3 cm in the right middle lobe (series 4/ image 76) and 1.4 x 1.3 cm in the right lower lobe (series 4/ image 80). Musculoskeletal:  No aggressive appearing focal osseous lesions. Review of the MIP images confirms the above findings. CT ABDOMEN and PELVIS FINDINGS Hepatobiliary:  Normal liver with no liver mass. Cholecystectomy. No biliary ductal dilatation. Pancreas: Normal, with no mass or duct dilation. Spleen: Normal size. No mass. Adrenals/Urinary Tract: Normal adrenals. Normal kidneys with no hydronephrosis and no renal mass. Normal bladder. Stomach/Bowel: Grossly normal stomach. Normal caliber small bowel with no small bowel wall thickening. Normal appendix. Normal large bowel with no diverticulosis, large bowel wall  thickening or pericolonic fat stranding. Vascular/Lymphatic: Mildly atherosclerotic nonaneurysmal abdominal aorta. Patent portal, splenic, hepatic and renal veins. No pathologically enlarged lymph nodes in the abdomen or pelvis. Reproductive: Grossly normal retroverted uterus.  No adnexal mass. Other: No pneumoperitoneum, ascites or focal fluid collection. Musculoskeletal: No aggressive appearing focal osseous lesions. Review of the MIP images confirms the above findings. IMPRESSION: 1. No pulmonary embolism. 2. Numerous irregular round solid pulmonary nodules scattered throughout both lungs with small ground-glass halos, largest 1.6 cm in the right middle lobe. An atypical infectious or inflammatory etiology is suspected. Consider fungal pneumonia, septic emboli or pulmonary vasculitis. Metastatic disease is less likely, although not entirely excluded. Short term posttreatment follow-up chest CT advised. 3. No lymphadenopathy in the chest, abdomen or pelvis. 4. No acute abnormality in the abdomen or pelvis. 5. Aortic atherosclerosis. Electronically Signed   By: Ilona Sorrel M.D.   On: 11/07/2016 07:54   Ct Abdomen Pelvis W Contrast  Result Date: 11/07/2016 CLINICAL DATA:  Chest and right upper quadrant abdominal pain for several days with a pleuritic component. EXAM: CT ANGIOGRAPHY CHEST CT ABDOMEN AND PELVIS WITH CONTRAST TECHNIQUE: Multidetector CT imaging of the chest was performed using the standard protocol during bolus administration of intravenous contrast. Multiplanar CT image reconstructions and MIPs were obtained to evaluate the vascular anatomy. Multidetector CT imaging of the abdomen and pelvis was performed using the standard protocol during bolus administration of intravenous contrast. CONTRAST:  100 cc Isovue 370 IV. COMPARISON:  None. FINDINGS: CTA CHEST FINDINGS Cardiovascular: The study is high quality for the evaluation of pulmonary embolism. There are no filling defects in the central,  lobar, segmental or subsegmental pulmonary artery branches to suggest acute pulmonary embolism. Great vessels are normal in course and caliber. Normal heart size. No significant pericardial fluid/thickening. Mediastinum/Nodes: No discrete thyroid nodules. Unremarkable esophagus. No pathologically enlarged axillary, mediastinal or hilar lymph nodes. Lungs/Pleura: No pneumothorax. No pleural effusion. There are numerous (greater than 15) solid round pulmonary nodules scattered throughout both lungs, which demonstrate an irregular contour with small ground-glass halos, largest 1.6 x 1.3 cm in the right middle lobe (series 4/ image 76) and 1.4 x 1.3 cm in the right lower lobe (series 4/ image 80). Musculoskeletal:  No aggressive appearing focal osseous lesions. Review of the MIP images confirms the above findings. CT ABDOMEN and PELVIS FINDINGS Hepatobiliary: Normal liver with no liver mass. Cholecystectomy. No biliary ductal dilatation. Pancreas: Normal, with no mass or duct dilation. Spleen: Normal size. No mass. Adrenals/Urinary Tract: Normal adrenals. Normal kidneys with no hydronephrosis and no renal mass. Normal bladder. Stomach/Bowel: Grossly normal stomach. Normal caliber small bowel with no small bowel wall thickening. Normal appendix. Normal large bowel with no diverticulosis, large bowel wall thickening or pericolonic fat stranding. Vascular/Lymphatic: Mildly atherosclerotic nonaneurysmal abdominal aorta. Patent portal, splenic, hepatic and renal veins. No pathologically enlarged lymph nodes in the abdomen or pelvis. Reproductive: Grossly normal retroverted uterus.  No adnexal mass. Other: No pneumoperitoneum, ascites or focal fluid collection. Musculoskeletal: No aggressive appearing focal osseous lesions. Review of the MIP images confirms the above findings. IMPRESSION: 1. No pulmonary embolism. 2.  Numerous irregular round solid pulmonary nodules scattered throughout both lungs with small ground-glass  halos, largest 1.6 cm in the right middle lobe. An atypical infectious or inflammatory etiology is suspected. Consider fungal pneumonia, septic emboli or pulmonary vasculitis. Metastatic disease is less likely, although not entirely excluded. Short term posttreatment follow-up chest CT advised. 3. No lymphadenopathy in the chest, abdomen or pelvis. 4. No acute abnormality in the abdomen or pelvis. 5. Aortic atherosclerosis. Electronically Signed   By: Ilona Sorrel M.D.   On: 11/07/2016 07:54      SIGNIFICANT EVENTS  3/11 Admit with chest / epigastric pain.  CT with multiple irregular solid pulmonary nodules  STUDIES:  CTA Chest, ABD/Pelvis 3/11 >> neg for PE, numerous irregular round solid pulmonary nodules bilaterally, largest 1.6 cm in the RML, no LAN in chest/abd/pelvis, no acute abnormality in the abdomen / pelvis ESR 3/11 >> 47   ASSESSMENT / PLAN:  41 y/o F on immune suppression for ulcerative colitis with recent change in therapy who presented with chest pain.  CT with bilateral pulmonary nodules, PE ruled out.  DDx includes bacterial / fungal etiologies, autoimmune, less likely malignant but can not rule out without tissue sampling.  No LAN on CT.      Bilateral Pulmonary Nodules   Plan: Arrange for FOB for tissue sampling / washings, will not be able to schedule until am 3/12 Discussed with patient to remain only clear liquids until timing can be determined Continue airborne precautions Assess Quantiferon Gold  Pt unable to produce sputum Follow blood cultures Hold autoimmune work up for now   Noe Gens, NP-C [place] Pgr: [phone] or if no answer [phone] 11/07/2016, 2:45 PM

## 2016-11-07 NOTE — ED Provider Notes (Signed)
[facility] DEPT Provider Note   CSN: [id number] Arrival date & time: 11/07/16  0053   By signing my name below, I, Soijett Blue, attest that this documentation has been prepared under the direction and in the presence of Merryl Hacker, MD. Electronically Signed: Wilderness Rim, ED Scribe. 11/07/16. 3:13 AM.  History   Chief Complaint Chief Complaint  Patient presents with  . Abdominal Pain    HPI Gabrielle Wilkins is a 41 y.o. female with a PMHx of ulcerative colitis, HTN, who presents to the Emergency Department complaining of 7/10, Chest pain/epigastric abdominal pain onset 11 PM last night. Pain is worsened with deep breathing and palpation. Pt has tried omeprazole and ontivio with no relief of her symptoms. She states that she was evaluated by her GI specialist and prescribed omeprazole and ontivio for her hx of ulcerative colitis. She denies SOB, fever, cough, vomiting, diarrhea, and any other symptoms. Pt denies estrogen use, birth control use, PMHx of blood clots, hx of GERD, or leg swelling. Pt reports that she has had a cholecystectomy.    The history is provided by the patient. No language interpreter was used.    Past Medical History:  Diagnosis Date  . Depression   . Hypertension    Chronic Hypertension  . Migraine, ophthalmoplegic   . Pregnancy induced hypertension   . Ulcerative colitis   . Ulcerative colitis [facility])     Patient Active Problem List   Diagnosis Date Noted  . Ulcerative (chronic) proctitis with rectal bleeding ([place]) 09/05/2015  . 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 CESAREAN SECTION  04/05/2011   Procedure: CESAREAN SECTION;  Surgeon: Logan Bores;  Location: [facility];  Service: Gynecology;  Laterality: N/A;  baby boy 62  APGAR 7/9  . CHOLECYSTECTOMY  2008  . COLONOSCOPY N/A   . DILATION AND CURETTAGE OF UTERUS      OB History    Gravida Para Term Preterm AB Living   5 3  2 1 2 3    SAB TAB Ectopic Multiple Live Births   2 0 0 0 3       Home Medications    Prior to Admission medications   Medication Sig Start Date End Date Taking? Authorizing Provider  lisinopril-hydrochlorothiazide (PRINZIDE,ZESTORETIC) 10-12.5 MG per tablet Take 1 tablet by mouth daily.    Yes Historical Provider, MD  omeprazole (PRILOSEC) 40 MG capsule Take 40 mg by mouth daily.   Yes Historical Provider, MD  vedolizumab (ENTYVIO) 300 MG injection Inject 300 mg into the vein every 14 (fourteen) days.   Yes Historical Provider, MD    Family History Family History  Problem Relation Age of Onset  . Pancreatic cancer Mother   . Dementia Mother   . Hypertension Mother   . Heart attack Father   . Alcoholism Father   . Cirrhosis Father   . Hypertension Brother   . Arthritis/Rheumatoid Brother   . Hypertension Brother   . HIV Brother   . Bipolar disorder Brother   . Hypertension Brother     Social History Social History  Substance Use Topics  . Smoking status: Never Smoker  . Smokeless tobacco: Never Used  . Alcohol use 1.8 oz/week    3 Standard drinks or equivalent per week     Allergies   Ceclor [cefaclor]   Review of Systems Review of Systems  Constitutional: Negative for fever.  Respiratory: Negative for cough and shortness  of breath.   Gastrointestinal: Positive for abdominal pain (epigastric). Negative for diarrhea and vomiting.  Genitourinary: Negative for dysuria.     Physical Exam Updated Vital Signs BP 112/92   Pulse 87   Resp 13   LMP 11/03/2016 (Exact Date)   SpO2 97%   Physical Exam  Constitutional: She is oriented to person, place, and time. She appears well-developed and well-nourished. No distress.  HENT:  Head: Normocephalic and atraumatic.  Cardiovascular: Normal rate, regular rhythm and normal heart sounds.   Pulmonary/Chest: Effort normal and breath sounds normal. No respiratory distress. She has no wheezes.  Abdominal: Soft. Bowel  sounds are normal. She exhibits no mass. There is tenderness. There is no rebound and no guarding.  Epigastric tenderness to palpation without rebound or guarding  Neurological: She is alert and oriented to person, place, and time.  Skin: Skin is warm and dry.  Psychiatric: She has a normal mood and affect.  Nursing note and vitals reviewed.    ED Treatments / Results  DIAGNOSTIC STUDIES: Oxygen Saturation is 100% on RA, nl by my interpretation.    COORDINATION OF CARE: 3:12 AM Discussed treatment plan with pt at bedside which includes labs, UA, CXR, and pt agreed to plan.   Labs (all labs ordered are listed, but only abnormal results are displayed) Labs Reviewed  BASIC METABOLIC PANEL - Abnormal; Notable for the following:       Result Value   Potassium 3.4 (*)    Chloride 100 (*)    Calcium 8.8 (*)    All other components within normal limits  CBC - Abnormal; Notable for the following:    WBC 11.2 (*)    RBC 3.63 (*)    Hemoglobin 10.1 (*)    HCT 30.6 (*)    Platelets 424 (*)    All other components within normal limits  URINALYSIS, ROUTINE W REFLEX MICROSCOPIC - Abnormal; Notable for the following:    APPearance HAZY (*)    Hgb urine dipstick SMALL (*)    Ketones, ur 5 (*)    Leukocytes, UA TRACE (*)    Squamous Epithelial / LPF 0-5 (*)    All other components within normal limits  D-DIMER, QUANTITATIVE (NOT AT [facility]) - Abnormal; Notable for the following:    D-Dimer, Quant 1.02 (*)    All other components within normal limits  HEPATIC FUNCTION PANEL - Abnormal; Notable for the following:    Albumin 3.2 (*)    AST 47 (*)    Bilirubin, Direct <0.1 (*)    All other components within normal limits  LIPASE, BLOOD  PREGNANCY, URINE  I-STAT TROPOININ, ED  POC URINE PREG, ED    EKG  EKG Interpretation  Date/Time:  Sunday November 07 2016 01:12:32 EST Ventricular Rate:  84 PR Interval:  156 QRS Duration: 70 QT Interval:  356 QTC Calculation: 420 R  Axis:   39 Text Interpretation:  Normal sinus rhythm Low voltage QRS Septal infarct , age undetermined Abnormal ECG anterior q waves new when compared to prior Confirmed by Kaylla Cobos  MD, Edilia Ghuman ([id number]) on 11/07/2016 1:28:22 AM       Radiology Dg Chest 2 View  Result Date: 11/07/2016 CLINICAL DATA:  Epigastric and right-sided abdominal pain for several days. EXAM: CHEST  2 VIEW COMPARISON:  None. FINDINGS: The heart size and mediastinal contours are within normal limits. Both lungs are clear. The visualized skeletal structures are unremarkable. IMPRESSION: No active cardiopulmonary disease. Electronically Signed   By: Quillian Quince  Armandina Stammer M.D.   On: 11/07/2016 03:36   Ct Angio Chest Pe W And/or Wo Contrast  Result Date: 11/07/2016 CLINICAL DATA:  Chest and right upper quadrant abdominal pain for several days with a pleuritic component. EXAM: CT ANGIOGRAPHY CHEST CT ABDOMEN AND PELVIS WITH CONTRAST TECHNIQUE: Multidetector CT imaging of the chest was performed using the standard protocol during bolus administration of intravenous contrast. Multiplanar CT image reconstructions and MIPs were obtained to evaluate the vascular anatomy. Multidetector CT imaging of the abdomen and pelvis was performed using the standard protocol during bolus administration of intravenous contrast. CONTRAST:  100 cc Isovue 370 IV. COMPARISON:  None. FINDINGS: CTA CHEST FINDINGS Cardiovascular: The study is high quality for the evaluation of pulmonary embolism. There are no filling defects in the central, lobar, segmental or subsegmental pulmonary artery branches to suggest acute pulmonary embolism. Great vessels are normal in course and caliber. Normal heart size. No significant pericardial fluid/thickening. Mediastinum/Nodes: No discrete thyroid nodules. Unremarkable esophagus. No pathologically enlarged axillary, mediastinal or hilar lymph nodes. Lungs/Pleura: No pneumothorax. No pleural effusion. There are numerous (greater than  15) solid round pulmonary nodules scattered throughout both lungs, which demonstrate an irregular contour with small ground-glass halos, largest 1.6 x 1.3 cm in the right middle lobe (series 4/ image 76) and 1.4 x 1.3 cm in the right lower lobe (series 4/ image 80). Musculoskeletal:  No aggressive appearing focal osseous lesions. Review of the MIP images confirms the above findings. CT ABDOMEN and PELVIS FINDINGS Hepatobiliary: Normal liver with no liver mass. Cholecystectomy. No biliary ductal dilatation. Pancreas: Normal, with no mass or duct dilation. Spleen: Normal size. No mass. Adrenals/Urinary Tract: Normal adrenals. Normal kidneys with no hydronephrosis and no renal mass. Normal bladder. Stomach/Bowel: Grossly normal stomach. Normal caliber small bowel with no small bowel wall thickening. Normal appendix. Normal large bowel with no diverticulosis, large bowel wall thickening or pericolonic fat stranding. Vascular/Lymphatic: Mildly atherosclerotic nonaneurysmal abdominal aorta. Patent portal, splenic, hepatic and renal veins. No pathologically enlarged lymph nodes in the abdomen or pelvis. Reproductive: Grossly normal retroverted uterus.  No adnexal mass. Other: No pneumoperitoneum, ascites or focal fluid collection. Musculoskeletal: No aggressive appearing focal osseous lesions. Review of the MIP images confirms the above findings. IMPRESSION: 1. No pulmonary embolism. 2. Numerous irregular round solid pulmonary nodules scattered throughout both lungs with small ground-glass halos, largest 1.6 cm in the right middle lobe. An atypical infectious or inflammatory etiology is suspected. Consider fungal pneumonia, septic emboli or pulmonary vasculitis. Metastatic disease is less likely, although not entirely excluded. Short term posttreatment follow-up chest CT advised. 3. No lymphadenopathy in the chest, abdomen or pelvis. 4. No acute abnormality in the abdomen or pelvis. 5. Aortic atherosclerosis.  Electronically Signed   By: Ilona Sorrel M.D.   On: 11/07/2016 07:54   Ct Abdomen Pelvis W Contrast  Result Date: 11/07/2016 CLINICAL DATA:  Chest and right upper quadrant abdominal pain for several days with a pleuritic component. EXAM: CT ANGIOGRAPHY CHEST CT ABDOMEN AND PELVIS WITH CONTRAST TECHNIQUE: Multidetector CT imaging of the chest was performed using the standard protocol during bolus administration of intravenous contrast. Multiplanar CT image reconstructions and MIPs were obtained to evaluate the vascular anatomy. Multidetector CT imaging of the abdomen and pelvis was performed using the standard protocol during bolus administration of intravenous contrast. CONTRAST:  100 cc Isovue 370 IV. COMPARISON:  None. FINDINGS: CTA CHEST FINDINGS Cardiovascular: The study is high quality for the evaluation of pulmonary embolism. There are no filling  defects in the central, lobar, segmental or subsegmental pulmonary artery branches to suggest acute pulmonary embolism. Great vessels are normal in course and caliber. Normal heart size. No significant pericardial fluid/thickening. Mediastinum/Nodes: No discrete thyroid nodules. Unremarkable esophagus. No pathologically enlarged axillary, mediastinal or hilar lymph nodes. Lungs/Pleura: No pneumothorax. No pleural effusion. There are numerous (greater than 15) solid round pulmonary nodules scattered throughout both lungs, which demonstrate an irregular contour with small ground-glass halos, largest 1.6 x 1.3 cm in the right middle lobe (series 4/ image 76) and 1.4 x 1.3 cm in the right lower lobe (series 4/ image 80). Musculoskeletal:  No aggressive appearing focal osseous lesions. Review of the MIP images confirms the above findings. CT ABDOMEN and PELVIS FINDINGS Hepatobiliary: Normal liver with no liver mass. Cholecystectomy. No biliary ductal dilatation. Pancreas: Normal, with no mass or duct dilation. Spleen: Normal size. No mass. Adrenals/Urinary Tract:  Normal adrenals. Normal kidneys with no hydronephrosis and no renal mass. Normal bladder. Stomach/Bowel: Grossly normal stomach. Normal caliber small bowel with no small bowel wall thickening. Normal appendix. Normal large bowel with no diverticulosis, large bowel wall thickening or pericolonic fat stranding. Vascular/Lymphatic: Mildly atherosclerotic nonaneurysmal abdominal aorta. Patent portal, splenic, hepatic and renal veins. No pathologically enlarged lymph nodes in the abdomen or pelvis. Reproductive: Grossly normal retroverted uterus.  No adnexal mass. Other: No pneumoperitoneum, ascites or focal fluid collection. Musculoskeletal: No aggressive appearing focal osseous lesions. Review of the MIP images confirms the above findings. IMPRESSION: 1. No pulmonary embolism. 2. Numerous irregular round solid pulmonary nodules scattered throughout both lungs with small ground-glass halos, largest 1.6 cm in the right middle lobe. An atypical infectious or inflammatory etiology is suspected. Consider fungal pneumonia, septic emboli or pulmonary vasculitis. Metastatic disease is less likely, although not entirely excluded. Short term posttreatment follow-up chest CT advised. 3. No lymphadenopathy in the chest, abdomen or pelvis. 4. No acute abnormality in the abdomen or pelvis. 5. Aortic atherosclerosis. Electronically Signed   By: Ilona Sorrel M.D.   On: 11/07/2016 07:54    Procedures Procedures (including critical care time)  Medications Ordered in ED Medications  HYDROmorphone (DILAUDID) injection 1 mg (1 mg Intravenous Given 11/07/16 0358)  ondansetron (ZOFRAN) injection 4 mg (4 mg Intravenous Given 11/07/16 0358)  HYDROmorphone (DILAUDID) injection 1 mg (1 mg Intravenous Given 11/07/16 0601)  iopamidol (ISOVUE-370) 76 % injection (100 mLs  Contrast Given 11/07/16 0618)  oxyCODONE-acetaminophen (PERCOCET/ROXICET) 5-325 MG per tablet 1 tablet (1 tablet Oral Given 11/07/16 0827)     Initial Impression /  Assessment and Plan / ED Course  I have reviewed the triage vital signs and the nursing notes.  Pertinent labs & imaging results that were available during my care of the patient were reviewed by me and considered in my medical decision making (see chart for details).    Patient presents with pleuritic chest pain and epigastric pain. Currently on omeprazole. She is also on a biologic for ulcerative colitis. Pain is somewhat hard to localize. She does have tenderness on exam but does explicitly stated that she has chest pain with deep breaths. Lab work obtained. EKG reassuring. Dimer is positive. Patient given pain and nausea medication. She was redosed several times. CT scan chest and abdomen obtained given positive d-dimer and additional abdominal pain. CT scan is negative for PE but is notable for multiple bilateral pulmonary nodules the greatest being 7.6 cm. Likely inflammatory or reactive but brought differential suspected. She is in a biologic. She denies any infectious symptoms  including cough or fever. She reports negative TB test ring this biologic one month ago. I discussed with pulmonology, Dr. Halford Chessman who recommends expedited inpatient workup with bronchoscopy. Pulmonology will see the patient and provide formal recommendations. I have updated the patient.  Final Clinical Impressions(s) / ED Diagnoses   Final diagnoses:  Pleuritic pain  Pulmonary nodules    New Prescriptions New Prescriptions   No medications on file   I personally performed the services described in this documentation, which was scribed in my presence. The recorded information has been reviewed and is accurate.     Merryl Hacker, MD 11/07/16 [phone]

## 2016-11-07 NOTE — ED Notes (Signed)
Patient transported to CT 

## 2016-11-07 NOTE — ED Notes (Signed)
Pt. Able to tolerate fluid at this time.

## 2016-11-07 NOTE — ED Triage Notes (Signed)
Patient with epigastric pain which has migrated under right breast.  Patient has seen GI and was given omeprazole and Ontivio for UC.  No nausea or vomiting.  Patient can't take a deep breath.

## 2016-11-08 ENCOUNTER — Observation Stay (HOSPITAL_COMMUNITY): Payer: BC Managed Care – PPO

## 2016-11-08 ENCOUNTER — Encounter (HOSPITAL_COMMUNITY)
Admission: EM | Disposition: A | Discharge status: Home / Self Care | Payer: Self-pay | Source: Home / Self Care | Attending: Internal Medicine

## 2016-11-08 ENCOUNTER — Encounter (HOSPITAL_COMMUNITY): Payer: Self-pay | Admitting: Pulmonary Disease

## 2016-11-08 DIAGNOSIS — R0781 Pleurodynia: Secondary | ICD-10-CM | POA: Diagnosis not present

## 2016-11-08 DIAGNOSIS — R918 Other nonspecific abnormal finding of lung field: Secondary | ICD-10-CM | POA: Diagnosis not present

## 2016-11-08 DIAGNOSIS — R0902 Hypoxemia: Secondary | ICD-10-CM

## 2016-11-08 DIAGNOSIS — A150 Tuberculosis of lung: Secondary | ICD-10-CM | POA: Diagnosis not present

## 2016-11-08 DIAGNOSIS — E876 Hypokalemia: Secondary | ICD-10-CM

## 2016-11-08 DIAGNOSIS — D849 Immunodeficiency, unspecified: Secondary | ICD-10-CM | POA: Diagnosis not present

## 2016-11-08 HISTORY — PX: VIDEO BRONCHOSCOPY: SHX5072

## 2016-11-08 LAB — LEGIONELLA PNEUMOPHILA SEROGP 1 UR AG: L. PNEUMOPHILA SEROGP 1 UR AG: NEGATIVE

## 2016-11-08 LAB — COMPREHENSIVE METABOLIC PANEL
ALBUMIN: 3.4 g/dL — AB (ref 3.5–5.0)
ALK PHOS: 130 U/L — AB (ref 38–126)
ALT: 58 U/L — ABNORMAL HIGH (ref 14–54)
ANION GAP: 9 (ref 5–15)
AST: 62 U/L — AB (ref 15–41)
CO2: 25 mmol/L (ref 22–32)
Calcium: 8.6 mg/dL — ABNORMAL LOW (ref 8.9–10.3)
Chloride: 103 mmol/L (ref 101–111)
Creatinine, Ser: 0.6 mg/dL (ref 0.44–1.00)
GFR calc Af Amer: >60 mL/min (ref >60)
GFR calc non Af Amer: >60 mL/min (ref >60)
GLUCOSE: 88 mg/dL (ref 65–99)
POTASSIUM: 4 mmol/L (ref 3.5–5.1)
SODIUM: 137 mmol/L (ref 135–145)
Total Bilirubin: 0.8 mg/dL (ref 0.3–1.2)
Total Protein: 6.7 g/dL (ref 6.5–8.1)

## 2016-11-08 LAB — HIV ANTIBODY (ROUTINE TESTING W REFLEX): HIV Screen 4th Generation wRfx: NONREACTIVE

## 2016-11-08 SURGERY — VIDEO BRONCHOSCOPY WITHOUT FLUORO
Anesthesia: Moderate Sedation | Laterality: Bilateral

## 2016-11-08 MED ORDER — LIDOCAINE HCL 2 % EX GEL
CUTANEOUS | Status: DC | PRN
  Administered 2016-11-08: 1

## 2016-11-08 MED ORDER — SODIUM CHLORIDE 0.9 % IV SOLN
Freq: Once | INTRAVENOUS | Status: AC
  Administered 2016-11-08: 14:00:00 via INTRAVENOUS

## 2016-11-08 MED ORDER — MIDAZOLAM HCL 5 MG/ML IJ SOLN
INTRAMUSCULAR | Status: AC
  Filled 2016-11-08: qty 2

## 2016-11-08 MED ORDER — FENTANYL CITRATE (PF) 100 MCG/2ML IJ SOLN
INTRAMUSCULAR | Status: DC | PRN
  Administered 2016-11-08: 25 ug via INTRAVENOUS
  Administered 2016-11-08 (×2): 50 ug via INTRAVENOUS
  Administered 2016-11-08: 25 ug via INTRAVENOUS

## 2016-11-08 MED ORDER — FENTANYL CITRATE (PF) 100 MCG/2ML IJ SOLN
INTRAMUSCULAR | Status: AC
  Filled 2016-11-08: qty 4

## 2016-11-08 MED ORDER — MIDAZOLAM HCL 10 MG/2ML IJ SOLN
INTRAMUSCULAR | Status: DC | PRN
  Administered 2016-11-08: 1 mg via INTRAVENOUS
  Administered 2016-11-08: .5 mg via INTRAVENOUS
  Administered 2016-11-08: 1 mg via INTRAVENOUS
  Administered 2016-11-08: .5 mg via INTRAVENOUS

## 2016-11-08 MED ORDER — PHENYLEPHRINE HCL 0.25 % NA SOLN
NASAL | Status: DC | PRN
  Administered 2016-11-08: 2 via NASAL

## 2016-11-08 MED ORDER — LIDOCAINE HCL (PF) 1 % IJ SOLN
INTRAMUSCULAR | Status: DC | PRN
  Administered 2016-11-08: 6 mL

## 2016-11-08 NOTE — Progress Notes (Addendum)
Courtesy Note.  I was informed about the patient's admission by the patient.  She is well-known to me for her history of a very limited Ulcerative Proctitis.  Initially she was controlled with oral immunosuppressives and 5-ASA, but her symptoms became refractory to the medications.  The decision was then made to start her on Remicade and she responded very well to the medication for a number of years.  When she started to exhibit signs of nonresponse, a second opinion consultation was obtained from [facility].  They concurred with the management to increase her Remicade dosing.  Subsequently she was tapered back down to her baseline dosing.  Over the past year she has suffered with a cutaneous yeast infection, inverse psoriasis, and flares of her Ulcerative Proctitis.  Her diagnosis of inverse psoriasis when she was referred to Dermatology and the conclusion was that her symptoms were secondary to Remicade.  She was discontinued on Remicade and recently started on Entyvio.  Just before the weekend she called me about some hematochezia without diarrhea.  In fact, she had some harder stool.  Also, she mentioned some epigastric pain and it seemed to be responsive to omeprazole.  She was to follow up today in the office for further evaluation.    I reviewed her chart in detail.  She is to undergo a bronchoscopy for her pulmonary findings.

## 2016-11-08 NOTE — Progress Notes (Signed)
PROGRESS NOTE    Gabrielle Wilkins  XHB:[id number] DOB: [birth year] DOA: 11/07/2016 PCP: Gerrit Heck, MD   Outpatient Specialists:     Brief Narrative:  Gabrielle Wilkins is a 41 y.o. female with significant past medical history of HTN, ulcerative colitis, has been started on Entyvio for UC for the past 3-4 week, has also been on Remicade in past, now presents to the ED secondary to severe midepigastric/pleuritic chest pain that woke her up last night around 11:00pm.  Pt states about 6 weeks ago she had milder pain in the mid epigastric region and her primary care doctor started her on omeprazole.  She finished that about 2 weeks ago, however the MEG pains restarted this past week.  She called her GI doctor,  who started her on omeprazole again this last week. She denies any nausea or vomiting.  Denies any fevers or chills, cough, proximal nocturnal dyspnea. Denies any diarrhea. Of note, she said she had a TB skin test about a month ago and it was negative prior to starting Entyvio.  No sick contacts, she was in [place] last month.    Assessment & Plan:   Principal Problem:   Pulmonary nodules Active Problems:   Chronic ulcerative proctitis (HCC)   Immunocompromised state (HCC)   Pleuritic chest pain   Hypokalemia   Anemia   Pulmonary nodules  - on CT; Numerous irregular round solid pulmonary nodules scattered throughout bilateral lungs with small groundglass appearance, largest 1.6 cm in the right middle lobe. - An atypical infectious or inflammatory process is suspected, per radiology reading - appreciate pulm consult-- bronch today - chk TB/quantiferon gold/fungal/afb and sputum cx/blood cx/fungal cx/urine legionella/strep/crp/esr - airborne precautions for now.  immunecompromised state, - has been on Remicade prior for UC, currently on Entyvio -negative TB test 2 weeks ago  Htn, controlled - continue prinzide 10-12.5qd  Hypokalemia - likely from hctz, kdur 45mq x  1, rechk bmp in am  Anemia, normocytic - PO iron replacement  gerd Continue ppi  Elevated TSH and free T4 -outpatient follow up   DVT prophylaxis:  SQ Heparin  Code Status: Full Code   Family Communication:   Disposition Plan:     Consultants:  pulm  Subjective: Still pain with deep breathing Headache   Objective: Vitals:   11/07/16 1219 11/07/16 2121 11/08/16 0616 11/08/16 0926  BP: (!) 139/102 122/89 117/81 125/82  Pulse: 85 69 77 80  Resp:  18 18   Temp: 98.9 F (37.2 C) 98.3 F (36.8 C) 98.5 F (36.9 C)   TempSrc: Oral Oral Oral   SpO2: 99% 99% 100%    No intake or output data in the 24 hours ending 11/08/16 1227 There were no vitals filed for this visit.  Examination:  General exam: Appears calm and comfortable  Respiratory system: Clear to auscultation. Respiratory effort normal. Cardiovascular system: S1 & S2 heard, RRR. No JVD, murmurs, rubs, gallops or clicks. No pedal edema. Gastrointestinal system: Abdomen is nondistended, soft and nontender. No organomegaly or masses felt. Normal bowel sounds heard. Central nervous system: Alert and oriented. No focal neurological deficits.     Data Reviewed: I have personally reviewed following labs and imaging studies  CBC:  Recent Labs Lab 11/07/16 0107 11/07/16 0912  WBC 11.2* 10.8*  HGB 10.1* 9.8*  HCT 30.6* 30.0*  MCV 84.3 84.7  PLT 424* 4810   Basic Metabolic Panel:  Recent Labs Lab 11/07/16 0107 11/07/16 0912 11/08/16 0556  NA 136  --  137  K 3.4*  --  4.0  CL 100*  --  103  CO2 24  --  25  GLUCOSE 94  --  88  BUN 9  --  <5*  CREATININE 0.80 0.62 0.60  CALCIUM 8.8*  --  8.6*   GFR: CrCl cannot be calculated (Unknown ideal weight.). Liver Function Tests:  Recent Labs Lab 11/07/16 0325 11/08/16 0556  AST 47* 62*  ALT 50 58*  ALKPHOS 122 130*  BILITOT 0.3 0.8  PROT 6.9 6.7  ALBUMIN 3.2* 3.4*    Recent Labs Lab 11/07/16 0107  LIPASE 16   No results for  input(s): AMMONIA in the last 168 hours. Coagulation Profile: No results for input(s): INR, PROTIME in the last 168 hours. Cardiac Enzymes: No results for input(s): CKTOTAL, CKMB, CKMBINDEX, TROPONINI in the last 168 hours. BNP (last 3 results) No results for input(s): PROBNP in the last 8760 hours. HbA1C: No results for input(s): HGBA1C in the last 72 hours. CBG: No results for input(s): GLUCAP in the last 168 hours. Lipid Profile: No results for input(s): CHOL, HDL, LDLCALC, TRIG, CHOLHDL, LDLDIRECT in the last 72 hours. Thyroid Function Tests:  Recent Labs  11/07/16 0912 11/07/16 1141  TSH 8.571*  --   FREET4  --  1.21*   Anemia Panel:  Recent Labs  11/07/16 0926  FERRITIN 9*  TIBC 357  IRON 14*   Urine analysis:    Component Value Date/Time   COLORURINE YELLOW 11/07/2016 0117   APPEARANCEUR HAZY (A) 11/07/2016 0117   LABSPEC 1.023 11/07/2016 0117   PHURINE 5.0 11/07/2016 0117   GLUCOSEU NEGATIVE 11/07/2016 0117   HGBUR SMALL (A) 11/07/2016 0117   BILIRUBINUR NEGATIVE 11/07/2016 0117   KETONESUR 5 (A) 11/07/2016 0117   PROTEINUR NEGATIVE 11/07/2016 0117   UROBILINOGEN 0.2 11/02/2009 2315   NITRITE NEGATIVE 11/07/2016 0117   LEUKOCYTESUR TRACE (A) 11/07/2016 0117    ) Recent Results (from the past 240 hour(s))  Surgical PCR screen     Status: None   Collection Time: 11/07/16  6:37 PM  Result Value Ref Range Status   MRSA, PCR NEGATIVE NEGATIVE Final   Staphylococcus aureus NEGATIVE NEGATIVE Final    Comment:        The Xpert SA Assay (FDA approved for NASAL specimens in patients over 54 years of age), is one component of a comprehensive surveillance program.  Test performance has been validated by [facility] for patients greater than or equal to 12 year old. It is not intended to diagnose infection nor to guide or monitor treatment.       Anti-infectives    None       Radiology Studies: Dg Chest 2 View  Result Date:  11/07/2016 CLINICAL DATA:  Epigastric and right-sided abdominal pain for several days. EXAM: CHEST  2 VIEW COMPARISON:  None. FINDINGS: The heart size and mediastinal contours are within normal limits. Both lungs are clear. The visualized skeletal structures are unremarkable. IMPRESSION: No active cardiopulmonary disease. Electronically Signed   By: Andreas Newport M.D.   On: 11/07/2016 03:36   Ct Angio Chest Pe W And/or Wo Contrast  Result Date: 11/07/2016 CLINICAL DATA:  Chest and right upper quadrant abdominal pain for several days with a pleuritic component. EXAM: CT ANGIOGRAPHY CHEST CT ABDOMEN AND PELVIS WITH CONTRAST TECHNIQUE: Multidetector CT imaging of the chest was performed using the standard protocol during bolus administration of intravenous contrast. Multiplanar CT image reconstructions and MIPs were obtained to evaluate the  vascular anatomy. Multidetector CT imaging of the abdomen and pelvis was performed using the standard protocol during bolus administration of intravenous contrast. CONTRAST:  100 cc Isovue 370 IV. COMPARISON:  None. FINDINGS: CTA CHEST FINDINGS Cardiovascular: The study is high quality for the evaluation of pulmonary embolism. There are no filling defects in the central, lobar, segmental or subsegmental pulmonary artery branches to suggest acute pulmonary embolism. Great vessels are normal in course and caliber. Normal heart size. No significant pericardial fluid/thickening. Mediastinum/Nodes: No discrete thyroid nodules. Unremarkable esophagus. No pathologically enlarged axillary, mediastinal or hilar lymph nodes. Lungs/Pleura: No pneumothorax. No pleural effusion. There are numerous (greater than 15) solid round pulmonary nodules scattered throughout both lungs, which demonstrate an irregular contour with small ground-glass halos, largest 1.6 x 1.3 cm in the right middle lobe (series 4/ image 76) and 1.4 x 1.3 cm in the right lower lobe (series 4/ image 80).  Musculoskeletal:  No aggressive appearing focal osseous lesions. Review of the MIP images confirms the above findings. CT ABDOMEN and PELVIS FINDINGS Hepatobiliary: Normal liver with no liver mass. Cholecystectomy. No biliary ductal dilatation. Pancreas: Normal, with no mass or duct dilation. Spleen: Normal size. No mass. Adrenals/Urinary Tract: Normal adrenals. Normal kidneys with no hydronephrosis and no renal mass. Normal bladder. Stomach/Bowel: Grossly normal stomach. Normal caliber small bowel with no small bowel wall thickening. Normal appendix. Normal large bowel with no diverticulosis, large bowel wall thickening or pericolonic fat stranding. Vascular/Lymphatic: Mildly atherosclerotic nonaneurysmal abdominal aorta. Patent portal, splenic, hepatic and renal veins. No pathologically enlarged lymph nodes in the abdomen or pelvis. Reproductive: Grossly normal retroverted uterus.  No adnexal mass. Other: No pneumoperitoneum, ascites or focal fluid collection. Musculoskeletal: No aggressive appearing focal osseous lesions. Review of the MIP images confirms the above findings. IMPRESSION: 1. No pulmonary embolism. 2. Numerous irregular round solid pulmonary nodules scattered throughout both lungs with small ground-glass halos, largest 1.6 cm in the right middle lobe. An atypical infectious or inflammatory etiology is suspected. Consider fungal pneumonia, septic emboli or pulmonary vasculitis. Metastatic disease is less likely, although not entirely excluded. Short term posttreatment follow-up chest CT advised. 3. No lymphadenopathy in the chest, abdomen or pelvis. 4. No acute abnormality in the abdomen or pelvis. 5. Aortic atherosclerosis. Electronically Signed   By: Ilona Sorrel M.D.   On: 11/07/2016 07:54   Ct Abdomen Pelvis W Contrast  Result Date: 11/07/2016 CLINICAL DATA:  Chest and right upper quadrant abdominal pain for several days with a pleuritic component. EXAM: CT ANGIOGRAPHY CHEST CT ABDOMEN AND  PELVIS WITH CONTRAST TECHNIQUE: Multidetector CT imaging of the chest was performed using the standard protocol during bolus administration of intravenous contrast. Multiplanar CT image reconstructions and MIPs were obtained to evaluate the vascular anatomy. Multidetector CT imaging of the abdomen and pelvis was performed using the standard protocol during bolus administration of intravenous contrast. CONTRAST:  100 cc Isovue 370 IV. COMPARISON:  None. FINDINGS: CTA CHEST FINDINGS Cardiovascular: The study is high quality for the evaluation of pulmonary embolism. There are no filling defects in the central, lobar, segmental or subsegmental pulmonary artery branches to suggest acute pulmonary embolism. Great vessels are normal in course and caliber. Normal heart size. No significant pericardial fluid/thickening. Mediastinum/Nodes: No discrete thyroid nodules. Unremarkable esophagus. No pathologically enlarged axillary, mediastinal or hilar lymph nodes. Lungs/Pleura: No pneumothorax. No pleural effusion. There are numerous (greater than 15) solid round pulmonary nodules scattered throughout both lungs, which demonstrate an irregular contour with small ground-glass halos, largest 1.6 x  1.3 cm in the right middle lobe (series 4/ image 76) and 1.4 x 1.3 cm in the right lower lobe (series 4/ image 80). Musculoskeletal:  No aggressive appearing focal osseous lesions. Review of the MIP images confirms the above findings. CT ABDOMEN and PELVIS FINDINGS Hepatobiliary: Normal liver with no liver mass. Cholecystectomy. No biliary ductal dilatation. Pancreas: Normal, with no mass or duct dilation. Spleen: Normal size. No mass. Adrenals/Urinary Tract: Normal adrenals. Normal kidneys with no hydronephrosis and no renal mass. Normal bladder. Stomach/Bowel: Grossly normal stomach. Normal caliber small bowel with no small bowel wall thickening. Normal appendix. Normal large bowel with no diverticulosis, large bowel wall thickening or  pericolonic fat stranding. Vascular/Lymphatic: Mildly atherosclerotic nonaneurysmal abdominal aorta. Patent portal, splenic, hepatic and renal veins. No pathologically enlarged lymph nodes in the abdomen or pelvis. Reproductive: Grossly normal retroverted uterus.  No adnexal mass. Other: No pneumoperitoneum, ascites or focal fluid collection. Musculoskeletal: No aggressive appearing focal osseous lesions. Review of the MIP images confirms the above findings. IMPRESSION: 1. No pulmonary embolism. 2. Numerous irregular round solid pulmonary nodules scattered throughout both lungs with small ground-glass halos, largest 1.6 cm in the right middle lobe. An atypical infectious or inflammatory etiology is suspected. Consider fungal pneumonia, septic emboli or pulmonary vasculitis. Metastatic disease is less likely, although not entirely excluded. Short term posttreatment follow-up chest CT advised. 3. No lymphadenopathy in the chest, abdomen or pelvis. 4. No acute abnormality in the abdomen or pelvis. 5. Aortic atherosclerosis. Electronically Signed   By: Ilona Sorrel M.D.   On: 11/07/2016 07:54        Scheduled Meds: . heparin  5,000 Units Subcutaneous Q8H  . lisinopril  10 mg Oral Daily   And  . hydrochlorothiazide  12.5 mg Oral Daily  . pantoprazole  40 mg Oral Daily  . sodium chloride flush  3 mL Intravenous Q12H  . tuberculin  5 Units Intradermal Once   Continuous Infusions:   LOS: 0 days    Time spent: 25 min    [place], DO [facility] Pager [phone]  If 7PM-7AM, please contact night-coverage [email or web address] Password [facility] 11/08/2016, 12:27 PM

## 2016-11-08 NOTE — Progress Notes (Signed)
Name: Gabrielle Wilkins MRN: [id number] DOB: 1976-04-03    ADMISSION DATE:  11/07/2016 CONSULTATION DATE:  11/07/16  REFERRING MD :  Dr. Janne Napoleon   CHIEF COMPLAINT:  Chest / epigastric pain   HISTORY OF PRESENT ILLNESS:  41 y/o F admitted on 3/11 with complaints of several days of chest / epigastric pain, worse with inspiration.  She has a history of ulcerative colitis on Entyvio (vedolizumab).  She recently switched from Remicade (induced psoriasis) to [facility].  Her first dose was on Feb 16th and second dose on March 2nd.  She had a negative TB skin test prior to transition to [facility].   The patient reports she has had about 6 weeks of mid-sternal and right chest pain (under her right breast).  She was initially seen by her PCP for chest discomfort and an EKG was assessed which was negative.  Subsequently, she was treated as GERD with omeprazole.  Her prescription ran out and she felt better.  Later on 3/7, the pain returned and was intermittent.  She woke with 3/11 early am with chest pain/discomfort and sought care at the ER.    Initial labs - Na 136, K 3.4, Cl 100, sr cr 0.80, AG 12, troponin 0.0, WBC 10.8, hgb 9.8 and platelets 410.  CTA of the chest, abdomen & pelvis performed which was neg for PE but showed numerous irregular round solid pulmonary nodules bilaterally, largest 1.6 cm in the RML, no LAN in chest/abd/pelvis, no acute abnormality in the abdomen / pelvis.    Of note, she recently flew to [place] to visit her brother after quadruple bypass (he happens to be HIV positive and is compliant with therapy, no TB known).    The patient denies cough, fevers, chills, sputum production, weight loss, decreased appetite and night sweats.   PCCM consulted for evaluation.   SUBJECTIVE: No events overnight, anxious to proceed with bronchoscopy  VITAL SIGNS: Temp:  [98.3 F (36.8 C)-98.5 F (36.9 C)] 98.5 F (36.9 C) (03/12 0616) Pulse Rate:  [69-80] 80 (03/12 0926) Resp:  [18] 18  (03/12 0616) BP: (117-125)/(81-89) 125/82 (03/12 0926) SpO2:  [99 %-100 %] 100 % (03/12 0616)  PHYSICAL EXAMINATION: General: well developed adult female in NAD HEENT: [place]/AT, PERRL, EOM-I and MMM Neuro: Alert and oriented, moving all ext to command CV: RRR, Nl S1/S2, -M/R/G. PULM: CTA bilaterally GI: Soft, NT, ND and +BS Extremities: -edema and -tenderness. Skin: Intact  Recent Labs Lab 11/07/16 0107 11/07/16 0912 11/08/16 0556  NA 136  --  137  K 3.4*  --  4.0  CL 100*  --  103  CO2 24  --  25  BUN 9  --  <5*  CREATININE 0.80 0.62 0.60  GLUCOSE 94  --  88   Recent Labs Lab 11/07/16 0107 11/07/16 0912  HGB 10.1* 9.8*  HCT 30.6* 30.0*  WBC 11.2* 10.8*  PLT 424* 410*   Dg Chest 2 View  Result Date: 11/07/2016 CLINICAL DATA:  Epigastric and right-sided abdominal pain for several days. EXAM: CHEST  2 VIEW COMPARISON:  None. FINDINGS: The heart size and mediastinal contours are within normal limits. Both lungs are clear. The visualized skeletal structures are unremarkable. IMPRESSION: No active cardiopulmonary disease. Electronically Signed   By: Andreas Newport M.D.   On: 11/07/2016 03:36   Ct Angio Chest Pe W And/or Wo Contrast  Result Date: 11/07/2016 CLINICAL DATA:  Chest and right upper quadrant abdominal pain for several days with a pleuritic component.  EXAM: CT ANGIOGRAPHY CHEST CT ABDOMEN AND PELVIS WITH CONTRAST TECHNIQUE: Multidetector CT imaging of the chest was performed using the standard protocol during bolus administration of intravenous contrast. Multiplanar CT image reconstructions and MIPs were obtained to evaluate the vascular anatomy. Multidetector CT imaging of the abdomen and pelvis was performed using the standard protocol during bolus administration of intravenous contrast. CONTRAST:  100 cc Isovue 370 IV. COMPARISON:  None. FINDINGS: CTA CHEST FINDINGS Cardiovascular: The study is high quality for the evaluation of pulmonary embolism. There are no  filling defects in the central, lobar, segmental or subsegmental pulmonary artery branches to suggest acute pulmonary embolism. Great vessels are normal in course and caliber. Normal heart size. No significant pericardial fluid/thickening. Mediastinum/Nodes: No discrete thyroid nodules. Unremarkable esophagus. No pathologically enlarged axillary, mediastinal or hilar lymph nodes. Lungs/Pleura: No pneumothorax. No pleural effusion. There are numerous (greater than 15) solid round pulmonary nodules scattered throughout both lungs, which demonstrate an irregular contour with small ground-glass halos, largest 1.6 x 1.3 cm in the right middle lobe (series 4/ image 76) and 1.4 x 1.3 cm in the right lower lobe (series 4/ image 80). Musculoskeletal:  No aggressive appearing focal osseous lesions. Review of the MIP images confirms the above findings. CT ABDOMEN and PELVIS FINDINGS Hepatobiliary: Normal liver with no liver mass. Cholecystectomy. No biliary ductal dilatation. Pancreas: Normal, with no mass or duct dilation. Spleen: Normal size. No mass. Adrenals/Urinary Tract: Normal adrenals. Normal kidneys with no hydronephrosis and no renal mass. Normal bladder. Stomach/Bowel: Grossly normal stomach. Normal caliber small bowel with no small bowel wall thickening. Normal appendix. Normal large bowel with no diverticulosis, large bowel wall thickening or pericolonic fat stranding. Vascular/Lymphatic: Mildly atherosclerotic nonaneurysmal abdominal aorta. Patent portal, splenic, hepatic and renal veins. No pathologically enlarged lymph nodes in the abdomen or pelvis. Reproductive: Grossly normal retroverted uterus.  No adnexal mass. Other: No pneumoperitoneum, ascites or focal fluid collection. Musculoskeletal: No aggressive appearing focal osseous lesions. Review of the MIP images confirms the above findings. IMPRESSION: 1. No pulmonary embolism. 2. Numerous irregular round solid pulmonary nodules scattered throughout both  lungs with small ground-glass halos, largest 1.6 cm in the right middle lobe. An atypical infectious or inflammatory etiology is suspected. Consider fungal pneumonia, septic emboli or pulmonary vasculitis. Metastatic disease is less likely, although not entirely excluded. Short term posttreatment follow-up chest CT advised. 3. No lymphadenopathy in the chest, abdomen or pelvis. 4. No acute abnormality in the abdomen or pelvis. 5. Aortic atherosclerosis. Electronically Signed   By: Ilona Sorrel M.D.   On: 11/07/2016 07:54   Ct Abdomen Pelvis W Contrast  Result Date: 11/07/2016 CLINICAL DATA:  Chest and right upper quadrant abdominal pain for several days with a pleuritic component. EXAM: CT ANGIOGRAPHY CHEST CT ABDOMEN AND PELVIS WITH CONTRAST TECHNIQUE: Multidetector CT imaging of the chest was performed using the standard protocol during bolus administration of intravenous contrast. Multiplanar CT image reconstructions and MIPs were obtained to evaluate the vascular anatomy. Multidetector CT imaging of the abdomen and pelvis was performed using the standard protocol during bolus administration of intravenous contrast. CONTRAST:  100 cc Isovue 370 IV. COMPARISON:  None. FINDINGS: CTA CHEST FINDINGS Cardiovascular: The study is high quality for the evaluation of pulmonary embolism. There are no filling defects in the central, lobar, segmental or subsegmental pulmonary artery branches to suggest acute pulmonary embolism. Great vessels are normal in course and caliber. Normal heart size. No significant pericardial fluid/thickening. Mediastinum/Nodes: No discrete thyroid nodules. Unremarkable esophagus. No  pathologically enlarged axillary, mediastinal or hilar lymph nodes. Lungs/Pleura: No pneumothorax. No pleural effusion. There are numerous (greater than 15) solid round pulmonary nodules scattered throughout both lungs, which demonstrate an irregular contour with small ground-glass halos, largest 1.6 x 1.3 cm in  the right middle lobe (series 4/ image 76) and 1.4 x 1.3 cm in the right lower lobe (series 4/ image 80). Musculoskeletal:  No aggressive appearing focal osseous lesions. Review of the MIP images confirms the above findings. CT ABDOMEN and PELVIS FINDINGS Hepatobiliary: Normal liver with no liver mass. Cholecystectomy. No biliary ductal dilatation. Pancreas: Normal, with no mass or duct dilation. Spleen: Normal size. No mass. Adrenals/Urinary Tract: Normal adrenals. Normal kidneys with no hydronephrosis and no renal mass. Normal bladder. Stomach/Bowel: Grossly normal stomach. Normal caliber small bowel with no small bowel wall thickening. Normal appendix. Normal large bowel with no diverticulosis, large bowel wall thickening or pericolonic fat stranding. Vascular/Lymphatic: Mildly atherosclerotic nonaneurysmal abdominal aorta. Patent portal, splenic, hepatic and renal veins. No pathologically enlarged lymph nodes in the abdomen or pelvis. Reproductive: Grossly normal retroverted uterus.  No adnexal mass. Other: No pneumoperitoneum, ascites or focal fluid collection. Musculoskeletal: No aggressive appearing focal osseous lesions. Review of the MIP images confirms the above findings. IMPRESSION: 1. No pulmonary embolism. 2. Numerous irregular round solid pulmonary nodules scattered throughout both lungs with small ground-glass halos, largest 1.6 cm in the right middle lobe. An atypical infectious or inflammatory etiology is suspected. Consider fungal pneumonia, septic emboli or pulmonary vasculitis. Metastatic disease is less likely, although not entirely excluded. Short term posttreatment follow-up chest CT advised. 3. No lymphadenopathy in the chest, abdomen or pelvis. 4. No acute abnormality in the abdomen or pelvis. 5. Aortic atherosclerosis. Electronically Signed   By: Ilona Sorrel M.D.   On: 11/07/2016 07:54   SIGNIFICANT EVENTS  3/11 Admit with chest / epigastric pain.  CT with multiple irregular solid  pulmonary nodules  STUDIES:  CTA Chest, ABD/Pelvis 3/11 >> neg for PE, numerous irregular round solid pulmonary nodules bilaterally, largest 1.6 cm in the RML, no LAN in chest/abd/pelvis, no acute abnormality in the abdomen / pelvis ESR 3/11 >> 47  I reviewed CT of the chest myself, pulmonary nodules noted but none that I can see reaching bronchoscopically.  ASSESSMENT / PLAN:  41 y/o F on immune suppression for ulcerative colitis with recent change in therapy who presented with chest pain.  CT with bilateral pulmonary nodules, PE ruled out.  DDx includes bacterial / fungal etiologies, autoimmune, less likely malignant but can not rule out without tissue sampling.  No LAN on CT.  Discussed with PCCM-NP.  Bilateral Pulmonary Nodules   - Bronch today at 2 PM  - NPO now for BAL later today  ?TB on immune modulators   - Continue airborne precautions  - Assess Quantiferon Gold   - BAL for AFB  Hypoxemia: resolved  - Titrate O2 to off.  PCCM will f/u.  Rush Farmer, M.D. [facility] Pulmonary/Critical Care Medicine. Pager: [phone]. After hours pager: [phone].  11/08/2016, 12:46 PM

## 2016-11-08 NOTE — Procedures (Signed)
Bronchoscopy Procedure Note Gabrielle Wilkins [id number] 1975/10/17  Procedure: Bronchoscopy Indications: Obtain specimens for culture and/or other diagnostic studies  Procedure Details Consent: Risks of procedure as well as the alternatives and risks of each were explained to the (patient/caregiver).  Consent for procedure obtained. Time Out: Verified patient identification, verified procedure, site/side was marked, verified correct patient position, special equipment/implants available, medications/allergies/relevent history reviewed, required imaging and test results available.  Performed  In preparation for procedure, patient was given 100% FiO2 and bronchoscope lubricated. Sedation: Benzodiazepines and Fentanyl  Airway entered and the following bronchi were examined: RUL, RML, RLL, LUL, LLL and Bronchi.   BAL from RML and RLL drawn into different containers. No endobronchial lesions noted. Bronchoscope removed.  , Patient placed back on 100% FiO2 at conclusion of procedure.    Evaluation Hemodynamic Status: BP stable throughout; O2 sats: stable throughout Patient's Current Condition: stable Specimens:  Sent serosanguinous fluid Complications: No apparent complications Patient did tolerate procedure well.   Jennet Maduro 11/08/2016

## 2016-11-08 NOTE — Progress Notes (Signed)
Pt NPO, has headache, pt states she only had a little clear liquids yesterday. Pt BP this am 125/82 (117/81 early am) pt due to get lisinopril and HCTZ this am. Pt felt her headache may be due to some dehydration due to little po intake. Pt NPO for bronch today, will give these meds today after bronch and pt taking po's today at pt request.

## 2016-11-08 NOTE — Progress Notes (Signed)
Video bronchoscopy performed.  Intervention bronchial washing.  No complications noted.  Will continue to monitor.  Rush Farmer, M.D. [facility] Pulmonary/Critical Care Medicine. Pager: [phone]. After hours pager: [phone].

## 2016-11-09 DIAGNOSIS — K219 Gastro-esophageal reflux disease without esophagitis: Secondary | ICD-10-CM | POA: Diagnosis present

## 2016-11-09 DIAGNOSIS — D849 Immunodeficiency, unspecified: Secondary | ICD-10-CM | POA: Diagnosis not present

## 2016-11-09 DIAGNOSIS — T502X5A Adverse effect of carbonic-anhydrase inhibitors, benzothiadiazides and other diuretics, initial encounter: Secondary | ICD-10-CM | POA: Diagnosis present

## 2016-11-09 DIAGNOSIS — I10 Essential (primary) hypertension: Secondary | ICD-10-CM | POA: Diagnosis present

## 2016-11-09 DIAGNOSIS — E876 Hypokalemia: Secondary | ICD-10-CM | POA: Diagnosis present

## 2016-11-09 DIAGNOSIS — Z79899 Other long term (current) drug therapy: Secondary | ICD-10-CM | POA: Diagnosis not present

## 2016-11-09 DIAGNOSIS — K5120 Ulcerative (chronic) proctitis without complications: Secondary | ICD-10-CM | POA: Diagnosis present

## 2016-11-09 DIAGNOSIS — R918 Other nonspecific abnormal finding of lung field: Secondary | ICD-10-CM | POA: Diagnosis not present

## 2016-11-09 DIAGNOSIS — R0902 Hypoxemia: Secondary | ICD-10-CM | POA: Diagnosis present

## 2016-11-09 DIAGNOSIS — Z9049 Acquired absence of other specified parts of digestive tract: Secondary | ICD-10-CM | POA: Diagnosis not present

## 2016-11-09 DIAGNOSIS — R0781 Pleurodynia: Secondary | ICD-10-CM | POA: Diagnosis present

## 2016-11-09 DIAGNOSIS — D899 Disorder involving the immune mechanism, unspecified: Secondary | ICD-10-CM | POA: Diagnosis present

## 2016-11-09 DIAGNOSIS — R042 Hemoptysis: Secondary | ICD-10-CM | POA: Diagnosis not present

## 2016-11-09 DIAGNOSIS — D649 Anemia, unspecified: Secondary | ICD-10-CM | POA: Diagnosis present

## 2016-11-09 DIAGNOSIS — R946 Abnormal results of thyroid function studies: Secondary | ICD-10-CM | POA: Diagnosis present

## 2016-11-09 DIAGNOSIS — R7989 Other specified abnormal findings of blood chemistry: Secondary | ICD-10-CM | POA: Diagnosis not present

## 2016-11-09 LAB — CBC
HCT: 31 % — ABNORMAL LOW (ref 36.0–46.0)
HEMOGLOBIN: 10.2 g/dL — AB (ref 12.0–15.0)
MCH: 28.2 pg (ref 26.0–34.0)
MCHC: 32.9 g/dL (ref 30.0–36.0)
MCV: 85.6 fL (ref 78.0–100.0)
PLATELETS: 392 10*3/uL (ref 150–400)
RBC: 3.62 MIL/uL — AB (ref 3.87–5.11)
RDW: 13.7 % (ref 11.5–15.5)
WBC: 10.9 10*3/uL — ABNORMAL HIGH (ref 4.0–10.5)

## 2016-11-09 LAB — BASIC METABOLIC PANEL
ANION GAP: 7 (ref 5–15)
CO2: 26 mmol/L (ref 22–32)
Calcium: 8.7 mg/dL — ABNORMAL LOW (ref 8.9–10.3)
Chloride: 103 mmol/L (ref 101–111)
Creatinine, Ser: 0.52 mg/dL (ref 0.44–1.00)
Glucose, Bld: 94 mg/dL (ref 65–99)
POTASSIUM: 4.1 mmol/L (ref 3.5–5.1)
SODIUM: 136 mmol/L (ref 135–145)

## 2016-11-09 LAB — PNEUMOCYSTIS JIROVECI SMEAR BY DFA
PNEUMOCYSTIS JIROVECI AG: NEGATIVE
Pneumocystis jiroveci Ag: NEGATIVE

## 2016-11-09 LAB — ACID FAST SMEAR (AFB, MYCOBACTERIA): Acid Fast Smear: NEGATIVE

## 2016-11-09 LAB — ACID FAST SMEAR (AFB): ACID FAST SMEAR - AFSCU2: NEGATIVE

## 2016-11-09 MED ORDER — HYDROCORTISONE 1 % EX CREA
TOPICAL_CREAM | Freq: Three times a day (TID) | CUTANEOUS | Status: DC
  Administered 2016-11-09 – 2016-11-10 (×2): via TOPICAL
  Filled 2016-11-09: qty 28

## 2016-11-09 MED ORDER — TRAMADOL HCL 50 MG PO TABS
50.0000 mg | ORAL_TABLET | Freq: Two times a day (BID) | ORAL | Status: DC | PRN
  Administered 2016-11-09 – 2016-11-10 (×2): 50 mg via ORAL
  Filled 2016-11-09 (×2): qty 1

## 2016-11-09 NOTE — Progress Notes (Signed)
PROGRESS NOTE    Gabrielle Wilkins  IRS:[id number] DOB: 1976/07/07 DOA: 11/07/2016 PCP: Gerrit Heck, MD   Outpatient Specialists:     Brief Narrative:  Gabrielle Wilkins is a 41 y.o. female with significant past medical history of HTN, ulcerative colitis, has been started on Entyvio for UC for the past 3-4 week, has also been on Remicade in past, now presents to the ED secondary to severe midepigastric/pleuritic chest pain that woke her up last night around 11:00pm.  Pt states about 6 weeks ago she had milder pain in the mid epigastric region and her primary care doctor started her on omeprazole.  She finished that about 2 weeks ago, however the MEG pains restarted this past week.  She called her GI doctor,  who started her on omeprazole again this last week. She denies any nausea or vomiting.  Denies any fevers or chills, cough, proximal nocturnal dyspnea. Denies any diarrhea. Of note, she said she had a TB skin test about a month ago and it was negative prior to starting Entyvio.  No sick contacts, she was in [place] last month.    Assessment & Plan:   Principal Problem:   Pulmonary nodules Active Problems:   Chronic ulcerative proctitis (HCC)   Immunocompromised state (HCC)   Pleuritic chest pain   Hypokalemia   Anemia   Pulmonary nodules  - on CT; Numerous irregular round solid pulmonary nodules scattered throughout bilateral lungs with small groundglass appearance, largest 1.6 cm in the right middle lobe. - An atypical infectious or inflammatory process is suspected, per radiology reading - appreciate pulm consult-- s/p bronch-- cultures pending -r/o TB- airborne precautions for now.  immunecompromised state, - has been on Remicade prior for UC, currently on Entyvio -negative TB test 2 weeks ago  Htn, controlled - continue prinzide 10-12.5qd  Hypokalemia - replaced and resolved  Anemia, normocytic - PO iron replacement  gerd Continue ppi  Elevated TSH  and free T4 -outpatient follow up  Elevated LFTs -recheck in AM   DVT prophylaxis:  SQ Heparin  Code Status: Full Code   Family Communication: patient  Disposition Plan:     Consultants:  pulm  Subjective: Pain seems to be the most in the AM when waking up and tapers through out the day  Objective: Vitals:   11/08/16 2133 11/09/16 0533 11/09/16 0750 11/09/16 1049  BP: 122/71 110/65 108/69 122/83  Pulse: 85 75 81 72  Resp: 16 16    Temp: 98.5 F (36.9 C) 99.8 F (37.7 C)    TempSrc: Oral Oral    SpO2: 100% 97% 99%   Weight:      Height:        Intake/Output Summary (Last 24 hours) at 11/09/16 1229 Last data filed at 11/09/16 1051  Gross per 24 hour  Intake                3 ml  Output                0 ml  Net                3 ml   Filed Weights   11/08/16 1310  Weight: 65.8 kg (145 lb)    Examination:  General exam: Appears calm and comfortable  Respiratory system: diminished but no wheezing Cardiovascular system: S1 & S2 heard, RRR. No JVD, murmurs, rubs, gallops or clicks. No pedal edema. Gastrointestinal system: Abdomen is nondistended, soft and nontender. No organomegaly or masses  felt. Normal bowel sounds heard. Central nervous system: Alert and oriented. No focal neurological deficits.     Data Reviewed: I have personally reviewed following labs and imaging studies  CBC:  Recent Labs Lab 11/07/16 0107 11/07/16 0912 11/09/16 0748  WBC 11.2* 10.8* 10.9*  HGB 10.1* 9.8* 10.2*  HCT 30.6* 30.0* 31.0*  MCV 84.3 84.7 85.6  PLT 424* 410* 540   Basic Metabolic Panel:  Recent Labs Lab 11/07/16 0107 11/07/16 0912 11/08/16 0556 11/09/16 0748  NA 136  --  137 136  K 3.4*  --  4.0 4.1  CL 100*  --  103 103  CO2 24  --  25 26  GLUCOSE 94  --  88 94  BUN 9  --  <5* <5*  CREATININE 0.80 0.62 0.60 0.52  CALCIUM 8.8*  --  8.6* 8.7*   GFR: Estimated Creatinine Clearance: 84.9 mL/min (by C-G formula based on SCr of 0.52 mg/dL). Liver  Function Tests:  Recent Labs Lab 11/07/16 0325 11/08/16 0556  AST 47* 62*  ALT 50 58*  ALKPHOS 122 130*  BILITOT 0.3 0.8  PROT 6.9 6.7  ALBUMIN 3.2* 3.4*    Recent Labs Lab 11/07/16 0107  LIPASE 16   No results for input(s): AMMONIA in the last 168 hours. Coagulation Profile: No results for input(s): INR, PROTIME in the last 168 hours. Cardiac Enzymes: No results for input(s): CKTOTAL, CKMB, CKMBINDEX, TROPONINI in the last 168 hours. BNP (last 3 results) No results for input(s): PROBNP in the last 8760 hours. HbA1C: No results for input(s): HGBA1C in the last 72 hours. CBG: No results for input(s): GLUCAP in the last 168 hours. Lipid Profile: No results for input(s): CHOL, HDL, LDLCALC, TRIG, CHOLHDL, LDLDIRECT in the last 72 hours. Thyroid Function Tests:  Recent Labs  11/07/16 0912 11/07/16 1141  TSH 8.571*  --   FREET4  --  1.21*   Anemia Panel:  Recent Labs  11/07/16 0926  FERRITIN 9*  TIBC 357  IRON 14*   Urine analysis:    Component Value Date/Time   COLORURINE YELLOW 11/07/2016 0117   APPEARANCEUR HAZY (A) 11/07/2016 0117   LABSPEC 1.023 11/07/2016 0117   PHURINE 5.0 11/07/2016 0117   GLUCOSEU NEGATIVE 11/07/2016 0117   HGBUR SMALL (A) 11/07/2016 0117   BILIRUBINUR NEGATIVE 11/07/2016 0117   KETONESUR 5 (A) 11/07/2016 0117   PROTEINUR NEGATIVE 11/07/2016 0117   UROBILINOGEN 0.2 11/02/2009 2315   NITRITE NEGATIVE 11/07/2016 0117   LEUKOCYTESUR TRACE (A) 11/07/2016 0117    ) Recent Results (from the past 240 hour(s))  Culture, blood (routine x 2)     Status: None (Preliminary result)   Collection Time: 11/07/16  9:19 AM  Result Value Ref Range Status   Specimen Description BLOOD RIGHT FOREARM  Final   Special Requests BOTTLES DRAWN AEROBIC AND ANAEROBIC 5CC  Final   Culture NO GROWTH 1 DAY  Final   Report Status PENDING  Incomplete  Fungus culture, blood     Status: None (Preliminary result)   Collection Time: 11/07/16  9:19 AM    Result Value Ref Range Status   Specimen Description BLOOD RIGHT FOREARM  Final   Special Requests BOTTLES DRAWN AEROBIC AND ANAEROBIC 5CC  Final   Culture NO GROWTH 1 DAY  Final   Report Status PENDING  Incomplete  Culture, blood (routine x 2)     Status: None (Preliminary result)   Collection Time: 11/07/16  9:28 AM  Result Value Ref Range Status  Specimen Description BLOOD RIGHT ANTECUBITAL  Final   Special Requests BOTTLES DRAWN AEROBIC AND ANAEROBIC 5CC  Final   Culture NO GROWTH 1 DAY  Final   Report Status PENDING  Incomplete  Surgical PCR screen     Status: None   Collection Time: 11/07/16  6:37 PM  Result Value Ref Range Status   MRSA, PCR NEGATIVE NEGATIVE Final   Staphylococcus aureus NEGATIVE NEGATIVE Final    Comment:        The Xpert SA Assay (FDA approved for NASAL specimens in patients over 77 years of age), is one component of a comprehensive surveillance program.  Test performance has been validated by [facility] for patients greater than or equal to 65 year old. It is not intended to diagnose infection nor to guide or monitor treatment.   Culture, bal-quantitative     Status: None (Preliminary result)   Collection Time: 11/08/16  2:12 PM  Result Value Ref Range Status   Specimen Description BRONCHIAL ALVEOLAR LAVAGE RIGHT  Final   Special Requests  RIGHT LOBE  Final   Gram Stain   Final    MODERATE WBC PRESENT,BOTH PMN AND MONONUCLEAR NO ORGANISMS SEEN    Culture PENDING  Incomplete   Report Status PENDING  Incomplete  Pneumocystis smear by DFA     Status: None   Collection Time: 11/08/16  2:12 PM  Result Value Ref Range Status   Specimen Source-[facility] BRONCHIAL ALVEOLAR LAVAGE  Final    Comment: RIGHT LOBE   Pneumocystis jiroveci Ag NEGATIVE  Final    Comment: Performed at [facility]  Culture, bal-quantitative     Status: None (Preliminary result)   Collection Time: 11/08/16  2:12 PM  Result Value Ref Range Status   Specimen  Description BRONCHIAL ALVEOLAR LAVAGE RIGHT  Final   Special Requests RIGHT MIDDLE LOBE  Final   Gram Stain   Final    ABUNDANT WBC PRESENT,BOTH PMN AND MONONUCLEAR NO ORGANISMS SEEN    Culture PENDING  Incomplete   Report Status PENDING  Incomplete  Pneumocystis smear by DFA     Status: None   Collection Time: 11/08/16  2:12 PM  Result Value Ref Range Status   Specimen Source-[facility] BRONCHIAL ALVEOLAR LAVAGE  Final    Comment:  RIGHT MIDDLE LOBE   Pneumocystis jiroveci Ag NEGATIVE  Final    Comment: Performed at [place]    None       Radiology Studies: No results found.      Scheduled Meds: . heparin  5,000 Units Subcutaneous Q8H  . lisinopril  10 mg Oral Daily   And  . hydrochlorothiazide  12.5 mg Oral Daily  . hydrocortisone cream   Topical TID  . pantoprazole  40 mg Oral Daily  . sodium chloride flush  3 mL Intravenous Q12H  . tuberculin  5 Units Intradermal Once   Continuous Infusions:   LOS: 0 days    Time spent: 25 min    [place], DO [facility] Pager [phone]  If 7PM-7AM, please contact night-coverage [email or web address] Password [facility] 11/09/2016, 12:29 PM

## 2016-11-09 NOTE — Progress Notes (Signed)
Chart reviewed, all cultures pending from [facility], quantiferon gold pending.  Pt not able to be discharged until TB ruled out.  PCCM will follow up again in am for review and discuss findings with patient once cultures returned.     Plan: Await quantiferon gold Follow cultures, cytology  Supportive care Airborne precautions for now   Noe Gens, NP-C [facility] Pgr: [phone] or if no answer [phone] 11/09/2016, 1:27 PM

## 2016-11-10 ENCOUNTER — Telehealth: Payer: Self-pay | Admitting: Pulmonary Disease

## 2016-11-10 DIAGNOSIS — R042 Hemoptysis: Secondary | ICD-10-CM

## 2016-11-10 DIAGNOSIS — D849 Immunodeficiency, unspecified: Secondary | ICD-10-CM

## 2016-11-10 LAB — QUANTIFERON IN TUBE
QFT TB AG MINUS NIL VALUE: 0 [IU]/mL
QUANTIFERON MITOGEN VALUE: 7.14 [IU]/mL
QUANTIFERON TB AG VALUE: 0.03 IU/mL
QUANTIFERON TB GOLD: NEGATIVE
Quantiferon Nil Value: 0.03 IU/mL

## 2016-11-10 LAB — COMPREHENSIVE METABOLIC PANEL
ALBUMIN: 3.4 g/dL — AB (ref 3.5–5.0)
ALK PHOS: 139 U/L — AB (ref 38–126)
ALT: 59 U/L — ABNORMAL HIGH (ref 14–54)
ANION GAP: 12 (ref 5–15)
AST: 44 U/L — ABNORMAL HIGH (ref 15–41)
BUN: 5 mg/dL — AB (ref 6–20)
CALCIUM: 8.9 mg/dL (ref 8.9–10.3)
CO2: 26 mmol/L (ref 22–32)
CREATININE: 0.71 mg/dL (ref 0.44–1.00)
Chloride: 100 mmol/L — ABNORMAL LOW (ref 101–111)
GFR calc Af Amer: >60 mL/min (ref >60)
GFR calc non Af Amer: >60 mL/min (ref >60)
GLUCOSE: 93 mg/dL (ref 65–99)
POTASSIUM: 4 mmol/L (ref 3.5–5.1)
SODIUM: 138 mmol/L (ref 135–145)
TOTAL PROTEIN: 6.9 g/dL (ref 6.5–8.1)
Total Bilirubin: 0.3 mg/dL (ref 0.3–1.2)

## 2016-11-10 LAB — CBC
HCT: 32.5 % — ABNORMAL LOW (ref 36.0–46.0)
Hemoglobin: 10.6 g/dL — ABNORMAL LOW (ref 12.0–15.0)
MCH: 27.9 pg (ref 26.0–34.0)
MCHC: 32.6 g/dL (ref 30.0–36.0)
MCV: 85.5 fL (ref 78.0–100.0)
PLATELETS: 452 10*3/uL — AB (ref 150–400)
RBC: 3.8 MIL/uL — AB (ref 3.87–5.11)
RDW: 13.8 % (ref 11.5–15.5)
WBC: 10.1 10*3/uL (ref 4.0–10.5)

## 2016-11-10 LAB — EXPECTORATED SPUTUM ASSESSMENT W REFEX TO RESP CULTURE

## 2016-11-10 LAB — QUANTIFERON TB GOLD ASSAY (BLOOD)

## 2016-11-10 LAB — EXPECTORATED SPUTUM ASSESSMENT W GRAM STAIN, RFLX TO RESP C

## 2016-11-10 MED ORDER — HYDROCODONE-ACETAMINOPHEN 5-325 MG PO TABS: 1.0000 | ORAL_TABLET | ORAL | 0 refills | Status: DC | PRN

## 2016-11-10 MED ORDER — TRAMADOL HCL 50 MG PO TABS: 50.0000 mg | ORAL_TABLET | Freq: Four times a day (QID) | ORAL | 0 refills | Status: DC | PRN

## 2016-11-10 NOTE — Discharge Summary (Addendum)
Physician Discharge Summary  Gabrielle Wilkins GGY:[id number] DOB: 11/10/1975 DOA: 11/07/2016  PCP: Gerrit Heck, MD  Admit date: 11/07/2016 Discharge date: 11/10/2016   Recommendations for Outpatient Follow-Up:   1. Fungal culture pending 2. Urine histoplasma in process 3. outpatient thyroid panel and LFTs 4. 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  Diet recommendation: Low sodium, heart healthy  Wound care: None.   History of Present Illness:   Gabrielle Wilkins a 41 y.o.femalewith significant past medical history of HTN, ulcerative colitis, has been started on Entyvio for UC for the past 3-4 week, has also been on Remicade in past, now presents to the ED secondary to severe midepigastric/pleuritic chest pain that woke her up last night around 11:00pm. Pt states about 6 weeks ago she had milder pain in the mid epigastric region and her primary care doctor started her on omeprazole. She finished that about 2 weeks ago, however the MEG pains restarted this past week. She calledher GI doctor, who started her on omeprazole again thislast week. She denies any nausea or vomiting. Denies any fevers or chills, cough, proximal nocturnal dyspnea. Denies any diarrhea. Of note, she said she had a TB skin test about a month ago and it was negative prior to starting Entyvio. No sick contacts, she was in [place] last month.    Hospital Course by Problem:   Pulmonary nodules  - on CT; Numerous irregular round solid pulmonary nodules scattered throughout bilateral lungs with small groundglass appearance, largest 1.6 cm in the right middle lobe. - An atypical infectious or inflammatory process is suspected, per  radiology reading - appreciate pulm consult-- s/p bronch-- AFB negative so TB ruled out Path negative for malignacy -? Histoplasmosis or aspergillus? - recently in [place] -spoke with Dr. Nelda Marseille-- no need for abx currently and ok to d/c  immunocompromised state, - has been on Remicade prior for UC, currently on Entyvio -ok per pulm to resume treatment  Htn, controlled - continue prinzide 10-12.5qd  Hypokalemia - replaced and resolved  Anemia, normocytic - PO iron replacement  gerd Continue ppi  Elevated TSH and free T4 -outpatient follow up  Elevated LFTs -recheck in AM    Medical Consultants:    Pulm   Discharge Exam:   Vitals:   11/10/16 0453 11/10/16 0829  BP: 101/65 128/85  Pulse: (!) 57 74  Resp: 18   Temp: 98 F (36.7 C)    Vitals:   11/09/16 1706 11/09/16 2018 11/10/16 0453 11/10/16 0829  BP: 108/74 103/81 101/65 128/85  Pulse: 81 78 (!) 57 74  Resp: _0 Temp: 98.6 F (37 C) 98.8 F (37.1 C) 98 F (36.7 C)   TempSrc: Oral Oral Oral   SpO2: 100% 100% 99% 100%  Weight:      Height:        Gen:  NAD    The results of significant diagnostics from this hospitalization (including imaging, microbiology, ancillary and laboratory) are listed below for reference.     Procedures and Diagnostic Studies:   Dg Chest 2 View  Result Date: 11/07/2016 CLINICAL DATA:  Epigastric and right-sided abdominal pain for several days. EXAM: CHEST  2 VIEW COMPARISON:  None. FINDINGS: The heart  size and mediastinal contours are within normal limits. Both lungs are clear. The visualized skeletal structures are unremarkable. IMPRESSION: No active cardiopulmonary disease. Electronically Signed   By: Andreas Newport M.D.   On: 11/07/2016 03:36   Ct Angio Chest Pe W And/or Wo Contrast  Result Date: 11/07/2016 CLINICAL DATA:  Chest and right upper quadrant abdominal pain for several days with a pleuritic component. EXAM: CT ANGIOGRAPHY CHEST CT  ABDOMEN AND PELVIS WITH CONTRAST TECHNIQUE: Multidetector CT imaging of the chest was performed using the standard protocol during bolus administration of intravenous contrast. Multiplanar CT image reconstructions and MIPs were obtained to evaluate the vascular anatomy. Multidetector CT imaging of the abdomen and pelvis was performed using the standard protocol during bolus administration of intravenous contrast. CONTRAST:  100 cc Isovue 370 IV. COMPARISON:  None. FINDINGS: CTA CHEST FINDINGS Cardiovascular: The study is high quality for the evaluation of pulmonary embolism. There are no filling defects in the central, lobar, segmental or subsegmental pulmonary artery branches to suggest acute pulmonary embolism. Great vessels are normal in course and caliber. Normal heart size. No significant pericardial fluid/thickening. Mediastinum/Nodes: No discrete thyroid nodules. Unremarkable esophagus. No pathologically enlarged axillary, mediastinal or hilar lymph nodes. Lungs/Pleura: No pneumothorax. No pleural effusion. There are numerous (greater than 15) solid round pulmonary nodules scattered throughout both lungs, which demonstrate an irregular contour with small ground-glass halos, largest 1.6 x 1.3 cm in the right middle lobe (series 4/ image 76) and 1.4 x 1.3 cm in the right lower lobe (series 4/ image 80). Musculoskeletal:  No aggressive appearing focal osseous lesions. Review of the MIP images confirms the above findings. CT ABDOMEN and PELVIS FINDINGS Hepatobiliary: Normal liver with no liver mass. Cholecystectomy. No biliary ductal dilatation. Pancreas: Normal, with no mass or duct dilation. Spleen: Normal size. No mass. Adrenals/Urinary Tract: Normal adrenals. Normal kidneys with no hydronephrosis and no renal mass. Normal bladder. Stomach/Bowel: Grossly normal stomach. Normal caliber small bowel with no small bowel wall thickening. Normal appendix. Normal large bowel with no diverticulosis, large bowel wall  thickening or pericolonic fat stranding. Vascular/Lymphatic: Mildly atherosclerotic nonaneurysmal abdominal aorta. Patent portal, splenic, hepatic and renal veins. No pathologically enlarged lymph nodes in the abdomen or pelvis. Reproductive: Grossly normal retroverted uterus.  No adnexal mass. Other: No pneumoperitoneum, ascites or focal fluid collection. Musculoskeletal: No aggressive appearing focal osseous lesions. Review of the MIP images confirms the above findings. IMPRESSION: 1. No pulmonary embolism. 2. Numerous irregular round solid pulmonary nodules scattered throughout both lungs with small ground-glass halos, largest 1.6 cm in the right middle lobe. An atypical infectious or inflammatory etiology is suspected. Consider fungal pneumonia, septic emboli or pulmonary vasculitis. Metastatic disease is less likely, although not entirely excluded. Short term posttreatment follow-up chest CT advised. 3. No lymphadenopathy in the chest, abdomen or pelvis. 4. No acute abnormality in the abdomen or pelvis. 5. Aortic atherosclerosis. Electronically Signed   By: Ilona Sorrel M.D.   On: 11/07/2016 07:54   Ct Abdomen Pelvis W Contrast  Result Date: 11/07/2016 CLINICAL DATA:  Chest and right upper quadrant abdominal pain for several days with a pleuritic component. EXAM: CT ANGIOGRAPHY CHEST CT ABDOMEN AND PELVIS WITH CONTRAST TECHNIQUE: Multidetector CT imaging of the chest was performed using the standard protocol during bolus administration of intravenous contrast. Multiplanar CT image reconstructions and MIPs were obtained to evaluate the vascular anatomy. Multidetector CT imaging of the abdomen and pelvis was performed using the standard protocol during bolus administration of intravenous contrast. CONTRAST:  100 cc Isovue 370 IV. COMPARISON:  None. FINDINGS: CTA CHEST FINDINGS Cardiovascular: The study is high quality for the evaluation of pulmonary embolism. There are no filling defects in the central,  lobar, segmental or subsegmental pulmonary artery branches to suggest acute pulmonary embolism. Great vessels are normal in course and caliber. Normal heart size. No significant pericardial fluid/thickening. Mediastinum/Nodes: No discrete thyroid nodules. Unremarkable esophagus. No pathologically enlarged axillary, mediastinal or hilar lymph nodes. Lungs/Pleura: No pneumothorax. No pleural effusion. There are numerous (greater than 15) solid round pulmonary nodules scattered throughout both lungs, which demonstrate an irregular contour with small ground-glass halos, largest 1.6 x 1.3 cm in the right middle lobe (series 4/ image 76) and 1.4 x 1.3 cm in the right lower lobe (series 4/ image 80). Musculoskeletal:  No aggressive appearing focal osseous lesions. Review of the MIP images confirms the above findings. CT ABDOMEN and PELVIS FINDINGS Hepatobiliary: Normal liver with no liver mass. Cholecystectomy. No biliary ductal dilatation. Pancreas: Normal, with no mass or duct dilation. Spleen: Normal size. No mass. Adrenals/Urinary Tract: Normal adrenals. Normal kidneys with no hydronephrosis and no renal mass. Normal bladder. Stomach/Bowel: Grossly normal stomach. Normal caliber small bowel with no small bowel wall thickening. Normal appendix. Normal large bowel with no diverticulosis, large bowel wall thickening or pericolonic fat stranding. Vascular/Lymphatic: Mildly atherosclerotic nonaneurysmal abdominal aorta. Patent portal, splenic, hepatic and renal veins. No pathologically enlarged lymph nodes in the abdomen or pelvis. Reproductive: Grossly normal retroverted uterus.  No adnexal mass. Other: No pneumoperitoneum, ascites or focal fluid collection. Musculoskeletal: No aggressive appearing focal osseous lesions. Review of the MIP images confirms the above findings. IMPRESSION: 1. No pulmonary embolism. 2. Numerous irregular round solid pulmonary nodules scattered throughout both lungs with small ground-glass  halos, largest 1.6 cm in the right middle lobe. An atypical infectious or inflammatory etiology is suspected. Consider fungal pneumonia, septic emboli or pulmonary vasculitis. Metastatic disease is less likely, although not entirely excluded. Short term posttreatment follow-up chest CT advised. 3. No lymphadenopathy in the chest, abdomen or pelvis. 4. No acute abnormality in the abdomen or pelvis. 5. Aortic atherosclerosis. Electronically Signed   By: Ilona Sorrel M.D.   On: 11/07/2016 07:54     Labs:   Basic Metabolic Panel:  Recent Labs Lab 11/07/16 0107 11/07/16 0912 11/08/16 0556 11/09/16 0748 11/10/16 0740  NA 136  --  137 136 138  K 3.4*  --  4.0 4.1 4.0  CL 100*  --  103 103 100*  CO2 24  --  _0 GLUCOSE 94  --  88 94 93  BUN 9  --  <5* <5* 5*  CREATININE 0.80 0.62 0.60 0.52 0.71  CALCIUM 8.8*  --  8.6* 8.7* 8.9   GFR Estimated Creatinine Clearance: 84.9 mL/min (by C-G formula based on SCr of 0.71 mg/dL). Liver Function Tests:  Recent Labs Lab 11/07/16 0325 11/08/16 0556 11/10/16 0740  AST 47* 62* 44*  ALT 50 58* 59*  ALKPHOS 122 130* 139*  BILITOT 0.3 0.8 0.3  PROT 6.9 6.7 6.9  ALBUMIN 3.2* 3.4* 3.4*    Recent Labs Lab 11/07/16 0107  LIPASE 16   No results for input(s): AMMONIA in the last 168 hours. Coagulation profile No results for input(s): INR, PROTIME in the last 168 hours.  CBC:  Recent Labs Lab 11/07/16 0107 11/07/16 0912 11/09/16 0748 11/10/16 0740  WBC 11.2* 10.8* 10.9* 10.1  HGB 10.1* 9.8* 10.2* 10.6*  HCT 30.6* 30.0* 31.0* 32.5*  MCV 84.3 84.7 85.6 85.5  PLT 424* 410* 392 452*   Cardiac Enzymes: No results for input(s): CKTOTAL, CKMB, CKMBINDEX, TROPONINI in the last 168 hours. BNP: Invalid input(s): POCBNP CBG: No results for input(s): GLUCAP in the last 168 hours. D-Dimer No results for input(s): DDIMER in the last 72 hours. Hgb A1c No results for input(s): HGBA1C in the last 72 hours. Lipid Profile No results for  input(s): CHOL, HDL, LDLCALC, TRIG, CHOLHDL, LDLDIRECT in the last 72 hours. Thyroid function studies No results for input(s): TSH, T4TOTAL, T3FREE, THYROIDAB in the last 72 hours.  Invalid input(s): FREET3 Anemia work up No results for input(s): VITAMINB12, FOLATE, FERRITIN, TIBC, IRON, RETICCTPCT in the last 72 hours. Microbiology Recent Results (from the past 240 hour(s))  Culture, blood (routine x 2)     Status: None (Preliminary result)   Collection Time: 11/07/16  9:19 AM  Result Value Ref Range Status   Specimen Description BLOOD RIGHT FOREARM  Final   Special Requests BOTTLES DRAWN AEROBIC AND ANAEROBIC 5CC  Final   Culture NO GROWTH 3 DAYS  Final   Report Status PENDING  Incomplete  Fungus culture, blood     Status: None (Preliminary result)   Collection Time: 11/07/16  9:19 AM  Result Value Ref Range Status   Specimen Description BLOOD RIGHT FOREARM  Final   Special Requests BOTTLES DRAWN AEROBIC AND ANAEROBIC 5CC  Final   Culture NO GROWTH 3 DAYS  Final   Report Status PENDING  Incomplete  Culture, blood (routine x 2)     Status: None (Preliminary result)   Collection Time: 11/07/16  9:28 AM  Result Value Ref Range Status   Specimen Description BLOOD RIGHT ANTECUBITAL  Final   Special Requests BOTTLES DRAWN AEROBIC AND ANAEROBIC 5CC  Final   Culture NO GROWTH 3 DAYS  Final   Report Status PENDING  Incomplete  Surgical PCR screen     Status: None   Collection Time: 11/07/16  6:37 PM  Result Value Ref Range Status   MRSA, PCR NEGATIVE NEGATIVE Final   Staphylococcus aureus NEGATIVE NEGATIVE Final    Comment:        The Xpert SA Assay (FDA approved for NASAL specimens in patients over 12 years of age), is one component of a comprehensive surveillance program.  Test performance has been validated by [facility] for patients greater than or equal to 80 year old. It is not intended to diagnose infection nor to guide or monitor treatment.   Culture,  bal-quantitative     Status: None (Preliminary result)   Collection Time: 11/08/16  2:12 PM  Result Value Ref Range Status   Specimen Description BRONCHIAL ALVEOLAR LAVAGE RIGHT  Final   Special Requests  RIGHT LOBE  Final   Gram Stain   Final    MODERATE WBC PRESENT,BOTH PMN AND MONONUCLEAR NO ORGANISMS SEEN    Culture NO GROWTH 2 DAYS  Final   Report Status PENDING  Incomplete  Pneumocystis smear by DFA     Status: None   Collection Time: 11/08/16  2:12 PM  Result Value Ref Range Status   Specimen Source-[facility] BRONCHIAL ALVEOLAR LAVAGE  Final    Comment: RIGHT LOBE   Pneumocystis jiroveci Ag NEGATIVE  Final    Comment: Performed at [facility]  Culture, bal-quantitative     Status: None (Preliminary result)   Collection Time: 11/08/16  2:12 PM  Result Value Ref Range Status   Specimen Description BRONCHIAL  ALVEOLAR LAVAGE RIGHT  Final   Special Requests RIGHT MIDDLE LOBE  Final   Gram Stain   Final    ABUNDANT WBC PRESENT,BOTH PMN AND MONONUCLEAR NO ORGANISMS SEEN    Culture CULTURE REINCUBATED FOR BETTER GROWTH  Final   Report Status PENDING  Incomplete  Acid Fast Smear (AFB)     Status: None   Collection Time: 11/08/16  2:12 PM  Result Value Ref Range Status   AFB Specimen Processing Concentration  Final   Acid Fast Smear Negative  Final    Comment: (NOTE) Performed At: [facility] [place], [place] [id number] Lindon Romp MD GH:[id number]    Source (AFB) BRONCHIAL ALVEOLAR LAVAGE  Final    Comment:  RIGHT LOBE  Pneumocystis smear by DFA     Status: None   Collection Time: 11/08/16  2:12 PM  Result Value Ref Range Status   Specimen Source-[facility] BRONCHIAL ALVEOLAR LAVAGE  Final    Comment:  RIGHT MIDDLE LOBE   Pneumocystis jiroveci Ag NEGATIVE  Final    Comment: Performed at [facility]  Acid Fast Smear (AFB)     Status: None   Collection Time: 11/08/16  2:12 PM  Result Value Ref Range Status   AFB  Specimen Processing Concentration  Final   Acid Fast Smear Negative  Final    Comment: (NOTE) Performed At: [facility] [place], [place] [id number] Lindon Romp MD PZ:[id number]    Source (AFB) BRONCHIAL ALVEOLAR LAVAGE  Final    Comment:  RIGHT MIDDLE LOBE     Discharge Instructions:   Discharge Instructions    Call MD for:  difficulty breathing, headache or visual disturbances    Complete by:  As directed    Call MD for:  temperature >100.4    Complete by:  As directed    Diet - low sodium heart healthy    Complete by:  As directed    Discharge instructions    Complete by:  As directed    Pulmonary appointment scheduled- will get CT Scan before appointment   Increase activity slowly    Complete by:  As directed      Allergies as of 11/10/2016      Reactions   Ceclor [cefaclor] Hives   Patient said she can tolerate penicillin.  Hasn't had Keflex.      Medication List    TAKE these medications   ENTYVIO 300 MG injection Generic drug:  vedolizumab Inject 300 mg into the vein every 14 (fourteen) days.   HYDROcodone-acetaminophen 5-325 MG tablet Commonly known as:  NORCO/VICODIN Take 1-2 tablets by mouth every 4 (four) hours as needed for moderate pain.   lisinopril-hydrochlorothiazide 10-12.5 MG tablet Commonly known as:  PRINZIDE,ZESTORETIC Take 1 tablet by mouth daily.   omeprazole 40 MG capsule Commonly known as:  PRILOSEC Take 40 mg by mouth daily.   traMADol 50 MG tablet Commonly known as:  ULTRAM Take 1 tablet (50 mg total) by mouth every 6 (six) hours as needed for moderate pain.      Follow-up Information    Tera Partridge, MD Follow up on 12/22/2016.   Specialty:  Pulmonary Disease Why:  Appt at 2:45 PM  Contact information: [address] [place] [address] [phone]        BARNES,ELIZABETH STEWART, MD Follow up in 1 week(s).   Specialty:  Family Medicine Contact information: [place]  [id number] [phone]            Time coordinating discharge: 35 min  Signed:  Mithran Strike U Ron Junco   [facility] 11/10/2016, 2:37 PM

## 2016-11-10 NOTE — Telephone Encounter (Signed)
Yes I will take care of it

## 2016-11-10 NOTE — Telephone Encounter (Signed)
Libby,   Can we please set Mrs. Tonelli up with a non-contrast CT of the chest before she comes back to see Dr. Ashok Cordia on 4/25 (ideally as close to that date as possible) to follow up on pulmonary nodules.  Please let me know if you need anything.  She is currently admitted to [facility].    Noe Gens, NP-C [place] [facility] Pgr: [phone] or if no answer [phone] 11/10/2016, 12:50 PM

## 2016-11-10 NOTE — Progress Notes (Signed)
Name: Gabrielle Wilkins MRN: [id number] DOB: Dec 19, 1975    ADMISSION DATE:  11/07/2016 CONSULTATION DATE:  11/07/16  REFERRING MD :  Dr. Janne Napoleon   CHIEF COMPLAINT:  Chest / epigastric pain   HISTORY OF PRESENT ILLNESS:  41 y/o F admitted on 3/11 with complaints of several days of chest / epigastric pain, worse with inspiration.  She has a history of ulcerative colitis on Entyvio (vedolizumab).  She recently switched from Remicade (induced psoriasis) to [facility].  Her first dose was on Feb 16th and second dose on March 2nd.  She had a negative TB skin test prior to transition to [facility].   The patient reports she has had about 6 weeks of mid-sternal and right chest pain (under her right breast).  She was initially seen by her PCP for chest discomfort and an EKG was assessed which was negative.  Subsequently, she was treated as GERD with omeprazole.  Her prescription ran out and she felt better.  Later on 3/7, the pain returned and was intermittent.  She woke with 3/11 early am with chest pain/discomfort and sought care at the ER.    Initial labs - Na 136, K 3.4, Cl 100, sr cr 0.80, AG 12, troponin 0.0, WBC 10.8, hgb 9.8 and platelets 410.  CTA of the chest, abdomen & pelvis performed which was neg for PE but showed numerous irregular round solid pulmonary nodules bilaterally, largest 1.6 cm in the RML, no LAN in chest/abd/pelvis, no acute abnormality in the abdomen / pelvis.    Of note, she recently flew to [place] to visit her brother after quadruple bypass (he happens to be HIV positive and is compliant with therapy, no TB known).    The patient denies cough, fevers, chills, sputum production, weight loss, decreased appetite and night sweats.   PCCM consulted for evaluation.   SUBJECTIVE: one episode of hemoptysis this AM that appeared like old blood  VITAL SIGNS: Temp:  [98 F (36.7 C)-98.8 F (37.1 C)] 98 F (36.7 C) (03/14 0453) Pulse Rate:  [57-81] 74 (03/14 0829) Resp:  [18] 18  (03/14 0453) BP: (101-128)/(65-85) 128/85 (03/14 0829) SpO2:  [99 %-100 %] 100 % (03/14 0829)  PHYSICAL EXAMINATION: General: Well developed adult female in NAD, sitting up on couch HEENT: [place]/AT, PERRL, EOM-I and MMM Neuro: Alert and oriented, moving all ext to command CV: RRR, Nl S1/S2, -M/R/G. PULM: CTA bilaterally GI: Soft, NT, ND and +BS Extremities: -edema and -tenderness. Skin: Intact  Recent Labs Lab 11/08/16 0556 11/09/16 0748 11/10/16 0740  NA 137 136 138  K 4.0 4.1 4.0  CL 103 103 100*  CO2 _0 BUN <5* <5* 5*  CREATININE 0.60 0.52 0.71  GLUCOSE 88 94 93   Recent Labs Lab 11/07/16 0912 11/09/16 0748 11/10/16 0740  HGB 9.8* 10.2* 10.6*  HCT 30.0* 31.0* 32.5*  WBC 10.8* 10.9* 10.1  PLT 410* 392 452*   SIGNIFICANT EVENTS  3/11 Admit with chest / epigastric pain.  CT with multiple irregular solid pulmonary nodules  STUDIES:  CTA Chest, ABD/Pelvis 3/11 >> neg for PE, numerous irregular round solid pulmonary nodules bilaterally, largest 1.6 cm in the RML, no LAN in chest/abd/pelvis, no acute abnormality in the abdomen / pelvis ESR 3/11 >> 47  I reviewed CT of the chest myself, pulmonary nodules noted but none that can be reached bronchoscopically without navigation  ASSESSMENT / PLAN:  41 y/o F on immune suppression for ulcerative colitis with recent change in therapy  who presented with chest pain.  CT with bilateral pulmonary nodules, PE ruled out.  DDx includes bacterial / fungal etiologies, autoimmune, less likely malignant but can not rule out without tissue sampling.  No LAN on CT.  AFB on BAL is negative with some inflammatory cells and fungal cultures pending.  Discussed with [facility]-MD Eliseo Squires.  Bilateral Pulmonary Nodules   - Repeat CT in 6 weeks for f/u  - F/u on fungal cultures  - F/u with Dr. 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  Hemoptysis:  - Monitor clinically  - See below.  Spoke with patient, appointment made, gave option of CT in 6 wks to evaluate change in size vs nav bronch now, she elected 6 wks CT follow up.  Advised that if more hemoptysis to call office sooner or come to the hospital and advised about how to evaluate fresh vs old blood.  PCCM will S/O please call back if needed.  Rush Farmer, M.D. [facility] Pulmonary/Critical Care Medicine. Pager: [phone]. After hours pager: [phone].  11/10/2016, 2:02 PM

## 2016-11-10 NOTE — Discharge Instructions (Signed)
Bowel regimen while on pain medications  Follow with Gerrit Heck, MD in 5-7 days  Please get a complete blood count and chemistry panel checked by your Primary MD at your next visit, and again as instructed by your Primary MD. Please get your medications reviewed and adjusted by your Primary MD.  Please request your Primary MD to go over all Hospital Tests and Procedure/Radiological results at the follow up, please get all Hospital records sent to your Prim MD by signing hospital release before you go home.  If you had Pneumonia of Lung problems at the Hospital: Please get a 2 view Chest X ray done in 6-8 weeks after hospital discharge or sooner if instructed by your Primary MD.  If you have Congestive Heart Failure: Please call your Cardiologist or Primary MD anytime you have any of the following symptoms:  1) 3 pound weight gain in 24 hours or 5 pounds in 1 week  2) shortness of breath, with or without a dry hacking cough  3) swelling in the hands, feet or stomach  4) if you have to sleep on extra pillows at night in order to breathe  Follow cardiac low salt diet and 1.5 lit/day fluid restriction.  If you have diabetes Accuchecks 4 times/day, Once in AM empty stomach and then before each meal. Log in all results and show them to your primary doctor at your next visit. If any glucose reading is under 80 or above 300 call your primary MD immediately.  If you have Seizure/Convulsions/Epilepsy: Please do not drive, operate heavy machinery, participate in activities at heights or participate in high speed sports until you have seen by Primary MD or a Neurologist and advised to do so again.  If you had Gastrointestinal Bleeding: Please ask your Primary MD to check a complete blood count within one week of discharge or at your next visit. Your endoscopic/colonoscopic biopsies that are pending at the time of discharge, will also need to followed by your Primary MD.  Get  Medicines reviewed and adjusted. Please take all your medications with you for your next visit with your Primary MD  Please request your Primary MD to go over all hospital tests and procedure/radiological results at the follow up, please ask your Primary MD to get all Hospital records sent to his/her office.  If you experience worsening of your admission symptoms, develop shortness of breath, life threatening emergency, suicidal or homicidal thoughts you must seek medical attention immediately by calling 911 or calling your MD immediately  if symptoms less severe.  You must read complete instructions/literature along with all the possible adverse reactions/side effects for all the Medicines you take and that have been prescribed to you. Take any new Medicines after you have completely understood and accpet all the possible adverse reactions/side effects.   Do not drive or operate heavy machinery when taking Pain medications.   Do not take more than prescribed Pain, Sleep and Anxiety Medications  Special Instructions: If you have smoked or chewed Tobacco  in the last 2 yrs please stop smoking, stop any regular Alcohol  and or any Recreational drug use.  Wear Seat belts while driving.  Please note You were cared for by a hospitalist during your hospital stay. If you have any questions about your discharge medications or the care you received while you were in the hospital after you are discharged, you can call the unit and asked to speak with the hospitalist on call if the hospitalist that took  care of you is not available. Once you are discharged, your primary care physician will handle any further medical issues. Please note that NO REFILLS for any discharge medications will be authorized once you are discharged, as it is imperative that you return to your primary care physician (or establish a relationship with a primary care physician if you do not have one) for your aftercare needs so that they  can reassess your need for medications and monitor your lab values.  You can reach the hospitalist office at phone [phone] or fax [phone]   If you do not have a primary care physician, you can call [phone] for a physician referral.  Activity: As tolerated with Full fall precautions use walker/cane & assistance as needed

## 2016-11-11 LAB — CULTURE, BAL-QUANTITATIVE: CULTURE: NO GROWTH

## 2016-11-11 LAB — CULTURE, BAL-QUANTITATIVE W GRAM STAIN: Culture: 30000 — AB

## 2016-11-11 LAB — HISTOPLASMA ANTIGEN, URINE

## 2016-11-12 LAB — CULTURE, BLOOD (ROUTINE X 2)
CULTURE: NO GROWTH
Culture: NO GROWTH

## 2016-11-13 LAB — CULTURE, RESPIRATORY: CULTURE: NORMAL

## 2016-11-13 LAB — CULTURE, RESPIRATORY W GRAM STAIN

## 2016-11-14 LAB — FUNGUS CULTURE, BLOOD: CULTURE: NO GROWTH

## 2016-11-18 ENCOUNTER — Telehealth: Payer: Self-pay | Admitting: Pulmonary Disease

## 2016-11-18 ENCOUNTER — Telehealth: Payer: Self-pay | Admitting: Internal Medicine

## 2016-11-18 NOTE — Telephone Encounter (Signed)
Patient recently noted to have multiple lung nodules on CT 11/07/16. She remains fatigued. Dr Drema Dallas did cxr today, similar to CT. I reviewed CT from 3/11. Plan- Dr Drema Dallas will order HR noncontrast CT to be done in 1 week from now. We will try to move up current pending appointment with this office. Patient can be reached on her mobile number.  Order- please try to schedule earlier appointment this office with Dr Ashok Cordia or one of our doctors who does bronchoscopy.

## 2016-11-18 NOTE — Telephone Encounter (Signed)
Dr Drema Dallas called back. WBC now 14000. 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 Cultures from hosp BAL were neg on 3/11/  Dr Drema Dallas is going to start empiric levaquin. We will try to get her in earlier.

## 2016-11-19 ENCOUNTER — Other Ambulatory Visit: Payer: Self-pay | Admitting: Family Medicine

## 2016-11-19 DIAGNOSIS — R918 Other nonspecific abnormal finding of lung field: Secondary | ICD-10-CM

## 2016-11-19 NOTE — Telephone Encounter (Signed)
Dr. Nelda Marseille please advise if you've spoken with Dr. Drema Dallas.  Thanks!

## 2016-11-19 NOTE — Telephone Encounter (Signed)
Spoke with patient-she is aware I was able to get her in April 12,18 at 10:45 am with JN. Pt will wait for Dr Drema Dallas office to contact her for HR noncontrast CT to be scheduled. Nothing more needed at this time from our office.

## 2016-11-23 NOTE — Telephone Encounter (Signed)
Dr. Nelda Marseille please advise if this message can be closed.  Thanks!

## 2016-11-29 ENCOUNTER — Other Ambulatory Visit: Payer: Self-pay | Admitting: Pulmonary Disease

## 2016-11-29 ENCOUNTER — Ambulatory Visit
Admission: RE | Admit: 2016-11-29 | Discharge: 2016-11-29 | Disposition: A | Discharge status: Home / Self Care | Payer: BC Managed Care – PPO | Source: Ambulatory Visit | Attending: Family Medicine | Admitting: Family Medicine

## 2016-11-29 DIAGNOSIS — R918 Other nonspecific abnormal finding of lung field: Secondary | ICD-10-CM

## 2016-12-02 ENCOUNTER — Telehealth: Payer: Self-pay | Admitting: Pulmonary Disease

## 2016-12-02 NOTE — Telephone Encounter (Signed)
Dr. Nelda Marseille please advise if you've spoken to Dr. Drema Dallas.  Thank you.

## 2016-12-02 NOTE — Telephone Encounter (Signed)
Dr. Drema Dallas is requesting to speak with JN regarding this patient.  JN please advise when you've spoken with Dr. Drema Dallas.  Thanks!

## 2016-12-03 NOTE — Telephone Encounter (Signed)
Spoke with Dr. Drema Dallas.

## 2016-12-03 NOTE — Telephone Encounter (Signed)
Who is Dr. Drema Dallas and what number can they be reached at? J.

## 2016-12-03 NOTE — Telephone Encounter (Signed)
Dr. Drema Dallas is with [facility]- call [phone] and dial option 1.  Thanks!

## 2016-12-06 ENCOUNTER — Other Ambulatory Visit (HOSPITAL_COMMUNITY)
Admission: RE | Admit: 2016-12-06 | Discharge: 2016-12-06 | Disposition: A | Discharge status: Home / Self Care | Payer: BC Managed Care – PPO | Source: Ambulatory Visit | Attending: Family Medicine | Admitting: Family Medicine

## 2016-12-06 ENCOUNTER — Other Ambulatory Visit: Payer: Self-pay | Admitting: Family Medicine

## 2016-12-06 DIAGNOSIS — Z124 Encounter for screening for malignant neoplasm of cervix: Secondary | ICD-10-CM | POA: Insufficient documentation

## 2016-12-06 NOTE — Telephone Encounter (Signed)
Dr. Nelda Marseille please advise if you have spoken with Dr. Drema Dallas and we can close this message. thanks

## 2016-12-07 LAB — CYTOLOGY - PAP: Diagnosis: NEGATIVE

## 2016-12-09 ENCOUNTER — Telehealth: Payer: Self-pay | Admitting: Pulmonary Disease

## 2016-12-09 ENCOUNTER — Encounter: Payer: Self-pay | Admitting: Pulmonary Disease

## 2016-12-09 ENCOUNTER — Ambulatory Visit (INDEPENDENT_AMBULATORY_CARE_PROVIDER_SITE_OTHER): Payer: BC Managed Care – PPO | Admitting: Pulmonary Disease

## 2016-12-09 ENCOUNTER — Other Ambulatory Visit: Payer: BC Managed Care – PPO

## 2016-12-09 VITALS — BP 140/90 | HR 73 | Ht 62.0 in | Wt 146.8 lb

## 2016-12-09 DIAGNOSIS — R091 Pleurisy: Secondary | ICD-10-CM | POA: Diagnosis not present

## 2016-12-09 DIAGNOSIS — R918 Other nonspecific abnormal finding of lung field: Secondary | ICD-10-CM | POA: Diagnosis not present

## 2016-12-09 NOTE — Addendum Note (Signed)
Addended by: Tyson Dense on: 12/09/2016 03:07 PM   Modules accepted: Orders

## 2016-12-09 NOTE — Patient Instructions (Signed)
   We will try to schedule you for your procedure next Wednesday and let you know if you need a new chest CT scan.  Call or e-mail me if you have any new questions or concerns.  TESTS ORDERED: 1. Serum Beta-D Glucan (Fungitel), Aspergillus Antigen & ANCA Panel. 2. Urine Blastomyces Antigen

## 2016-12-09 NOTE — Telephone Encounter (Signed)
[facility] [phone]) was contacted. Radiology was asked to transform CT Chest from 11/29/16 to a Navigational CD. They stated they could and would put a rush on it and place it in the carrier for tomorrow; it will be addressed to Dr. Ashok Cordia. JN was made aware of information. Nothing further is needed.

## 2016-12-09 NOTE — Progress Notes (Signed)
Subjective:    Patient ID: Gabrielle Wilkins, female    DOB: 07-05-1976, 41 y.o.   MRN: [id number]  C.C.:  Follow-up for Multiple Lung Nodules on CT Imaging & Pleurisy.   HPI Multiple Lung Nodules on CT Imaging: Patient was previously hospitalized and underwent bronchoscopy with lavage on 11/08/16. Cultures remain negative although the nodules appear to be enlarging in size. She reports she has continued dyspnea on exertion. She has to stop after 3 flights of stairs. Her dyspnea has been evident since she left the hospital. She denies any cough but previously did have a nonproductive cough. Her cough has essentially resolved over the last week. Has known recent travel to [facility] but no sick contacts. She has been off of Entyvio for 6 weeks. Previously had 2 doses (1st dose 2/16 & 2nd 3/2).  Pleurisy:  She reports her pain has improved since her discharge. She still doe have some discomfort with deep inspiration.   Review of Systems She has had night sweats. No fever or chills. No sinus congestion, drainage, or epistaxis. She reports her previously known rash has been improving. No new bruising.   Allergies  Allergen Reactions  . Ceclor [Cefaclor] Hives    Patient said she can tolerate penicillin.  Hasn't had Keflex.     Current Outpatient Prescriptions on File Prior to Visit  Medication Sig Dispense Refill  . lisinopril-hydrochlorothiazide (PRINZIDE,ZESTORETIC) 10-12.5 MG per tablet Take 1 tablet by mouth daily.     Marland Kitchen omeprazole (PRILOSEC) 40 MG capsule Take 40 mg by mouth daily.    Marland Kitchen HYDROcodone-acetaminophen (NORCO/VICODIN) 5-325 MG tablet Take 1-2 tablets by mouth every 4 (four) hours as needed for moderate pain. (Patient not taking: Reported on 12/09/2016) 30 tablet 0  . traMADol (ULTRAM) 50 MG tablet Take 1 tablet (50 mg total) by mouth every 6 (six) hours as needed for moderate pain. (Patient not taking: Reported on 12/09/2016) 30 tablet 0  . vedolizumab (ENTYVIO) 300 MG  injection Inject 300 mg into the vein every 14 (fourteen) days.     No current facility-administered medications on file prior to visit.     Past Medical History:  Diagnosis Date  . Depression   . Hypertension    Chronic Hypertension  . Migraine, ophthalmoplegic   . Pregnancy induced hypertension   . Ulcerative colitis   . Ulcerative colitis [facility]: Lambert Rhonda W)     Past Surgical History:  Procedure Laterality Date  . CESAREAN SECTION  04/05/2011   Procedure: CESAREAN SECTION;  Surgeon: Logan Bores;  Location: [facility];  Service: Gynecology;  Laterality: N/A;  baby boy 17  APGAR 7/9  . CHOLECYSTECTOMY  2008  . COLONOSCOPY N/A   . DILATION AND CURETTAGE OF UTERUS    . VIDEO BRONCHOSCOPY Bilateral 11/08/2016   Procedure: VIDEO BRONCHOSCOPY WITHOUT FLUORO;  Surgeon: Rush Farmer, MD;  Location: [place] [facility];  Service: Cardiopulmonary;  Laterality: Bilateral;    Family History  Problem Relation Age of Onset  . Pancreatic cancer Mother   . Hypertension Mother   . Heart attack Father   . Alcoholism Father   . Cirrhosis Father   . Breast cancer Father   . Hypertension Brother   . Arthritis/Rheumatoid Brother   . Hypertension Brother   . HIV Brother   . Bipolar disorder Brother   . Hypertension Brother   . Heart attack Other 14    Niece  . Breast cancer Maternal Grandmother   . Breast cancer Paternal Grandmother  Social History   Social History  . Marital status: Married    Spouse name: N/A  . Number of children: N/A  . Years of education: N/A   Social History Main Topics  . Smoking status: Passive Smoke Exposure - Never Smoker  . Smokeless tobacco: Never Used     Comment: Father as a child  . Alcohol use 1.8 oz/week    3 Standard drinks or equivalent per week  . Drug use: No  . Sexual activity: Yes   Other Topics Concern  . None   Social History Narrative   [facility] (12/09/16):   Originally from [place]. She moved to [place] in 1998. 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 No known mold exposure. Does have an outdoor cat & an indoor Geneticist, molecular. No bird exposure. No hot tub exposure. Does have a couple of indoor plants. Enjoys cooking. Married with 3 kids.       Objective:   Physical Exam BP 140/90 (BP Location: Right Arm, Patient Position: Sitting, Cuff Size: Normal)   Pulse 73   Ht _0  (1.575 m)   Wt 146 lb 12.8 oz (66.6 kg)   SpO2 100%   BMI 26.85 kg/m  General:  Awake. Alert. No acute distress. Husband with patient today. Integument:  Warm & dry. No rash on exposed skin. No bruising on exposed skin. Extremities:  No cyanosis or clubbing.  Lymphatics: No appreciated cervical or supraclavicular lymphadenopathy. HEENT:  Moist mucus membranes. No oral ulcers. No scleral injection or icterus. Mallampati class I. Cardiovascular:  Regular rate. No edema. Normal S1 & S2.  Pulmonary:  Good aeration & clear to auscultation bilaterally. Symmetric chest wall expansion. No accessory muscle use on room air. Abdomen: Soft. Normal bowel sounds. Nondistended.  Musculoskeletal:  Normal bulk and tone. No joint deformity or effusion appreciated.  IMAGING: CT CHEST W/O 11/29/16 (personally reviewed by me): Bilateral lung nodules that are lower lobe predominant. Some evidence of enlargement. Solid and groundglass component to the nodules. Right middle lobe lateral segment nodule appears to have airway running right through it. No pleural effusion or thickening. No pericardial effusion. No pathologic mediastinal adenopathy.  MICROBIOLOGY: Urine streptococcal antigen (11/07/16):  Negative  Blood Cultures x2 (11/07/16):  Negative Blood fungal culture (11/07/16):  Negative  QuantiFERON-TB (11/07/16): Negative HIV (11/07/16): Nonreactive BAL (11/08/16):  Normal respiratory flora / PCP negative / AFB pending  Urine Histoplasma antigen (11/10/16): Negative Sputum culture (11/10/16):  Negative   PATHOLOGY: BAL RML & RLL (11/08/16):   Negative for malignancy.     Assessment & Plan:  41 y.o. female on immunosuppression with monoclonal antibody for ulcerative colitis presenting with bilateral lower lobe predominant nodules with some groundglass. Nodule within lateral segment of right middle lobe appears to have an airway going through it. This would be an excellent navigational bronchoscopy case I feel. I am concerned about the possibility of an atypical infection from Blastomyces given her previous travel, and immunosuppression. Certainly malignancy must be considered given her immunosuppression but I feel this is less likely. An autoimmune process is also possible but again slightly less likely. I discussed the risks and benefits of bronchoscopy with endotracheal intubation and general anesthesia including bleeding, infection, pneumothorax, and potentially death. The patient is willing to undergo the procedure knowing these risks.  1. Multiple lung nodules: Checking urine Blastomyces antigen. Checking serum beta D glucan, Aspergillus antigen, and ANCA panel.  Plan for navigational bronchoscopy next week after I review the case  with Dr. Lamonte Sakai. 2. Pleurisy: Likely secondary to her poor peripheral lesions. Improving. No new medications. 3. Follow-up: Patient to return to clinic in 2 weeks to review the results of her procedure.  Sonia Baller Ashok Cordia, M.D. [facility] Pager:  [phone] After 3pm or if no response, call [phone] 12:39 PM 12/09/16

## 2016-12-10 NOTE — Pre-Procedure Instructions (Signed)
    Gabrielle Wilkins  12/10/2016      [place], [place] [name] [place] [address] Phone: [phone] Fax: [phone]    Your procedure is scheduled on Wednesday, April 18th   Report to [facility] Admitting at 8:30 AM             (posted surgery time is 10:30 am - 12:15 pm)   Call this number if you have problems or questions regarding surgery:  [phone], Mon - Fri from 8 - 4:30 pm.  Or call [phone]   Remember:  Do not eat food or drink liquids after midnight Tuesday.   Take these medicines the morning of surgery with A SIP OF WATER : Omeprazole, Hydrocodone.               4-5 days prior to surgery, STOP taking any Vitamins, Herbal Supplements, Anti-inflammatories.   Do not wear jewelry, make-up or nail polish.  Do not wear lotions, powders,  perfumes, or deoderant.  Do not shave 48 hours prior to surgery.    Do not bring valuables to the hospital.  [facility] is not responsible for any belongings or valuables.  Contacts, dentures or bridgework may not be worn into surgery.  Leave your suitcase in the car.  After surgery it may be brought to your room.  Patients discharged the day of surgery will not be allowed to drive home, and you will need someone to stay with you for the first 24 hours afterward.  Please read over the following fact sheets that you were given. Pain Booklet, Surgical Site Infection Prevention and Anesthesia Post-op Instructions

## 2016-12-11 LAB — FUNGITELL, SERUM: Fungitell Result: <31 pg/mL (ref <80)

## 2016-12-11 LAB — ASPERGILLUS ANTIGEN,SERUM
Aspergillus Ag, EIA: NOT DETECTED
Index Value: 0.08 (ref <0.50)

## 2016-12-13 ENCOUNTER — Telehealth: Payer: Self-pay | Admitting: Emergency Medicine

## 2016-12-13 ENCOUNTER — Encounter (HOSPITAL_COMMUNITY): Payer: Self-pay

## 2016-12-13 ENCOUNTER — Encounter (HOSPITAL_COMMUNITY)
Admission: RE | Admit: 2016-12-13 | Discharge: 2016-12-13 | Disposition: A | Discharge status: Home / Self Care | Payer: BC Managed Care – PPO | Source: Ambulatory Visit | Attending: Emergency Medicine | Admitting: Emergency Medicine

## 2016-12-13 DIAGNOSIS — R091 Pleurisy: Secondary | ICD-10-CM | POA: Diagnosis not present

## 2016-12-13 DIAGNOSIS — I10 Essential (primary) hypertension: Secondary | ICD-10-CM | POA: Diagnosis not present

## 2016-12-13 DIAGNOSIS — Z79899 Other long term (current) drug therapy: Secondary | ICD-10-CM | POA: Diagnosis not present

## 2016-12-13 DIAGNOSIS — R918 Other nonspecific abnormal finding of lung field: Secondary | ICD-10-CM | POA: Diagnosis not present

## 2016-12-13 DIAGNOSIS — K5190 Ulcerative colitis, unspecified, without complications: Secondary | ICD-10-CM | POA: Diagnosis not present

## 2016-12-13 DIAGNOSIS — Z881 Allergy status to other antibiotic agents status: Secondary | ICD-10-CM | POA: Diagnosis not present

## 2016-12-13 HISTORY — DX: Anemia, unspecified: D64.9

## 2016-12-13 HISTORY — DX: Gastro-esophageal reflux disease without esophagitis: K21.9

## 2016-12-13 HISTORY — DX: Dyspnea, unspecified: R06.00

## 2016-12-13 HISTORY — DX: Personal history of diseases of the skin and subcutaneous tissue: Z87.2

## 2016-12-13 LAB — BASIC METABOLIC PANEL
ANION GAP: 7 (ref 5–15)
BUN: 12 mg/dL (ref 6–20)
CHLORIDE: 106 mmol/L (ref 101–111)
CO2: 25 mmol/L (ref 22–32)
Calcium: 9.2 mg/dL (ref 8.9–10.3)
Creatinine, Ser: 0.65 mg/dL (ref 0.44–1.00)
GFR calc non Af Amer: >60 mL/min (ref >60)
GLUCOSE: 88 mg/dL (ref 65–99)
Potassium: 4 mmol/L (ref 3.5–5.1)
Sodium: 138 mmol/L (ref 135–145)

## 2016-12-13 LAB — CBC
HCT: 34 % — ABNORMAL LOW (ref 36.0–46.0)
HEMOGLOBIN: 11 g/dL — AB (ref 12.0–15.0)
MCH: 26.6 pg (ref 26.0–34.0)
MCHC: 32.4 g/dL (ref 30.0–36.0)
MCV: 82.3 fL (ref 78.0–100.0)
Platelets: 411 10*3/uL — ABNORMAL HIGH (ref 150–400)
RBC: 4.13 MIL/uL (ref 3.87–5.11)
RDW: 13.9 % (ref 11.5–15.5)
WBC: 11.4 10*3/uL — AB (ref 4.0–10.5)

## 2016-12-13 LAB — HCG, SERUM, QUALITATIVE: Preg, Serum: NEGATIVE

## 2016-12-13 LAB — ANCA SCREEN W REFLEX TITER: ANCA SCREEN: NEGATIVE

## 2016-12-13 NOTE — Telephone Encounter (Signed)
Shanon Brow with [facility] returned call stating pt needs the [place] authorized. Shanon Brow states CPT [phone] was used.   Libby please advise. Thanks.

## 2016-12-13 NOTE — Telephone Encounter (Signed)
LMTCB for Gabrielle Wilkins.

## 2016-12-13 NOTE — Telephone Encounter (Signed)
precert pending with bcbs Joellen [place]

## 2016-12-14 NOTE — Progress Notes (Signed)
Anesthesia Chart Review: Patient is a 41 year old female scheduled for video bronchoscopy with endobronchial naviagion on 12/15/16 by Dr. Lamonte Sakai.  History includes passive smoking exposure, HTN, ulcerative colitis, pregnancy induced hypertension, migraines, GERD, depression (remote), anemia, psoriasis, cholecystectomy.  Pulmonologist is Dr. Tera Partridge. According to his 12/09/16 note, patient previously underwent bronchoscopy with lavage on 11/08/16 with result as listed:   MICROBIOLOGY:  Urine streptococcal antigen (11/07/16):  Negative   Blood Cultures x2 (11/07/16):  Negative  Blood fungal culture (11/07/16):  Negative   QuantiFERON-TB (11/07/16): Negative  HIV (11/07/16): Nonreactive  BAL (11/08/16):  Normal respiratory flora / PCP negative / AFB pending   Urine Histoplasma antigen (11/10/16): Negative  Sputum culture (11/10/16):  Negative   PATHOLOGY:  BAL RML & RLL (11/08/16):  Negative for malignancy.  However, on follow-up CT, nodules appeared to be enlarging. He was "concerned about the possibility of an atypical infection from Blastomyces given her previous travel, and immunosuppression. Certainly malignancy must be considered given her immunosuppression but I feel this is less likely. An autoimmune process is also possible but again slightly less likely." He recommended the above procedure. He also ordered urine Blastomyces antigen. Checking serum beta D glucan, Aspergillus antigen, and ANCA panel.  Meds include lisinopril-HCTZ, Prilosec.  BP (P) 137/85   Pulse (P) 79   Temp (P) 36.8 C   Resp (P) 18   Ht (P) _0  (1.575 m)   LMP 12/07/2016   SpO2 (P) 100%   EKG 11/07/16: NSR, low voltage QRS, septal infarct (age undetermined). Anterior Q waves are new when compared to prior tracing in [facility] from 11/02/09.  Echo 08/25/11 [facility]): Conclusions: LVEF 57%, interatrial septum is aneurysmal, trace MR, mild AR, trivial TR, trace PR.  Chest CT 11/29/16: IMPRESSION: 1. Numerous irregular  pulmonary nodules with ground-glass halos scattered throughout both lungs, generally peribronchovascular in distribution, mildly increased in size since 11/07/2016. Atypical pneumonia such as fungal pneumonia remains the leading consideration in this patient reportedly on immunosuppressive therapy. Pulmonary vasculitis (GPA) may also be considered. Metastatic disease remains less likely although not entirely excluded. Continued post therapy chest CT follow-up advised. 2. Aortic atherosclerosis.  Preoperative labs noted. Serum pregnancy test was negative.  If no acute changes then I would anticipate that she could proceed as planned.  George Hugh [facility] Short Stay Center/Anesthesiology Phone [phone] 12/14/2016 5:45 PM

## 2016-12-15 ENCOUNTER — Ambulatory Visit (HOSPITAL_COMMUNITY): Payer: BC Managed Care – PPO

## 2016-12-15 ENCOUNTER — Ambulatory Visit (HOSPITAL_COMMUNITY)
Admission: RE | Admit: 2016-12-15 | Discharge: 2016-12-15 | Disposition: A | Discharge status: Home / Self Care | Payer: BC Managed Care – PPO | Source: Ambulatory Visit | Attending: Emergency Medicine | Admitting: Emergency Medicine

## 2016-12-15 ENCOUNTER — Encounter (HOSPITAL_COMMUNITY): Payer: Self-pay

## 2016-12-15 ENCOUNTER — Ambulatory Visit (HOSPITAL_COMMUNITY): Payer: BC Managed Care – PPO | Admitting: Certified Registered Nurse Anesthetist

## 2016-12-15 ENCOUNTER — Encounter (HOSPITAL_COMMUNITY)
Admission: RE | Disposition: A | Discharge status: Home / Self Care | Payer: Self-pay | Source: Ambulatory Visit | Attending: Emergency Medicine

## 2016-12-15 ENCOUNTER — Ambulatory Visit (HOSPITAL_COMMUNITY): Payer: BC Managed Care – PPO | Admitting: Vascular Surgery

## 2016-12-15 DIAGNOSIS — I10 Essential (primary) hypertension: Secondary | ICD-10-CM | POA: Insufficient documentation

## 2016-12-15 DIAGNOSIS — K5190 Ulcerative colitis, unspecified, without complications: Secondary | ICD-10-CM | POA: Insufficient documentation

## 2016-12-15 DIAGNOSIS — R918 Other nonspecific abnormal finding of lung field: Secondary | ICD-10-CM | POA: Diagnosis present

## 2016-12-15 DIAGNOSIS — Z79899 Other long term (current) drug therapy: Secondary | ICD-10-CM | POA: Insufficient documentation

## 2016-12-15 DIAGNOSIS — Z9889 Other specified postprocedural states: Secondary | ICD-10-CM

## 2016-12-15 DIAGNOSIS — R091 Pleurisy: Secondary | ICD-10-CM | POA: Insufficient documentation

## 2016-12-15 DIAGNOSIS — Z419 Encounter for procedure for purposes other than remedying health state, unspecified: Secondary | ICD-10-CM

## 2016-12-15 DIAGNOSIS — Z881 Allergy status to other antibiotic agents status: Secondary | ICD-10-CM | POA: Insufficient documentation

## 2016-12-15 HISTORY — PX: VIDEO BRONCHOSCOPY WITH ENDOBRONCHIAL NAVIGATION: SHX6175

## 2016-12-15 LAB — BODY FLUID CELL COUNT WITH DIFFERENTIAL
Eos, Fluid: 3 %
Lymphs, Fluid: 50 %
MONOCYTE-MACROPHAGE-SEROUS FLUID: 4 % — AB (ref 50–90)
Neutrophil Count, Fluid: 43 % — ABNORMAL HIGH (ref 0–25)
WBC FLUID: 84 uL (ref 0–1000)

## 2016-12-15 LAB — MVISTA BLASTOMYCES QNT AG, URINE

## 2016-12-15 SURGERY — VIDEO BRONCHOSCOPY WITH ENDOBRONCHIAL NAVIGATION
Anesthesia: General

## 2016-12-15 MED ORDER — PROPOFOL 10 MG/ML IV BOLUS
INTRAVENOUS | Status: AC
  Filled 2016-12-15: qty 20

## 2016-12-15 MED ORDER — DEXAMETHASONE SODIUM PHOSPHATE 4 MG/ML IJ SOLN
INTRAMUSCULAR | Status: DC | PRN
  Administered 2016-12-15: 4 mg via INTRAVENOUS

## 2016-12-15 MED ORDER — ONDANSETRON HCL 4 MG/2ML IJ SOLN
INTRAMUSCULAR | Status: AC
  Filled 2016-12-15: qty 2

## 2016-12-15 MED ORDER — FENTANYL CITRATE (PF) 250 MCG/5ML IJ SOLN
INTRAMUSCULAR | Status: AC
  Filled 2016-12-15: qty 5

## 2016-12-15 MED ORDER — PHENYLEPHRINE HCL 10 MG/ML IJ SOLN
INTRAMUSCULAR | Status: DC | PRN
  Administered 2016-12-15: 25 ug/min via INTRAVENOUS

## 2016-12-15 MED ORDER — NEOSTIGMINE METHYLSULFATE 10 MG/10ML IV SOLN
INTRAVENOUS | Status: DC | PRN
  Administered 2016-12-15: 3 mg via INTRAVENOUS

## 2016-12-15 MED ORDER — MIDAZOLAM HCL 2 MG/2ML IJ SOLN
INTRAMUSCULAR | Status: AC
  Filled 2016-12-15: qty 2

## 2016-12-15 MED ORDER — LIDOCAINE HCL (CARDIAC) 20 MG/ML IV SOLN
INTRAVENOUS | Status: DC | PRN
  Administered 2016-12-15: 60 mg via INTRAVENOUS
  Administered 2016-12-15: 40 mg via INTRAVENOUS

## 2016-12-15 MED ORDER — ROCURONIUM BROMIDE 100 MG/10ML IV SOLN
INTRAVENOUS | Status: DC | PRN
  Administered 2016-12-15: 40 mg via INTRAVENOUS

## 2016-12-15 MED ORDER — LACTATED RINGERS IV SOLN: INTRAVENOUS | Status: DC | PRN

## 2016-12-15 MED ORDER — DEXAMETHASONE SODIUM PHOSPHATE 10 MG/ML IJ SOLN
INTRAMUSCULAR | Status: AC
  Filled 2016-12-15: qty 1

## 2016-12-15 MED ORDER — FENTANYL CITRATE (PF) 100 MCG/2ML IJ SOLN
INTRAMUSCULAR | Status: DC | PRN
  Administered 2016-12-15: 25 ug via INTRAVENOUS
  Administered 2016-12-15: 100 ug via INTRAVENOUS

## 2016-12-15 MED ORDER — PROPOFOL 10 MG/ML IV BOLUS
INTRAVENOUS | Status: DC | PRN
  Administered 2016-12-15: 150 mg via INTRAVENOUS

## 2016-12-15 MED ORDER — GLYCOPYRROLATE 0.2 MG/ML IJ SOLN
INTRAMUSCULAR | Status: DC | PRN
  Administered 2016-12-15: 0.4 mg via INTRAVENOUS

## 2016-12-15 MED ORDER — ROCURONIUM BROMIDE 50 MG/5ML IV SOSY
PREFILLED_SYRINGE | INTRAVENOUS | Status: AC
  Filled 2016-12-15: qty 5

## 2016-12-15 MED ORDER — 0.9 % SODIUM CHLORIDE (POUR BTL) OPTIME
TOPICAL | Status: DC | PRN
  Administered 2016-12-15: 1000 mL

## 2016-12-15 MED ORDER — LIDOCAINE 2% (20 MG/ML) 5 ML SYRINGE
INTRAMUSCULAR | Status: AC
  Filled 2016-12-15: qty 5

## 2016-12-15 MED ORDER — MIDAZOLAM HCL 5 MG/5ML IJ SOLN
INTRAMUSCULAR | Status: DC | PRN
  Administered 2016-12-15: 2 mg via INTRAVENOUS

## 2016-12-15 MED ORDER — ONDANSETRON HCL 4 MG/2ML IJ SOLN
INTRAMUSCULAR | Status: DC | PRN
  Administered 2016-12-15: 4 mg via INTRAVENOUS

## 2016-12-15 MED ORDER — LACTATED RINGERS IV SOLN
INTRAVENOUS | Status: DC | PRN
Start: 2016-12-15 — End: 2016-12-15
  Administered 2016-12-15: 09:00:00 via INTRAVENOUS

## 2016-12-15 SURGICAL SUPPLY — 40 items
ADAPTER BRONCH F/PENTAX (ADAPTER) ×3 IMPLANT
BRUSH CYTOL CELLEBRITY 1.5X140 (MISCELLANEOUS) ×3 IMPLANT
BRUSH SUPERTRAX BIOPSY (INSTRUMENTS) IMPLANT
BRUSH SUPERTRAX NDL-TIP CYTO (INSTRUMENTS) ×9 IMPLANT
CANISTER SUCT 3000ML PPV (MISCELLANEOUS) ×3 IMPLANT
CHANNEL WORK EXTEND EDGE 180 (KITS) IMPLANT
CHANNEL WORK EXTEND EDGE 45 (KITS) IMPLANT
CHANNEL WORK EXTEND EDGE 90 (KITS) IMPLANT
CONT SPEC 4OZ CLIKSEAL STRL BL (MISCELLANEOUS) ×6 IMPLANT
COVER BACK TABLE 60X90IN (DRAPES) ×3 IMPLANT
FILTER STRAW FLUID ASPIR (MISCELLANEOUS) IMPLANT
FORCEPS BIOP SUPERTRX PREMAR (INSTRUMENTS) ×3 IMPLANT
GAUZE SPONGE 4X4 12PLY STRL (GAUZE/BANDAGES/DRESSINGS) ×3 IMPLANT
GLOVE BIO SURGEON STRL SZ 6.5 (GLOVE) ×2 IMPLANT
GLOVE BIO SURGEON STRL SZ7.5 (GLOVE) ×6 IMPLANT
GLOVE BIO SURGEONS STRL SZ 6.5 (GLOVE) ×1
GOWN STRL REUS W/ TWL LRG LVL3 (GOWN DISPOSABLE) ×2 IMPLANT
GOWN STRL REUS W/TWL LRG LVL3 (GOWN DISPOSABLE) ×4
KIT CLEAN ENDO COMPLIANCE (KITS) ×3 IMPLANT
KIT LOCATABLE GUIDE (CANNULA) IMPLANT
KIT MARKER FIDUCIAL DELIVERY (KITS) IMPLANT
KIT PROCEDURE EDGE 180 (KITS) ×3 IMPLANT
KIT PROCEDURE EDGE 45 (KITS) IMPLANT
KIT PROCEDURE EDGE 90 (KITS) IMPLANT
KIT ROOM TURNOVER OR (KITS) ×3 IMPLANT
MARKER SKIN DUAL TIP RULER LAB (MISCELLANEOUS) ×3 IMPLANT
NEEDLE SUPERTRX PREMARK BIOPSY (NEEDLE) ×6 IMPLANT
NS IRRIG 1000ML POUR BTL (IV SOLUTION) ×3 IMPLANT
OIL SILICONE PENTAX (PARTS (SERVICE/REPAIRS)) ×3 IMPLANT
PAD ARMBOARD 7.5X6 YLW CONV (MISCELLANEOUS) ×6 IMPLANT
PATCHES PATIENT (LABEL) ×3 IMPLANT
SYR 20CC LL (SYRINGE) ×3 IMPLANT
SYR 20ML ECCENTRIC (SYRINGE) ×3 IMPLANT
SYR 50ML SLIP (SYRINGE) ×3 IMPLANT
TOWEL OR 17X24 6PK STRL BLUE (TOWEL DISPOSABLE) ×3 IMPLANT
TRAP SPECIMEN MUCOUS 40CC (MISCELLANEOUS) IMPLANT
TUBE CONNECTING 20'X1/4 (TUBING) ×1
TUBE CONNECTING 20X1/4 (TUBING) ×2 IMPLANT
UNDERPAD 30X30 (UNDERPADS AND DIAPERS) ×3 IMPLANT
WATER STERILE IRR 1000ML POUR (IV SOLUTION) ×3 IMPLANT

## 2016-12-15 NOTE — Interval H&P Note (Signed)
PCCM Interval Note  Gabrielle Wilkins 41 yo woman with UC, has been on immunosuppression w vedolixumab (off for 2 months) being followed for multiple enlarging pulm nodules. She underwent non-dx FOB 3/12, has been followed subsequently by Dr Ashok Cordia. Note travel to [place]. No new complaints or new issues.   FOB PCP, BAL, AFB are all negative Serum Fungitell, Histo Ag, aspergillus Ag negative.  Blasto Ag is pending ANCA negative  Vitals:   12/15/16 0903  BP: (!) 125/93  Pulse: 79  Resp: 18  Temp: 98.7 F (37.1 C)  TempSrc: Oral  SpO2: 100%  Weight: 67.1 kg (148 lb)  Height: _0  (1.575 m)   Gen: Gabrielle Wilkins, well-nourished, in no distress,  normal affect  ENT: No lesions,  mouth clear,  oropharynx clear, no postnasal drip  Neck: No JVD, no stridor  Lungs: No use of accessory muscles, clear without rales or rhonchi  Cardiovascular: RRR, heart sounds normal, no murmur or gallops, no peripheral edema  Musculoskeletal: No deformities, no cyanosis or clubbing  Neuro: alert, non focal  Skin: Warm, no lesions or rashes   Impression:  B pulmonary nodules in pt with hx [place], also has been on biologic therapy. DDx includes auto-immune process, opportunistic infection.   Plan:  Navigational FOB for tissue sampling, cx data. Procedure explained to pt and her husband. All questions answered about benefits and risks. No barriers. Pt elects to proceed.   Baltazar Apo, MD, PhD 12/15/2016, 10:16 AM [facility] [phone] or if no answer [phone]

## 2016-12-15 NOTE — Discharge Instructions (Signed)
Flexible Bronchoscopy, Care After These instructions give you information on caring for yourself after your procedure. Your doctor may also give you more specific instructions. Call your doctor if you have any problems or questions after your procedure. Follow these instructions at home:  Do not eat or drink anything for 2 hours after your procedure. If you try to eat or drink before the medicine wears off, food or drink could go into your lungs. You could also burn yourself.  After 2 hours have passed and when you can cough and gag normally, you may eat soft food and drink liquids slowly.  The day after the test, you may eat your normal diet.  You may do your normal activities.  Keep all doctor visits. Get help right away if:  You get more and more short of breath.  You get light-headed.  You feel like you are going to pass out (faint).  You have chest pain.  You have new problems that worry you.  You cough up more than a little blood.  You cough up more blood than before. This information is not intended to replace advice given to you by your health care provider. Make sure you discuss any questions you have with your health care provider. Document Released: 06/13/2009 Document Revised: 01/22/2016 Document Reviewed: 04/20/2013 Elsevier Interactive Patient Education  2017 [place] FOR ANY QUESTIONS OR CONCERNS. [phone].

## 2016-12-15 NOTE — Anesthesia Procedure Notes (Signed)
Procedure Name: Intubation Date/Time: 12/15/2016 10:24 AM Performed by: Rush Farmer E Pre-anesthesia Checklist: Patient identified, Emergency Drugs available, Suction available and Patient being monitored Patient Re-evaluated:Patient Re-evaluated prior to inductionOxygen Delivery Method: Circle system utilized Preoxygenation: Pre-oxygenation with 100% oxygen Intubation Type: IV induction Ventilation: Mask ventilation without difficulty Laryngoscope Size: Mac and 3 Grade View: Grade II Tube type: Oral Tube size: 8.5 mm Number of attempts: 1 Airway Equipment and Method: Stylet Placement Confirmation: ETT inserted through vocal cords under direct vision,  positive ETCO2 and breath sounds checked- equal and bilateral Secured at: 20 cm Tube secured with: Tape Dental Injury: Teeth and Oropharynx as per pre-operative assessment

## 2016-12-15 NOTE — H&P (View-Only) (Signed)
 Subjective:    Patient ID: Gabrielle Wilkins, female    DOB: 02/21/1976, 40 y.o.   MRN: [id number]  C.C.:  Follow-up for Multiple Lung Nodules on CT Imaging & Pleurisy.   HPI Multiple Lung Nodules on CT Imaging: Patient was previously hospitalized and underwent bronchoscopy with lavage on 11/08/16. Cultures remain negative although the nodules appear to be enlarging in size. She reports she has continued dyspnea on exertion. She has to stop after 3 flights of stairs. Her dyspnea has been evident since she left the hospital. She denies any cough but previously did have a nonproductive cough. Her cough has essentially resolved over the last week. Has known recent travel to [place] but no sick contacts. She has been off of Entyvio for 6 weeks. Previously had 2 doses (1st dose 2/16 & 2nd 3/2).  Pleurisy:  She reports her pain has improved since her discharge. She still doe have some discomfort with deep inspiration.   Review of Systems She has had night sweats. No fever or chills. No sinus congestion, drainage, or epistaxis. She reports her previously known rash has been improving. No new bruising.   Allergies  Allergen Reactions  . Ceclor [Cefaclor] Hives    Patient said she can tolerate penicillin.  Hasn't had Keflex.     Current Outpatient Prescriptions on File Prior to Visit  Medication Sig Dispense Refill  . lisinopril-hydrochlorothiazide (PRINZIDE,ZESTORETIC) 10-12.5 MG per tablet Take 1 tablet by mouth daily.     . omeprazole (PRILOSEC) 40 MG capsule Take 40 mg by mouth daily.    . HYDROcodone-acetaminophen (NORCO/VICODIN) 5-325 MG tablet Take 1-2 tablets by mouth every 4 (four) hours as needed for moderate pain. (Patient not taking: Reported on 12/09/2016) 30 tablet 0  . traMADol (ULTRAM) 50 MG tablet Take 1 tablet (50 mg total) by mouth every 6 (six) hours as needed for moderate pain. (Patient not taking: Reported on 12/09/2016) 30 tablet 0  . vedolizumab (ENTYVIO) 300 MG  injection Inject 300 mg into the vein every 14 (fourteen) days.     No current facility-administered medications on file prior to visit.     Past Medical History:  Diagnosis Date  . Depression   . Hypertension    Chronic Hypertension  . Migraine, ophthalmoplegic   . Pregnancy induced hypertension   . Ulcerative colitis   . Ulcerative colitis (HCC)     Past Surgical History:  Procedure Laterality Date  . CESAREAN SECTION  04/05/2011   Procedure: CESAREAN SECTION;  Surgeon: Kathy W Richardson;  Location: [facility];  Service: Gynecology;  Laterality: N/A;  baby boy 0135  APGAR 7/9  . CHOLECYSTECTOMY  2008  . COLONOSCOPY N/A   . DILATION AND CURETTAGE OF UTERUS    . VIDEO BRONCHOSCOPY Bilateral 11/08/2016   Procedure: VIDEO BRONCHOSCOPY WITHOUT FLUORO;  Surgeon: Wesam G Yacoub, MD;  Location: [facility];  Service: Cardiopulmonary;  Laterality: Bilateral;    Family History  Problem Relation Age of Onset  . Pancreatic cancer Mother   . Hypertension Mother   . Heart attack Father   . Alcoholism Father   . Cirrhosis Father   . Breast cancer Father   . Hypertension Brother   . Arthritis/Rheumatoid Brother   . Hypertension Brother   . HIV Brother   . Bipolar disorder Brother   . Hypertension Brother   . Heart attack Other 28    Niece  . Breast cancer Maternal Grandmother   . Breast cancer Paternal Grandmother       Social History   Social History  . Marital status: Married    Spouse name: N/A  . Number of children: N/A  . Years of education: N/A   Social History Main Topics  . Smoking status: Passive Smoke Exposure - Never Smoker  . Smokeless tobacco: Never Used     Comment: Father as a child  . Alcohol use 1.8 oz/week    3 Standard drinks or equivalent per week  . Drug use: No  . Sexual activity: Yes   Other Topics Concern  . None   Social History Narrative   [facility] (12/09/16):   Originally from [place]. She moved to [place] in 1998. 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 No known mold exposure. Does have an outdoor cat & an indoor bearded dragon. No bird exposure. No hot tub exposure. Does have a couple of indoor plants. Enjoys cooking. Married with 3 kids.       Objective:   Physical Exam BP 140/90 (BP Location: Right Arm, Patient Position: Sitting, Cuff Size: Normal)   Pulse 73   Ht 5' 2" (1.575 m)   Wt 146 lb 12.8 oz (66.6 kg)   SpO2 100%   BMI 26.85 kg/m  General:  Awake. Alert. No acute distress. Husband with patient today. Integument:  Warm & dry. No rash on exposed skin. No bruising on exposed skin. Extremities:  No cyanosis or clubbing.  Lymphatics: No appreciated cervical or supraclavicular lymphadenopathy. HEENT:  Moist mucus membranes. No oral ulcers. No scleral injection or icterus. Mallampati class I. Cardiovascular:  Regular rate. No edema. Normal S1 & S2.  Pulmonary:  Good aeration & clear to auscultation bilaterally. Symmetric chest wall expansion. No accessory muscle use on room air. Abdomen: Soft. Normal bowel sounds. Nondistended.  Musculoskeletal:  Normal bulk and tone. No joint deformity or effusion appreciated.  IMAGING: CT CHEST W/O 11/29/16 (personally reviewed by me): Bilateral lung nodules that are lower lobe predominant. Some evidence of enlargement. Solid and groundglass component to the nodules. Right middle lobe lateral segment nodule appears to have airway running right through it. No pleural effusion or thickening. No pericardial effusion. No pathologic mediastinal adenopathy.  MICROBIOLOGY: Urine streptococcal antigen (11/07/16):  Negative  Blood Cultures x2 (11/07/16):  Negative Blood fungal culture (11/07/16):  Negative  QuantiFERON-TB (11/07/16): Negative HIV (11/07/16): Nonreactive BAL (11/08/16):  Normal respiratory flora / PCP negative / AFB pending  Urine Histoplasma antigen (11/10/16): Negative Sputum culture (11/10/16):  Negative   PATHOLOGY: BAL RML & RLL (11/08/16):   Negative for malignancy.     Assessment & Plan:  40 y.o. female on immunosuppression with monoclonal antibody for ulcerative colitis presenting with bilateral lower lobe predominant nodules with some groundglass. Nodule within lateral segment of right middle lobe appears to have an airway going through it. This would be an excellent navigational bronchoscopy case I feel. I am concerned about the possibility of an atypical infection from Blastomyces given her previous travel, and immunosuppression. Certainly malignancy must be considered given her immunosuppression but I feel this is less likely. An autoimmune process is also possible but again slightly less likely. I discussed the risks and benefits of bronchoscopy with endotracheal intubation and general anesthesia including bleeding, infection, pneumothorax, and potentially death. The patient is willing to undergo the procedure knowing these risks.  1. Multiple lung nodules: Checking urine Blastomyces antigen. Checking serum beta D glucan, Aspergillus antigen, and ANCA panel.  Plan for navigational bronchoscopy next week after I review the case   with Dr. Byrum. 2. Pleurisy: Likely secondary to her poor peripheral lesions. Improving. No new medications. 3. Follow-up: Patient to return to clinic in 2 weeks to review the results of her procedure.  Jennings E. Nestor, M.D. [facility] Pager:  [phone] After 3pm or if no response, call [phone] 12:39 PM 12/09/16    

## 2016-12-15 NOTE — Anesthesia Preprocedure Evaluation (Addendum)
Anesthesia Evaluation  Patient identified by MRN, date of birth, ID band Patient awake    Reviewed: Allergy & Precautions, NPO status , Patient's Chart, lab work & pertinent test results  Airway Mallampati: I       Dental  (+) Teeth Intact   Pulmonary    breath sounds clear to auscultation       Cardiovascular hypertension,  Rhythm:Regular Rate:Normal     Neuro/Psych    GI/Hepatic   Endo/Other    Renal/GU      Musculoskeletal   Abdominal   Peds  Hematology   Anesthesia Other Findings   Reproductive/Obstetrics                            Anesthesia Physical Anesthesia Plan  ASA: II  Anesthesia Plan: General   Post-op Pain Management:    Induction: Intravenous  Airway Management Planned: Oral ETT  Additional Equipment:   Intra-op Plan:   Post-operative Plan: Extubation in OR  Informed Consent: I have reviewed the patients History and Physical, chart, labs and discussed the procedure including the risks, benefits and alternatives for the proposed anesthesia with the patient or authorized representative who has indicated his/her understanding and acceptance.     Plan Discussed with: CRNA and Anesthesiologist  Anesthesia Plan Comments:         Anesthesia Quick Evaluation

## 2016-12-15 NOTE — Anesthesia Postprocedure Evaluation (Addendum)
Anesthesia Post Note  Patient: Gabrielle Wilkins  Procedure(s) Performed: Procedure(s) (LRB): VIDEO BRONCHOSCOPY WITH ENDOBRONCHIAL NAVIGATION (N/A)  Patient location during evaluation: PACU Anesthesia Type: General Level of consciousness: awake and lethargic Pain management: pain level controlled Vital Signs Assessment: post-procedure vital signs reviewed and stable Respiratory status: spontaneous breathing, nonlabored ventilation, respiratory function stable and patient connected to nasal cannula oxygen Cardiovascular status: blood pressure returned to baseline and stable Postop Assessment: no signs of nausea or vomiting Anesthetic complications: no       Last Vitals:  Vitals:   12/15/16 1230 12/15/16 1245  BP: 120/80 106/74  Pulse: 71 76  Resp: 15 18  Temp:  37 C    Last Pain:  Vitals:   12/15/16 0903  TempSrc: Oral                 Darien Kading,JAMES TERRILL

## 2016-12-15 NOTE — Op Note (Signed)
Video Bronchoscopy with Electromagnetic Navigation Procedure Note  Date of Operation: 12/15/2016  Pre-op Diagnosis: B pulmonary nodules  Post-op Diagnosis: same  Surgeon: Baltazar Apo  Assistants: none  Anesthesia: General endotracheal anesthesia  Operation: Flexible video fiberoptic bronchoscopy with electromagnetic navigation and biopsies.  Estimated Blood Loss: Minimal  Complications: none apparent   Indications and History: Gabrielle Wilkins is a 41 y.o. female with hx ulcerative colitis and immunosuppression. She has pulmonary nodules of unclear etiology. Recommendation made to pursue tissue dx via navigational bronchoscopy.  The risks, benefits, complications, treatment options and expected outcomes were discussed with the patient.  The possibilities of pneumothorax, pneumonia, reaction to medication, pulmonary aspiration, perforation of a viscus, bleeding, failure to diagnose a condition and creating a complication requiring transfusion or operation were discussed with the patient who freely signed the consent.    Description of Procedure: The patient was seen in the Preoperative Area, was examined and was deemed appropriate to proceed.  The patient was taken to OR 11, identified as Gabrielle Wilkins and the procedure verified as Flexible Video Fiberoptic Bronchoscopy.  A Time Out was held and the above information confirmed.   Prior to the date of the procedure a high-resolution CT scan of the chest was performed. Utilizing [place] a virtual tracheobronchial tree was generated to allow the creation of distinct navigation pathways to the patient's parenchymal abnormalities. After being taken to the operating room general anesthesia was initiated and the patient  was orally intubated. The video fiberoptic bronchoscope was introduced via the endotracheal tube and a general inspection was performed which showed normal airways with exception of some prominent airway vasculature,  especially in the L mainstem and into the LLL airway. There was an extra segmental airway in the RML. Otherwise no abnormalities. No endobronchial lesions. The extendable working channel and locator guide were introduced into the bronchoscope. The distinct navigation pathways prepared prior to this procedure were then utilized to navigate to within 0.5-0.9 cm of patient's lesions identified on CT scan. Adequate targeting achieved at target 1 (RML), target 2 (RLL) and target 5 (LLL). The extendable working channel was secured into place at each location and the locator guide was withdrawn. Under fluoroscopic guidance transbronchial needle brushings, transbronchial Wang needle biopsies, and transbronchial forceps biopsies were performed to be sent for cytology and pathology at target 1. Clear slides from brushings were also made from this location for AFB and fungal staining. A bronchioalveolar lavage was performed in the RML and sent for cytology, cell count and microbiology (bacterial, fungal, AFB smears and cultures, PCP DFA). Transbronchial needle brushings and BAL then were performed at targets 2 and 5 respectively.   At the end of the procedure a general airway inspection was performed and there was no evidence of active bleeding. The bronchoscope was removed.  The patient tolerated the procedure well. There was no significant blood loss and there were no obvious complications. A post-procedural chest x-ray is pending.  Samples: 1. Transbronchial needle brushings from target 1 (RML), target 2 (RLL), target 5 (LLL) 2. Transbronchial Wang needle biopsies from target 1 (RML) 3. Transbronchial forceps biopsies from target 1 (RML) 4. Bronchoalveolar lavage from RML (t1), RLL(t2), LLL (t5)  Plans:  The patient will be discharged from the PACU to home when recovered from anesthesia and after chest x-ray is reviewed. We will review the cytology, pathology and microbiology results with the patient when they  become available. Outpatient followup will be with Dr Ashok Cordia or Dr Lamonte Sakai.  Baltazar Apo, MD, PhD 12/15/2016, 11:52 AM [facility] [phone] or if no answer [phone]

## 2016-12-15 NOTE — Transfer of Care (Signed)
Immediate Anesthesia Transfer of Care Note  Patient: Gabrielle Wilkins  Procedure(s) Performed: Procedure(s): VIDEO BRONCHOSCOPY WITH ENDOBRONCHIAL NAVIGATION (N/A)  Patient Location: PACU  Anesthesia Type:General  Level of Consciousness: awake, alert  and oriented  Airway & Oxygen Therapy: Patient Spontanous Breathing and Patient connected to nasal cannula oxygen  Post-op Assessment: Report given to RN, Post -op Vital signs reviewed and stable and Patient moving all extremities X 4  Post vital signs: Reviewed and stable  Last Vitals:  Vitals:   12/15/16 0903 12/15/16 1157  BP: (!) 125/93   Pulse: 79   Resp: 18   Temp: 37.1 C (P) 36.3 C    Last Pain:  Vitals:   12/15/16 0903  TempSrc: Oral      Patients Stated Pain Goal: 6 (65/03/54 6568)  Complications: No apparent anesthesia complications

## 2016-12-16 ENCOUNTER — Encounter (HOSPITAL_COMMUNITY): Payer: Self-pay | Admitting: Emergency Medicine

## 2016-12-16 LAB — ACID FAST SMEAR (AFB, MYCOBACTERIA)
Acid Fast Smear: NEGATIVE
Acid Fast Smear: NEGATIVE
Acid Fast Smear: NEGATIVE

## 2016-12-16 LAB — ACID FAST SMEAR (AFB): ACID FAST SMEAR - AFSCU2: NEGATIVE

## 2016-12-16 NOTE — Telephone Encounter (Signed)
[phone] from [place]

## 2016-12-17 ENCOUNTER — Telehealth: Payer: Self-pay | Admitting: Internal Medicine

## 2016-12-17 ENCOUNTER — Telehealth: Payer: Self-pay | Admitting: Pulmonary Disease

## 2016-12-17 ENCOUNTER — Ambulatory Visit (INDEPENDENT_AMBULATORY_CARE_PROVIDER_SITE_OTHER)
Admission: RE | Admit: 2016-12-17 | Discharge: 2016-12-17 | Disposition: A | Discharge status: Home / Self Care | Payer: BC Managed Care – PPO | Source: Ambulatory Visit | Attending: Internal Medicine | Admitting: Internal Medicine

## 2016-12-17 DIAGNOSIS — R918 Other nonspecific abnormal finding of lung field: Secondary | ICD-10-CM

## 2016-12-17 DIAGNOSIS — R0781 Pleurodynia: Secondary | ICD-10-CM | POA: Diagnosis not present

## 2016-12-17 MED ORDER — PREDNISONE 10 MG PO TABS: ORAL_TABLET | ORAL | 0 refills | Status: DC

## 2016-12-17 NOTE — Telephone Encounter (Signed)
MR no appts this afternoon for the pt to come in and be seen.  Do you want her to come in for just the cxr?

## 2016-12-17 NOTE — Telephone Encounter (Signed)
lmomtcb x 1 for the pt 

## 2016-12-17 NOTE — Telephone Encounter (Signed)
Pt aware of rec's per Dr Chase Caller.  Prednisone sent to pharmacy and patient aware to start this if symptoms increase.  Nothing further needed.

## 2016-12-17 NOTE — Telephone Encounter (Signed)
Pt returning call.Gabrielle Wilkins ° °

## 2016-12-17 NOTE — Telephone Encounter (Signed)
Pt states that since 4/18 bronch, pt has audible exhalation sounds, sounding like "crackles" worsening since yesterday afternoon.  Pt also notes prod cough with clear mucus tinged with brown/red flecks of "old blood".  Denies any chest pain, fever, just wants to make sure that these audible exhalations are normal and not concerning.    Pt uses [facility] on [address] to DOD as both [facility] and [facility] are unavailable.  MR please advise.  Thanks!

## 2016-12-17 NOTE — Telephone Encounter (Signed)
Just have her do cxr stat  Dr. Brand Males, M.D., [facility].C.P Pulmonary and Critical Care Medicine Staff Physician [facility] Pager: [phone], If no answer or between  15:00h - 7:00h: call [phone]  12/17/2016 2:25 PM

## 2016-12-17 NOTE — Telephone Encounter (Signed)
Should come in and do a cxr and see an APP this PM if possible. Or go to ER If cxr ok and wheeze etc. Is an issue APP to consider stopping lisinopril   Dr. Brand Males, M.D., [facility].C.P Pulmonary and Critical Care Medicine Staff Physician [facility] Pager: [phone], If no answer or between  15:00h - 7:00h: call [phone]  12/17/2016 11:11 AM

## 2016-12-17 NOTE — Telephone Encounter (Signed)
Pt aware of recs.  Will get cxr this afternoon.  Nothing further needed at this time.

## 2016-12-17 NOTE — Telephone Encounter (Signed)
  No complication after bronch  Plan She should stop lisinopril -makes people wheeze Any worsening go to ER Can call in pred to take if wheeze  Is over weekend - she can have it as backup  - Please take prednisone 40 mg x1 day, then 30 mg x1 day, then 20 mg x1 day, then 10 mg x1 day, and then 5 mg x1 day and stop    Dr. Brand Males, M.D., [facility].C.P Pulmonary and Critical Care Medicine Staff Physician [facility] Pager: [phone], If no answer or between  15:00h - 7:00h: call [phone]  12/17/2016 3:37 PM     Dg Chest 2 View  Result Date: 12/17/2016 CLINICAL DATA:  41 year old female with ulcerative colitis, abnormal pulmonary nodules in March and earlier this month with differential diagnosis of fungal infection, pulmonary vasculitis. Status post bronchoscopy on 12/15/2016. EXAM: CHEST  2 VIEW COMPARISON:  [facility] chest radiograph 12/15/2016, chest CT 11/29/2016 and earlier. FINDINGS: Decreased confluence of opacity at the right lung base since 12/15/2016 which may have been related to bronchial alveolar lavage changes. A 3.4 cm cavitary lesion has developed in the right middle lobe since March. However, other basilar predominant pulmonary nodular changes show mild regression since that time. Mediastinal contours are normal. Visualized tracheal air column is within normal limits. No pneumothorax. No pleural effusion or pulmonary edema. No areas of worsening ventilation. Stable cholecystectomy clips. No acute osseous abnormality identified. Negative visible bowel gas pattern. IMPRESSION: 1. Regressed confluent opacity at the right lung base since 12/15/2016 which may have been the sequelae of bronchial alveolar lavage. 2. A 3.4 cm right middle lobe cavitary lesion has developed since March, and probably evolved from largest right middle lobe nodule seen on 11/29/2016. Other basilar predominant nodular pulmonary changes have mildly regressed since  that time. 3. No new cardiopulmonary abnormality. Electronically Signed   By: Genevie Ann M.D.   On: 12/17/2016 15:09      Current Outpatient Prescriptions:  .  lisinopril-hydrochlorothiazide (PRINZIDE,ZESTORETIC) 10-12.5 MG per tablet, Take 1 tablet by mouth daily at 6 (six) AM. , Disp: , Rfl:  .  omeprazole (PRILOSEC) 40 MG capsule, Take 40 mg by mouth daily at 6 (six) AM. , Disp: , Rfl:

## 2016-12-20 ENCOUNTER — Telehealth: Payer: Self-pay | Admitting: Emergency Medicine

## 2016-12-20 LAB — PNEUMOCYSTIS JIROVECI SMEAR BY DFA: Pneumocystis jiroveci Ag: NEGATIVE

## 2016-12-20 NOTE — Telephone Encounter (Signed)
Please let the patient know that her pathology and cytology results from her bronchoscopy all show normal lung tissue.  Her culture reslts are all still pending. She will need to review with Dr Ashok Cordia at their follow up visits. Thanks

## 2016-12-20 NOTE — Telephone Encounter (Signed)
Spoke with pt, advised results. Pt understood and nothing further is needed.

## 2016-12-21 ENCOUNTER — Ambulatory Visit: Payer: BC Managed Care – PPO | Admitting: Pulmonary Disease

## 2016-12-21 LAB — FUNGUS STAIN

## 2016-12-21 LAB — ACID FAST CULTURE WITH REFLEXED SENSITIVITIES: ACID FAST CULTURE - AFSCU3: NEGATIVE

## 2016-12-21 LAB — FUNGAL STAIN REFLEX

## 2016-12-21 LAB — ACID FAST CULTURE WITH REFLEXED SENSITIVITIES (MYCOBACTERIA): Acid Fast Culture: NEGATIVE

## 2016-12-22 ENCOUNTER — Inpatient Hospital Stay: Payer: BC Managed Care – PPO | Admitting: Pulmonary Disease

## 2016-12-24 ENCOUNTER — Ambulatory Visit (INDEPENDENT_AMBULATORY_CARE_PROVIDER_SITE_OTHER): Payer: BC Managed Care – PPO | Admitting: Pulmonary Disease

## 2016-12-24 ENCOUNTER — Encounter: Payer: Self-pay | Admitting: Pulmonary Disease

## 2016-12-24 VITALS — BP 136/82 | HR 78 | Ht 62.0 in | Wt 147.4 lb

## 2016-12-24 DIAGNOSIS — R0609 Other forms of dyspnea: Secondary | ICD-10-CM

## 2016-12-24 DIAGNOSIS — R091 Pleurisy: Secondary | ICD-10-CM | POA: Diagnosis not present

## 2016-12-24 DIAGNOSIS — R918 Other nonspecific abnormal finding of lung field: Secondary | ICD-10-CM

## 2016-12-24 NOTE — Progress Notes (Signed)
Subjective:    Patient ID: Gabrielle Wilkins, female    DOB: 05/11/1976, 41 y.o.   MRN: [id number]  C.C.:  Follow-up for Dyspnea on Exertion, Multiple Lung Nodules on CT Imaging & Pleurisy.   HPI Dyspnea on exertion:  She reports her dyspnea has been relatively unchanged. She denies any associated coughing or wheezing.   Multiple lung nodules:Patient underwent bronchoscopy with culture and biopsy specimens. Nodules appear to be regressing on repeat imaging with formation of a cystic lesion within right middle lobe. Previously hospitalized and underwent bronchoscopy with lavage on 11/08/16. Previous travel to [facility] but no sick contacts. Remains off of Entyvio (1st dose 2/16 & last dose 3/2).  Pleurisy: She reports she has "occasional" pleuritic chest pain, primarily on the right. This is much less in intensity and frequency compared with her prior symptoms.   Review of Systems She reports her night sweat frequency is decreasing. No fever or chills. No rashes or bruising. No abdominal pain, nausea or emesis.   Allergies  Allergen Reactions  . Ceclor [Cefaclor] Hives    Patient said she can tolerate penicillin.  Hasn't had Keflex.     Current Outpatient Prescriptions on File Prior to Visit  Medication Sig Dispense Refill  . lisinopril-hydrochlorothiazide (PRINZIDE,ZESTORETIC) 10-12.5 MG per tablet Take 1 tablet by mouth daily at 6 (six) AM.     . omeprazole (PRILOSEC) 40 MG capsule Take 40 mg by mouth daily at 6 (six) AM.      No current facility-administered medications on file prior to visit.     Past Medical History:  Diagnosis Date  . Anemia   . Depression    Not for a very LONG time  . Dyspnea   . GERD (gastroesophageal reflux disease)    OCCASIONALLY  . Hx of inverse psoriasis   . Hypertension    Chronic Hypertension  . Migraine, ophthalmoplegic   . Pregnancy induced hypertension   . Ulcerative colitis   . Ulcerative colitis [facility])     Past Surgical  History:  Procedure Laterality Date  . CESAREAN SECTION  04/05/2011   Procedure: CESAREAN SECTION;  Surgeon: Logan Bores;  Location: [facility];  Service: Gynecology;  Laterality: N/A;  baby boy 66  APGAR 7/9  . CHOLECYSTECTOMY  2008  . COLONOSCOPY N/A   . DILATION AND CURETTAGE OF UTERUS    . VIDEO BRONCHOSCOPY Bilateral 11/08/2016   Procedure: VIDEO BRONCHOSCOPY WITHOUT FLUORO;  Surgeon: Rush Farmer, MD;  Location: [facility];  Service: Cardiopulmonary;  Laterality: Bilateral;  . VIDEO BRONCHOSCOPY WITH ENDOBRONCHIAL NAVIGATION N/A 12/15/2016   Procedure: VIDEO BRONCHOSCOPY WITH ENDOBRONCHIAL NAVIGATION;  Surgeon: Collene Gobble, MD;  Location: [facility];  Service: Thoracic;  Laterality: N/A;    Family History  Problem Relation Age of Onset  . Pancreatic cancer Mother   . Hypertension Mother   . Heart attack Father   . Alcoholism Father   . Cirrhosis Father   . Breast cancer Father   . Hypertension Brother   . Arthritis/Rheumatoid Brother   . Hypertension Brother   . HIV Brother   . Bipolar disorder Brother   . Hypertension Brother   . Heart attack Other 67    Niece  . Breast cancer Maternal Grandmother   . Breast cancer Paternal Grandmother     Social History   Social History  . Marital status: Married    Spouse name: N/A  . Number of children: N/A  . Years of education: N/A  Social History Main Topics  . Smoking status: Passive Smoke Exposure - Never Smoker  . Smokeless tobacco: Never Used     Comment: Father as a child  . Alcohol use 4.2 oz/week    3 Standard drinks or equivalent, 4 Glasses of wine per week  . Drug use: No  . Sexual activity: Yes   Other Topics Concern  . None   Social History Narrative   [facility] (12/09/16):   Originally from [place]. She moved to [place] in 1998. 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 No known mold exposure. Does have an outdoor cat & an indoor Geneticist, molecular. No bird exposure. No hot tub  exposure. Does have a couple of indoor plants. Enjoys cooking. Married with 3 kids.       Objective:   Physical Exam BP 136/82 (BP Location: Left Arm, Cuff Size: Normal)   Pulse 78   Ht _0  (1.575 m)   Wt 147 lb 6.4 oz (66.9 kg)   LMP 12/07/2016   SpO2 100%   BMI 26.96 kg/m   Gen.: No distress. Comfortable. Alert. Integument: No rash or bruising on exposed skin. Warm. Dry. HEENT: No scleral icterus. Moist mucous membranes. No nasal turbinate swelling. Pulmonary: Clear to auscultation bilaterally. No accessory muscle use on room air. Abdomen: Soft. Nondistended. Normal bowel sounds. Cardiovascular: Regular rate. Regular rhythm. No edema.  IMAGING: CXR PA/LAT 12/17/16 (personally reviewed by me): Progression in bilateral lung nodules. No pleural effusion appreciated. Patient does have a thin-walled cystic lesion measuring approximately 3 cm in maximal dimension within right middle lobe that certainly be consistent with resolving dominant right middle lobe nodule. No new nodule or opacity appreciated. Heart normal in size & mediastinum normal in contour.  CT CHEST W/O 11/29/16 (Previously reviewed by me): Bilateral lung nodules that are lower lobe predominant. Some evidence of enlargement. Solid and groundglass component to the nodules. Right middle lobe lateral segment nodule appears to have airway running right through it. No pleural effusion or thickening. No pericardial effusion. No pathologic mediastinal adenopathy.  MICROBIOLOGY: Urine streptococcal antigen (11/07/16):  Negative  Blood Cultures x2 (11/07/16):  Negative Blood fungal culture (11/07/16):  Negative  QuantiFERON-TB (11/07/16): Negative HIV (11/07/16): Nonreactive BAL (11/08/16):  Normal respiratory flora / PCP negative / AFB pending  Urine Histoplasma antigen (11/10/16): Negative Sputum culture (11/10/16):  Negative  Beta D glucan (12/09/16): Negative Aspergillus antigen (12/09/16): 0.08 Urine Blastomyces antigen (12/09/16):  Negative  Navigational Bronchoscopy (12/15/16): Right middle lobe brush AFB smear: Negative Right middle lobe brush fungus: Smear negative/culture pending Right middle lobe lavage AFB: Smear negative/culture pending Right middle lobe lavage fungus: Smear negative/culture pending Right middle lobe lavage PCP DFA: Negative Right lower lobe lavage AFB: Smear negative/culture pending Right lower lobe lavage fungus: Culture pending Left lower lobe lavage AFB: Smear negative/culture pending Left lower lobe lavage fungus: Culture pending  PATHOLOGY: BAL RML & RLL (11/08/16):  Negative for malignancy.   Navigational Bronchoscopy (12/15/16): Right middle lobe transbronchial biopsy: Negative for malignancy. Stains negative for fungus & AFB. Right middle lobe TBNA & brushing: Negative for malignancy. Left lower lobe brushing & lavage: Negative for malignancy  LABS BAL RML (12/15/16):  WBC 84 (50% lymph, 3% eos, 43% neutro, & 4% monocytes)  ANCA Screen (12/09/16):  Negative     Assessment & Plan:  41 y.o. female with multiple lung nodules and pleurisy. Patient is now status post bronchoscopy with navigational biopsy for pathology and culture. Patient does have development of  a thin-walled cystic lesion within right middle lobe likely representing her previous right middle lobe nodule. I reviewed her x-ray imaging with her today as well as her previous CT and x-ray imaging. We also discussed her bronchoscopy biopsy and culture results all of which have been negative thus far. I explained that fungal and atypical mycobacterial cultures are held for long periods of time to allow adequate chance for growth. Given the slight improvement seen on her x-ray imaging this is reassuring that the nodules are likely due to an infectious process and/or regressing as her immune system recovers. We did have a fairly frank discussion regarding the future risks with immunosuppression and the potential risks to her health. I  instructed the patient to notify me if her symptoms do not continue to improve or she had any new or worrisome symptoms.   1. Dyspnea on exertion: Unclear significance. Holding off on further testing. 2. Multiple lung nodules: Awaiting finalization of bronchoscopy cultures. Repeat CT chest without contrast. 4 weeks. 3. Pleurisy: Improving. No new medications. 4. Follow-up: Return to clinic in 4 weeks or sooner if needed after CT scan.  Sonia Baller Ashok Cordia, M.D. [facility] Pulmonary & Critical Care Pager:  [phone] After 3pm or if no response, call [phone] 5:03 PM 12/24/16

## 2016-12-24 NOTE — Patient Instructions (Signed)
   Call me if you feel your symptoms are returning or getting worse.  I will see you back in 4 weeks with a CT scan just prior to your appointment.  We will notify you if your cultures show any growth.  TESTS ORDERED: 1. CT CHEST W/O IN 4 WEEKS

## 2017-01-05 LAB — CULTURE, FUNGUS WITHOUT SMEAR

## 2017-01-12 LAB — FUNGUS CULTURE WITH STAIN

## 2017-01-12 LAB — FUNGUS CULTURE RESULT

## 2017-01-12 LAB — FUNGAL ORGANISM REFLEX

## 2017-01-25 ENCOUNTER — Inpatient Hospital Stay: Admission: RE | Admit: 2017-01-25 | Payer: BC Managed Care – PPO | Source: Ambulatory Visit

## 2017-01-27 ENCOUNTER — Ambulatory Visit (INDEPENDENT_AMBULATORY_CARE_PROVIDER_SITE_OTHER)
Admission: RE | Admit: 2017-01-27 | Discharge: 2017-01-27 | Disposition: A | Discharge status: Home / Self Care | Payer: BC Managed Care – PPO | Source: Ambulatory Visit | Attending: Pulmonary Disease | Admitting: Pulmonary Disease

## 2017-01-27 DIAGNOSIS — R918 Other nonspecific abnormal finding of lung field: Secondary | ICD-10-CM | POA: Diagnosis not present

## 2017-01-28 ENCOUNTER — Encounter: Payer: Self-pay | Admitting: Pulmonary Disease

## 2017-01-28 ENCOUNTER — Ambulatory Visit (INDEPENDENT_AMBULATORY_CARE_PROVIDER_SITE_OTHER): Payer: BC Managed Care – PPO | Admitting: Pulmonary Disease

## 2017-01-28 VITALS — BP 110/80 | HR 83 | Ht 62.0 in | Wt 149.2 lb

## 2017-01-28 DIAGNOSIS — R0609 Other forms of dyspnea: Secondary | ICD-10-CM | POA: Diagnosis not present

## 2017-01-28 DIAGNOSIS — R091 Pleurisy: Secondary | ICD-10-CM | POA: Diagnosis not present

## 2017-01-28 DIAGNOSIS — R918 Other nonspecific abnormal finding of lung field: Secondary | ICD-10-CM | POA: Diagnosis not present

## 2017-01-28 LAB — ACID FAST CULTURE WITH REFLEXED SENSITIVITIES (MYCOBACTERIA)
Acid Fast Culture: NEGATIVE
Acid Fast Culture: NEGATIVE
Acid Fast Culture: NEGATIVE

## 2017-01-28 LAB — ACID FAST CULTURE WITH REFLEXED SENSITIVITIES

## 2017-01-28 NOTE — Progress Notes (Signed)
Subjective:    Patient ID: Gabrielle Wilkins, female    DOB: 02-27-1976, 41 y.o.   MRN: [id number]  C.C.:  Follow-up for Dyspnea on Exertion, Multiple Lung Nodules on CT Imaging & Pleurisy.   HPI Dyspnea on exertion: Unclear etiology. Does still have some dyspnea on exertion that is improving.  Multiple lung nodules:  Previously hospitalized and underwent bronchoscopy with lavage on 11/08/16. Previous travel to [facility] but no sick contacts. Remains off of Entyvio (1st dose 2/16 & last dose 3/2). Cultures from navigational bronchoscopy in April were negative. Nodules are decreasing in size on my review of her chest CT scan from yesterday. No coughing.   Pleurisy: Previously reported only a "occasional" pleuritic chest pain with decreased intensity compared with previous symptoms. She reports she only had brief pleurisy in May that resolved and has not occurred since.  Review of Systems She reports still a slight lack of fatigue. She denies any fever, chills, or sweats. No abdominal pain or nausea. No rashes or bruising.   Allergies  Allergen Reactions  . Ceclor [Cefaclor] Hives    Patient said she can tolerate penicillin.  Hasn't had Keflex.     Current Outpatient Prescriptions on File Prior to Visit  Medication Sig Dispense Refill  . lisinopril-hydrochlorothiazide (PRINZIDE,ZESTORETIC) 10-12.5 MG per tablet Take 1 tablet by mouth daily at 6 (six) AM.     . omeprazole (PRILOSEC) 40 MG capsule Take 40 mg by mouth daily at 6 (six) AM.      No current facility-administered medications on file prior to visit.     Past Medical History:  Diagnosis Date  . Anemia   . Depression    Not for a very LONG time  . Dyspnea   . GERD (gastroesophageal reflux disease)    OCCASIONALLY  . Hx of inverse psoriasis   . Hypertension    Chronic Hypertension  . Migraine, ophthalmoplegic   . Pregnancy induced hypertension   . Ulcerative colitis   . Ulcerative colitis [facility])     Past  Surgical History:  Procedure Laterality Date  . CESAREAN SECTION  04/05/2011   Procedure: CESAREAN SECTION;  Surgeon: Logan Bores;  Location: [facility];  Service: Gynecology;  Laterality: N/A;  baby boy 62  APGAR 7/9  . CHOLECYSTECTOMY  2008  . COLONOSCOPY N/A   . DILATION AND CURETTAGE OF UTERUS    . VIDEO BRONCHOSCOPY Bilateral 11/08/2016   Procedure: VIDEO BRONCHOSCOPY WITHOUT FLUORO;  Surgeon: Rush Farmer, MD;  Location: [facility];  Service: Cardiopulmonary;  Laterality: Bilateral;  . VIDEO BRONCHOSCOPY WITH ENDOBRONCHIAL NAVIGATION N/A 12/15/2016   Procedure: VIDEO BRONCHOSCOPY WITH ENDOBRONCHIAL NAVIGATION;  Surgeon: Collene Gobble, MD;  Location[facility];  Service: Thoracic;  Laterality: N/A;    Family History  Problem Relation Age of Onset  . Pancreatic cancer Mother   . Hypertension Mother   . Heart attack Father   . Alcoholism Father   . Cirrhosis Father   . Breast cancer Father   . Hypertension Brother   . Arthritis/Rheumatoid Brother   . Hypertension Brother   . HIV Brother   . Bipolar disorder Brother   . Hypertension Brother   . Heart attack Other 29       Niece  . Breast cancer Maternal Grandmother   . Breast cancer Paternal Grandmother     Social History   Social History  . Marital status: Married    Spouse name: N/A  . Number of children: N/A  .  Years of education: N/A   Social History Main Topics  . Smoking status: Passive Smoke Exposure - Never Smoker  . Smokeless tobacco: Never Used     Comment: Father as a child  . Alcohol use 4.2 oz/week    3 Standard drinks or equivalent, 4 Glasses of wine per week  . Drug use: No  . Sexual activity: Yes   Other Topics Concern  . None   Social History Narrative   [facility] (12/09/16):   Originally from [place]. She moved to [place] in 1998. 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 No known mold exposure. Does have an outdoor cat & an indoor Geneticist, molecular. No bird  exposure. No hot tub exposure. Does have a couple of indoor plants. Enjoys cooking. Married with 3 kids.       Objective:   Physical Exam BP 110/80 (BP Location: Right Arm, Patient Position: Sitting, Cuff Size: Normal)   Pulse 83   Ht _0  (1.575 m)   Wt 149 lb 3.2 oz (67.7 kg)   LMP 01/06/2017   SpO2 100%   BMI 27.29 kg/m   General:  Awake. Alert. No distress. Husband with patient today. Integument:  Warm & dry. No rash on exposed skin.  Extremities:  No cyanosis or clubbing.  HEENT:  Moist mucus membranes. Minimal nasal turbinate swelling. No oral ulcers. Cardiovascular:  Regular rate. No edema. Normal S1 & S2. Pulmonary:  Good aeration & clear to auscultation bilaterally. Symmetric chest wall expansion. No accessory muscle use on room air. Abdomen: Soft. Normal bowel sounds. Nondistended.  Musculoskeletal:  Normal bulk and tone. No joint deformity or effusion appreciated.  IMAGING: CT CHEST W/O 01/27/17 (personally reviewed by me): Decreased size of bilateral parenchymal nodules with questionable new lower lobe nodule formation per radiology. No pleural effusion or thickening. No pericardial effusion. No pathologic mediastinal adenopathy.  CXR PA/LAT 12/17/16 (previously reviewed by me): Progression in bilateral lung nodules. No pleural effusion appreciated. Patient does have a thin-walled cystic lesion measuring approximately 3 cm in maximal dimension within right middle lobe that certainly be consistent with resolving dominant right middle lobe nodule. No new nodule or opacity appreciated. Heart normal in size & mediastinum normal in contour.  CT CHEST W/O 11/29/16 (Previously reviewed by me): Bilateral lung nodules that are lower lobe predominant. Some evidence of enlargement. Solid and groundglass component to the nodules. Right middle lobe lateral segment nodule appears to have airway running right through it. No pleural effusion or thickening. No pericardial effusion. No pathologic  mediastinal adenopathy.  MICROBIOLOGY: Urine streptococcal antigen (11/07/16):  Negative  Blood Cultures x2 (11/07/16):  Negative Blood fungal culture (11/07/16):  Negative  QuantiFERON-TB (11/07/16): Negative HIV (11/07/16): Nonreactive BAL (11/08/16):  Normal respiratory flora / PCP negative / AFB pending  Urine Histoplasma antigen (11/10/16): Negative Sputum culture (11/10/16):  Negative  Beta D glucan (12/09/16): Negative Aspergillus antigen (12/09/16): 0.08 Urine Blastomyces antigen (12/09/16): Negative  Navigational Bronchoscopy (12/15/16): Right middle lobe brush AFB smear: Negative Right middle lobe brush fungus: Negative Right middle lobe lavage AFB: Negative Right middle lobe lavage fungus: Negative Right middle lobe lavage PCP DFA: Negative Right lower lobe lavage AFB: Negative Right lower lobe lavage fungus: Negative Left lower lobe lavage AFB: Negative Left lower lobe lavage fungus: Negative  PATHOLOGY: BAL RML & RLL (11/08/16):  Negative for malignancy.   Navigational Bronchoscopy (12/15/16): Right middle lobe transbronchial biopsy: Negative for malignancy. Stains negative for fungus & AFB. Right middle lobe TBNA &  brushing: Negative for malignancy. Left lower lobe brushing & lavage: Negative for malignancy  LABS BAL RML (12/15/16):  WBC 84 (50% lymph, 3% eos, 43% neutro, & 4% monocytes)  ANCA Screen (12/09/16):  Negative     Assessment & Plan:  41 y.o. female with dyspnea on exertion, multiple lung nodules, and pleurisy. I reviewed the patient's chest CT imaging with her today which shows some questionable new nodules in her lower lung zones. Overall her parenchymal nodular process seems to be improving. We discussed her previous bronchoscopy results which have yielded negative cultures that are finalized. I suspect she will continue to recover from her underlying parenchymal process and doubt she will have any lasting damage. I instructed the patient contact my office if she  had any new symptoms before her next appointment. We will contact her with the results of her chest CT scan once available.  1. Dyspnea on exertion:  Improving. No further testing. 2. Multiple lung nodules: Suspect resolving infectious process. Repeat CT chest without contrast in late July 2018. 3. Pleurisy:  Resolved. 4. Follow-up: Return to clinic in 3 months or sooner if needed.  Sonia Baller Ashok Cordia, M.D. [facility] Pulmonary & Critical Care Pager:  [phone] After 3pm or if no response, call [phone] 1:45 PM 01/28/17

## 2017-01-28 NOTE — Addendum Note (Signed)
Addended by: Tyson Dense on: 01/28/2017 02:00 PM   Modules accepted: Orders

## 2017-01-28 NOTE — Patient Instructions (Addendum)
   Call me if you have any new breathing problems before your next appointment.  We will contact you with the result of your CT scan once I've reviewed it.  I will see you back in 3 months or sooner if needed.   TESTS ORDERED: 1. CT CHEST W/O toward the end of July

## 2017-01-28 NOTE — Addendum Note (Signed)
Addendum  created 01/28/17 1357 by Rica Koyanagi, MD   Sign clinical note

## 2017-03-28 ENCOUNTER — Ambulatory Visit (INDEPENDENT_AMBULATORY_CARE_PROVIDER_SITE_OTHER)
Admission: RE | Admit: 2017-03-28 | Discharge: 2017-03-28 | Disposition: A | Discharge status: Home / Self Care | Payer: BC Managed Care – PPO | Source: Ambulatory Visit | Attending: Pulmonary Disease | Admitting: Pulmonary Disease

## 2017-03-28 DIAGNOSIS — R918 Other nonspecific abnormal finding of lung field: Secondary | ICD-10-CM

## 2017-04-27 ENCOUNTER — Telehealth: Payer: Self-pay | Admitting: Pulmonary Disease

## 2017-04-27 ENCOUNTER — Other Ambulatory Visit: Payer: Self-pay | Admitting: Pulmonary Disease

## 2017-04-27 DIAGNOSIS — R918 Other nonspecific abnormal finding of lung field: Secondary | ICD-10-CM

## 2017-04-27 NOTE — Telephone Encounter (Signed)
Javier Glazier, MD  Tyson Dense, RN        Please order a chest CT Superdimensional Scan. Thank you.

## 2017-04-27 NOTE — Telephone Encounter (Signed)
CT has been ordered per JN. Nothing further was needed.

## 2017-05-05 ENCOUNTER — Ambulatory Visit (INDEPENDENT_AMBULATORY_CARE_PROVIDER_SITE_OTHER)
Admission: RE | Admit: 2017-05-05 | Discharge: 2017-05-05 | Disposition: A | Discharge status: Home / Self Care | Payer: BC Managed Care – PPO | Source: Ambulatory Visit | Attending: Pulmonary Disease | Admitting: Pulmonary Disease

## 2017-05-05 DIAGNOSIS — R918 Other nonspecific abnormal finding of lung field: Secondary | ICD-10-CM | POA: Diagnosis not present

## 2017-05-06 ENCOUNTER — Ambulatory Visit: Payer: BC Managed Care – PPO | Admitting: Pulmonary Disease

## 2017-05-16 ENCOUNTER — Encounter: Payer: Self-pay | Admitting: Pulmonary Disease

## 2017-05-16 ENCOUNTER — Ambulatory Visit (INDEPENDENT_AMBULATORY_CARE_PROVIDER_SITE_OTHER): Payer: BC Managed Care – PPO | Admitting: Pulmonary Disease

## 2017-05-16 ENCOUNTER — Other Ambulatory Visit (INDEPENDENT_AMBULATORY_CARE_PROVIDER_SITE_OTHER): Payer: BC Managed Care – PPO

## 2017-05-16 VITALS — BP 130/74 | HR 79 | Ht 62.0 in | Wt 153.2 lb

## 2017-05-16 DIAGNOSIS — R0609 Other forms of dyspnea: Secondary | ICD-10-CM

## 2017-05-16 DIAGNOSIS — R918 Other nonspecific abnormal finding of lung field: Secondary | ICD-10-CM

## 2017-05-16 LAB — SEDIMENTATION RATE: Sed Rate: 30 mm/hr — ABNORMAL HIGH (ref 0–20)

## 2017-05-16 LAB — C-REACTIVE PROTEIN: CRP: 0.3 mg/dL — AB (ref 0.5–20.0)

## 2017-05-16 NOTE — Addendum Note (Signed)
Addended by: Desmond Dike C on: 05/16/2017 04:23 PM   Modules accepted: Orders

## 2017-05-16 NOTE — Patient Instructions (Addendum)
   Please call our office if you experience any new fever, chills, sweats, or cough.  We will see you back in 2 months or sooner if needed.   TESTS ORDERED: 1. Serum ANA, ESR, CRP, Smith Antibody, DS DNA Antibody, Sjogrens Panel, Anti-CCP, Rheumatoid Factor, Centromere Antibody Screen, SCL-70, Histone Antibody, & ANCA Panel.

## 2017-05-16 NOTE — Progress Notes (Signed)
Subjective:    Patient ID: Gabrielle Wilkins, female    DOB: 10-20-1975, 41 y.o.   MRN: [id number]  C.C.:  Follow-up for Multiple Lung Nodules on CT Imaging, Dyspnea on Exertion, & Pleurisy.   HPI Multiple lung nodules: Previously hospitalized and underwent bronchoscopy with lavage on 11/08/16. Notable for previous travel to [place] without sick contact exposure. Off of Entyvio (1st dose 2/16 & last dose 3/2). Cultures from navigational bronchoscopy in April also negative. Nodule somewhat fluctuant in size especially in her left lower lobe recently.  Dyspnea on exertion: Previously improving. She admits she isn't exercising. She reports she does have dyspnea with walking up stairs that doesn't seem progressive.   Pleurisy: Previously patient had only experienced intermittent pleuritic chest pain as of May. At last appointment chest pain/pleurisy had totally resolved. She denies any pleuritic chest pain.  Review of Systems She reports she is still having occasional night sweats. No fever or chills. No chest pain or pressure. No rashes or abnormal bruising. No abdominal pain or nausea. No dry eyes or dry mouth. No joint swelling, erythema, or stiffness.  Allergies  Allergen Reactions  . Ceclor [Cefaclor] Hives    Patient said she can tolerate penicillin.  Hasn't had Keflex.     Current Outpatient Prescriptions on File Prior to Visit  Medication Sig Dispense Refill  . lisinopril-hydrochlorothiazide (PRINZIDE,ZESTORETIC) 10-12.5 MG per tablet Take 1 tablet by mouth daily at 6 (six) AM.      No current facility-administered medications on file prior to visit.     Past Medical History:  Diagnosis Date  . Anemia   . Depression    Not for a very LONG time  . Dyspnea   . GERD (gastroesophageal reflux disease)    OCCASIONALLY  . Hx of inverse psoriasis   . Hypertension    Chronic Hypertension  . Migraine, ophthalmoplegic   . Pregnancy induced hypertension   . Ulcerative  colitis   . Ulcerative colitis [facility])     Past Surgical History:  Procedure Laterality Date  . CESAREAN SECTION  04/05/2011   Procedure: CESAREAN SECTION;  Surgeon: Logan Bores;  Location: [facility];  Service: Gynecology;  Laterality: N/A;  baby boy 60  APGAR 7/9  . CHOLECYSTECTOMY  2008  . COLONOSCOPY N/A   . DILATION AND CURETTAGE OF UTERUS    . VIDEO BRONCHOSCOPY Bilateral 11/08/2016   Procedure: VIDEO BRONCHOSCOPY WITHOUT FLUORO;  Surgeon: Rush Farmer, MD;  Location: [place];  Service: Cardiopulmonary;  Laterality: Bilateral;  . VIDEO BRONCHOSCOPY WITH ENDOBRONCHIAL NAVIGATION N/A 12/15/2016   Procedure: VIDEO BRONCHOSCOPY WITH ENDOBRONCHIAL NAVIGATION;  Surgeon: Collene Gobble, MD;  Location: [facility];  Service: Thoracic;  Laterality: N/A;    Family History  Problem Relation Age of Onset  . Pancreatic cancer Mother   . Hypertension Mother   . Cystic kidney disease Mother   . Heart attack Father   . Alcoholism Father   . Cirrhosis Father   . Breast cancer Father   . Hypertension Brother   . Rheum arthritis Brother   . Arthritis/Rheumatoid Brother   . Hypertension Brother   . HIV Brother   . Bipolar disorder Brother   . Hypertension Brother   . Heart attack Other 4       Niece  . Breast cancer Maternal Grandmother   . Breast cancer Paternal Grandmother     Social History   Social History  . Marital status: Married  Spouse name: N/A  . Number of children: N/A  . Years of education: N/A   Social History Main Topics  . Smoking status: Passive Smoke Exposure - Never Smoker  . Smokeless tobacco: Never Used     Comment: Father as a child  . Alcohol use 4.2 oz/week    3 Standard drinks or equivalent, 4 Glasses of wine per week  . Drug use: No  . Sexual activity: Yes   Other Topics Concern  . None   Social History Narrative   [facility] (12/09/16):   Originally from [place]. She moved to [place] in 1998. 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 No known mold exposure. Does have an outdoor cat & an indoor Geneticist, molecular. No bird exposure. No hot tub exposure. Does have a couple of indoor plants. Enjoys cooking. Married with 3 kids.       Objective:   Physical Exam BP 130/74 (BP Location: Right Arm, Cuff Size: Normal)   Pulse 79   Ht _0  (1.575 m)   Wt 153 lb 4 oz (69.5 kg)   SpO2 97%   BMI 28.03 kg/m   General:  Awake. No distress. Comfortable. Integument:  Warm & dry. No rash on exposed skin. No bruising on exposed skin. Extremities:  No cyanosis or clubbing.  HEENT:  Moist mucus membranes. No nasal turbinate swelling. No oral ulcers. No scleral icterus. Cardiovascular:  Regular rate. No edema. Regular rhythm.  Pulmonary:  Normal work of breathing on room air. Clear to auscultation bilaterally. Abdomen: Soft. Normal bowel sounds. Nondistended.  Musculoskeletal:  Normal bulk and tone. No joint deformity or effusion appreciated.  IMAGING: SUPER D CT CHEST W/O 05/05/17 (personally reviewed by me):  Right-sided nodules remain significantly reduced in size. Sub-solid left lower lobe nodules notably decreased in size compared with previous CT imaging. No pathologic mediastinal adenopathy. Other subcentimeter nodules noted without signs of progression. No pathologically enlarged mediastinal adenopathy. No pleural effusion or thickening. No pericardial effusion.  CT CHEST W/O 01/27/17 (previously reviewed by me): Decreased size of bilateral parenchymal nodules with questionable new lower lobe nodule formation per radiology. No pleural effusion or thickening. No pericardial effusion. No pathologic mediastinal adenopathy.  CXR PA/LAT 12/17/16 (previously reviewed by me): Progression in bilateral lung nodules. No pleural effusion appreciated. Patient does have a thin-walled cystic lesion measuring approximately 3 cm in maximal dimension within right middle lobe that certainly be consistent with resolving dominant right  middle lobe nodule. No new nodule or opacity appreciated. Heart normal in size & mediastinum normal in contour.  CT CHEST W/O 11/29/16 (Previously reviewed by me): Bilateral lung nodules that are lower lobe predominant. Some evidence of enlargement. Solid and groundglass component to the nodules. Right middle lobe lateral segment nodule appears to have airway running right through it. No pleural effusion or thickening. No pericardial effusion. No pathologic mediastinal adenopathy.  MICROBIOLOGY: Urine streptococcal antigen (11/07/16):  Negative  Blood Cultures x2 (11/07/16):  Negative Blood fungal culture (11/07/16):  Negative  QuantiFERON-TB (11/07/16): Negative HIV (11/07/16): Nonreactive BAL (11/08/16):  Normal respiratory flora / PCP negative / AFB negative Urine Histoplasma antigen (11/10/16): Negative Sputum culture (11/10/16):  Negative  Beta D glucan (12/09/16): Negative Aspergillus antigen (12/09/16): 0.08 Urine Blastomyces antigen (12/09/16): Negative  Navigational Bronchoscopy (12/15/16): Right middle lobe brush AFB smear: Negative Right middle lobe brush fungus: Negative Right middle lobe lavage AFB: Negative Right middle lobe lavage fungus: Negative Right middle lobe lavage PCP DFA: Negative Right lower lobe  lavage AFB: Negative Right lower lobe lavage fungus: Negative Left lower lobe lavage AFB: Negative Left lower lobe lavage fungus: Negative  PATHOLOGY: BAL RML & RLL (11/08/16):  Negative for malignancy.   Navigational Bronchoscopy (12/15/16): Right middle lobe transbronchial biopsy: Negative for malignancy. Stains negative for fungus & AFB. Right middle lobe TBNA & brushing: Negative for malignancy. Left lower lobe brushing & lavage: Negative for malignancy  LABS BAL RML (12/15/16):  WBC 84 (50% lymph, 3% eos, 43% neutro, & 4% monocytes)  ANCA Screen (12/09/16):  Negative     Assessment & Plan:  41 y.o. female with multiple bilateral lung nodules. I reviewed her chest CT scan  with her today and given the waxing and waning nature of these nodules and question of possible autoimmune process. Certainly, infection is possible with an atypical organism. However, she has not cultured out any organism. Additionally, malignancy is less likely given the aggressive nature of these nodules. She does continue to have dyspnea on exertion which I do not feel is related to her lung nodule but likely more due to deconditioning. I instructed the patient to notify me if she had any new symptoms or questions before her next appointment.  1. Multiple lung nodules: Checking autoimmune workup for possible diseases. Holding on bronchoscopy and further cultures at this time. 2. Dyspnea on exertion: Likely multifactorial and likely due to deconditioning. No pulmonary function testing at this time. 3. Follow-up:  Return to clinic in 2 months or sooner if needed.  Sonia Baller Ashok Cordia, M.D. [facility]monary & Critical Care Pager:  [phone] After 3pm or if no response, call [phone] 2:30 PM 05/16/17

## 2017-05-17 LAB — CENTROMERE ANTIBODIES: Centromere Ab Screen: <1 AI

## 2017-05-19 LAB — ANCA SCREEN W REFLEX TITER: ANCA SCREEN: NEGATIVE

## 2017-05-19 LAB — CYCLIC CITRUL PEPTIDE ANTIBODY, IGG

## 2017-05-19 LAB — SJOGRENS SYNDROME-B EXTRACTABLE NUCLEAR ANTIBODY: SSB (LA) (ENA) ANTIBODY, IGG: NEGATIVE AI

## 2017-05-19 LAB — HISTONE ANTIBODIES, IGG, BLOOD: Histone Ab: <1 U (ref <1.0)

## 2017-05-19 LAB — ANTI-SCLERODERMA ANTIBODY: SCLERODERMA (SCL-70) (ENA) ANTIBODY, IGG: NEGATIVE AI

## 2017-05-19 LAB — SJOGRENS SYNDROME-A EXTRACTABLE NUCLEAR ANTIBODY: SSA (Ro) (ENA) Antibody, IgG: <1 AI

## 2017-05-19 LAB — ANTI-DNA ANTIBODY, DOUBLE-STRANDED

## 2017-05-19 LAB — RHEUMATOID FACTOR: Rhuematoid fact SerPl-aCnc: <14 IU/mL (ref <14)

## 2017-05-19 LAB — ANTI-SMITH ANTIBODY: ENA SM AB SER-ACNC: NEGATIVE AI

## 2017-07-27 ENCOUNTER — Ambulatory Visit (INDEPENDENT_AMBULATORY_CARE_PROVIDER_SITE_OTHER): Payer: BC Managed Care – PPO | Admitting: Pulmonary Disease

## 2017-07-27 ENCOUNTER — Encounter: Payer: Self-pay | Admitting: Pulmonary Disease

## 2017-07-27 VITALS — BP 118/64 | HR 80 | Ht 62.0 in | Wt 155.2 lb

## 2017-07-27 DIAGNOSIS — R091 Pleurisy: Secondary | ICD-10-CM | POA: Diagnosis not present

## 2017-07-27 DIAGNOSIS — R0609 Other forms of dyspnea: Secondary | ICD-10-CM

## 2017-07-27 DIAGNOSIS — R918 Other nonspecific abnormal finding of lung field: Secondary | ICD-10-CM

## 2017-07-27 NOTE — Progress Notes (Signed)
Subjective:    Patient ID: Gabrielle Wilkins, female    DOB: July 02, 1976, 41 y.o.   MRN: [id number]  C.C.:  Follow-up for Multiple Lung Nodules on CT Imaging, Dyspnea on Exertion, & Pleurisy.   HPI Multiple lung nodules: Previously hospitalized and underwent bronchoscopy with lavage on 11/08/16. Prior travel to [place] without sick contact exposure. Last dose of Entyvio last dose 10/29/16. Also underwent navigational bronchoscopy in April. Studies negative. Nodules fluctuate in size. Last CT imaging with super dimension in September. Autoimmune workup from last appointment negative.  Dyspnea on exertion: Suspected secondary to deconditioning. No pulmonary function testing. She reports she hasn't yet started an exercise regimen. Reports improving dyspnea as well.   Pleurisy: Previously having intermittent chest pain. Pain had previously resolved at last appointment. No pleurisy or chest pain.   Review of Systems She reports mild bilateral knee pain a couple of weeks ago. No erythema but she did have some mild edema in her left knee that has resolved. She reports her pain has improved but not totally resolved. No rashes or bruising. No fever, chills, or sweats. No stiffness or pain in her hands. Has noticed some pain and stiffness in her back.   Allergies  Allergen Reactions  . Ceclor [Cefaclor] Hives    Patient said she can tolerate penicillin.  Hasn't had Keflex.     Current Outpatient Medications on File Prior to Visit  Medication Sig Dispense Refill  . lisinopril-hydrochlorothiazide (PRINZIDE,ZESTORETIC) 10-12.5 MG per tablet Take 1 tablet by mouth daily at 6 (six) AM.      No current facility-administered medications on file prior to visit.     Past Medical History:  Diagnosis Date  . Anemia   . Depression    Not for a very LONG time  . Dyspnea   . GERD (gastroesophageal reflux disease)    OCCASIONALLY  . Hx of inverse psoriasis   . Hypertension    Chronic  Hypertension  . Migraine, ophthalmoplegic   . Pregnancy induced hypertension   . Ulcerative colitis   . Ulcerative colitis [facility])     Past Surgical History:  Procedure Laterality Date  . CESAREAN SECTION  04/05/2011   Procedure: CESAREAN SECTION;  Surgeon: Logan Bores;  Location: [facility];  Service: Gynecology;  Laterality: N/A;  baby boy 23  APGAR 7/9  . CHOLECYSTECTOMY  2008  . COLONOSCOPY N/A   . DILATION AND CURETTAGE OF UTERUS    . VIDEO BRONCHOSCOPY Bilateral 11/08/2016   Procedure: VIDEO BRONCHOSCOPY WITHOUT FLUORO;  Surgeon: Rush Farmer, MD;  Location: [facility];  Service: Cardiopulmonary;  Laterality: Bilateral;  . VIDEO BRONCHOSCOPY WITH ENDOBRONCHIAL NAVIGATION N/A 12/15/2016   Procedure: VIDEO BRONCHOSCOPY WITH ENDOBRONCHIAL NAVIGATION;  Surgeon: Collene Gobble, MD;  Location: [facility];  Service: Thoracic;  Laterality: N/A;    Family History  Problem Relation Age of Onset  . Pancreatic cancer Mother   . Hypertension Mother   . Cystic kidney disease Mother   . Heart attack Father   . Alcoholism Father   . Cirrhosis Father   . Breast cancer Father   . Hypertension Brother   . Rheum arthritis Brother   . Arthritis/Rheumatoid Brother   . Hypertension Brother   . HIV Brother   . Bipolar disorder Brother   . Hypertension Brother   . Heart attack Other 53       Niece  . Breast cancer Maternal Grandmother   . Breast cancer Paternal Grandmother  Social History   Socioeconomic History  . Marital status: Married    Spouse name: None  . Number of children: None  . Years of education: None  . Highest education level: None  Social Needs  . Financial resource strain: None  . Food insecurity - worry: None  . Food insecurity - inability: None  . Transportation needs - medical: None  . Transportation needs - non-medical: None  Occupational History  . None  Tobacco Use  . Smoking status: Passive Smoke Exposure - Never Smoker  . Smokeless tobacco: Never  Used  . Tobacco comment: Father as a child  Substance and Sexual Activity  . Alcohol use: Yes    Alcohol/week: 4.2 oz    Types: 3 Standard drinks or equivalent, 4 Glasses of wine per week  . Drug use: No  . Sexual activity: Yes  Other Topics Concern  . None  Social History Narrative    Pulmonary (12/09/16):   Originally from [place]. She moved to [place] in 1998. 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 No known mold exposure. Does have an outdoor cat & an indoor Geneticist, molecular. No bird exposure. No hot tub exposure. Does have a couple of indoor plants. Enjoys cooking. Married with 3 kids.       Objective:   Physical Exam BP 118/64 (BP Location: Left Arm, Cuff Size: Normal)   Pulse 80   Ht _0  (1.575 m)   Wt 155 lb 4 oz (70.4 kg)   SpO2 98%   BMI 28.40 kg/m   General:  Awake. Caucasian female. No distress. Integument: No rash. Warm. Dry. Extremities:  No cyanosis or clubbing.  HEENT:  Moist mucus membranes. No nasal turbinate swelling. No scleral icterus. Cardiovascular:  Regular rate. No edema. Regular rhythm.  Pulmonary:  Good aeration & clear to auscultation bilaterally. Symmetric chest wall expansion. No accessory muscle use on room air. Abdomen: Soft. Normal bowel sounds. Nondistended.  Musculoskeletal:  Normal bulk and tone. Hand grip strength 5/5 bilaterally. No joint deformity or effusion appreciated. No synovial thickening. Neurological:  Cranial nerves 2-12 grossly in tact. No meningismus. Moving all 4 extremities equally. Symmetric brachioradialis reflexes.  IMAGING: SUPER D CT CHEST W/O 05/05/17 (previously reviewed by me):  Right-sided nodules remain significantly reduced in size. Sub-solid left lower lobe nodules notably decreased in size compared with previous CT imaging. No pathologic mediastinal adenopathy. Other subcentimeter nodules noted without signs of progression. No pathologically enlarged mediastinal adenopathy. No pleural  effusion or thickening. No pericardial effusion.  CT CHEST W/O 01/27/17 (previously reviewed by me): Decreased size of bilateral parenchymal nodules with questionable new lower lobe nodule formation per radiology. No pleural effusion or thickening. No pericardial effusion. No pathologic mediastinal adenopathy.  CXR PA/LAT 12/17/16 (previously reviewed by me): Progression in bilateral lung nodules. No pleural effusion appreciated. Patient does have a thin-walled cystic lesion measuring approximately 3 cm in maximal dimension within right middle lobe that certainly be consistent with resolving dominant right middle lobe nodule. No new nodule or opacity appreciated. Heart normal in size & mediastinum normal in contour.  CT CHEST W/O 11/29/16 (Previously reviewed by me): Bilateral lung nodules that are lower lobe predominant. Some evidence of enlargement. Solid and groundglass component to the nodules. Right middle lobe lateral segment nodule appears to have airway running right through it. No pleural effusion or thickening. No pericardial effusion. No pathologic mediastinal adenopathy.  MICROBIOLOGY: Urine streptococcal antigen (11/07/16):  Negative  Blood Cultures x2 (11/07/16):  Negative  Blood fungal culture (11/07/16):  Negative  QuantiFERON-TB (11/07/16): Negative HIV (11/07/16): Nonreactive BAL (11/08/16):  Normal respiratory flora / PCP negative / AFB negative Urine Histoplasma antigen (11/10/16): Negative Sputum culture (11/10/16):  Negative  Beta D glucan (12/09/16): Negative Aspergillus antigen (12/09/16): 0.08 Urine Blastomyces antigen (12/09/16): Negative  Navigational Bronchoscopy (12/15/16): Right middle lobe brush AFB smear: Negative Right middle lobe brush fungus: Negative Right middle lobe lavage AFB: Negative Right middle lobe lavage fungus: Negative Right middle lobe lavage PCP DFA: Negative Right lower lobe lavage AFB: Negative Right lower lobe lavage fungus: Negative Left lower lobe  lavage AFB: Negative Left lower lobe lavage fungus: Negative  PATHOLOGY: BAL RML & RLL (11/08/16):  Negative for malignancy.   Navigational Bronchoscopy (12/15/16): Right middle lobe transbronchial biopsy: Negative for malignancy. Stains negative for fungus & AFB. Right middle lobe TBNA & brushing: Negative for malignancy. Left lower lobe brushing & lavage: Negative for malignancy  LABS 05/16/17 Centromere antibody:  <1.0 ESR: 30 CRP: 0.3 Smith Ab:  ,1.0 DS DNA Ab:  <1 SSA:  <1.0 SSB:  <1.0 Anti-CCP:  <16 RF:  <14 SCL-70:  <1.0 Histone Ab:  <1.0 ANCA Screen:  Negative   BAL RML (12/15/16):  WBC 84 (50% lymph, 3% eos, 43% neutro, & 4% monocytes)  ANCA Screen (12/09/16):  Negative     Assessment & Plan:  41 y.o. female with waxing and waning multiple bilateral lung nodules. Previous cultures and biopsy specimens negative. Suspect she has an undiagnosed connective tissue disease that has yet to fully manifest. Cautioned the patient to continue to monitor her symptoms and report any new findings to my office. Deferring rheumatology evaluation at this time given her negative serologic workup at last appointment.  1. Multiple lung nodules: Suspect underlying autoimmune disease. Continuing to monitor for new symptoms. Deferring rheumatology referral at this time. 2. Dyspnea on exertion: Multifactorial. Deferring pulmonary function testing. 3. Follow-up:  Return to clinic in 6 months or sooner if needed.  Sonia Baller Ashok Cordia, M.D. [facility] Pulmonary & Critical Care Pager:  [phone] After 3pm or if no response, call [phone] 9:49 AM 07/27/17

## 2017-07-27 NOTE — Patient Instructions (Addendum)
   Call my office if you have any new breathing problems or questions before your next appointment.  We will see you back in 6 months or sooner if needed.

## 2018-02-06 ENCOUNTER — Ambulatory Visit (INDEPENDENT_AMBULATORY_CARE_PROVIDER_SITE_OTHER): Payer: BC Managed Care – PPO | Admitting: Emergency Medicine

## 2018-02-06 ENCOUNTER — Encounter: Payer: Self-pay | Admitting: Emergency Medicine

## 2018-02-06 DIAGNOSIS — R918 Other nonspecific abnormal finding of lung field: Secondary | ICD-10-CM | POA: Diagnosis not present

## 2018-02-06 NOTE — Progress Notes (Signed)
Subjective:    Patient ID: Gabrielle Wilkins, female    DOB: 12-11-1975, 42 y.o.   MRN: [id number]  HPI 42 year old never smoker who has been followed by Dr. Ashok Cordia for dyspnea and principally for multiple pulmonary nodules. Hx of UC, had been on immunotherapy but currently off.  I met her in April 2018 when she underwent navigational bronchoscopy.  Needle brushings, biopsies and forceps biopsies of nodules in the right middle lobe, right lower lobe and left lower lobe were performed, were all negative for malignancy or any clear infectious cause.  Her work-up has included negative QuantiFERON gold, negative urine histoplasma antigen, Aspergillus antigen Blastomyces antigen. She had a uniformly negative autoimmune screen in 04/2017.  Her most recent CT was 05/06/2017 and I reviewed.  This showed that there is been a decrease in size and density of her remaining left lower lobe pulmonary nodules, bilateral smaller nodules.  There were no new nodules seen. She is about to start Simponi (a-TNF). Was started on pred a week ago for her UC, sees Dr Benson Norway.    Review of Systems  Past Medical History:  Diagnosis Date  . Anemia   . Depression    Not for a very LONG time  . Dyspnea   . GERD (gastroesophageal reflux disease)    OCCASIONALLY  . Hx of inverse psoriasis   . Hypertension    Chronic Hypertension  . Migraine, ophthalmoplegic   . Pregnancy induced hypertension   . Ulcerative colitis   . Ulcerative colitis ([place])      Family History  Problem Relation Age of Onset  . Pancreatic cancer Mother   . Hypertension Mother   . Cystic kidney disease Mother   . Heart attack Father   . Alcoholism Father   . Cirrhosis Father   . Breast cancer Father   . Hypertension Brother   . Rheum arthritis Brother   . Arthritis/Rheumatoid Brother   . Hypertension Brother   . HIV Brother   . Bipolar disorder Brother   . Hypertension Brother   . Heart attack Other 57       Niece  . Breast cancer Maternal  Grandmother   . Breast cancer Paternal Grandmother      Social History   Socioeconomic History  . Marital status: Married    Spouse name: Not on file  . Number of children: Not on file  . Years of education: Not on file  . Highest education level: Not on file  Occupational History  . Not on file  Social Needs  . Financial resource strain: Not on file  . Food insecurity:    Worry: Not on file    Inability: Not on file  . Transportation needs:    Medical: Not on file    Non-medical: Not on file  Tobacco Use  . Smoking status: Passive Smoke Exposure - Never Smoker  . Smokeless tobacco: Never Used  . Tobacco comment: Father as a child  Substance and Sexual Activity  . Alcohol use: Yes    Alcohol/week: 4.2 oz    Types: 3 Standard drinks or equivalent, 4 Glasses of wine per week  . Drug use: No  . Sexual activity: Yes  Lifestyle  . Physical activity:    Days per week: Not on file    Minutes per session: Not on file  . Stress: Not on file  Relationships  . Social connections:    Talks on phone: Not on file    Gets together:  Not on file    Attends religious service: Not on file    Active member of club or organization: Not on file    Attends meetings of clubs or organizations: Not on file    Relationship status: Not on file  . Intimate partner violence:    Fear of current or ex partner: Not on file    Emotionally abused: Not on file    Physically abused: Not on file    Forced sexual activity: Not on file  Other Topics Concern  . Not on file  Social History Narrative   [facility] (12/09/16):   Originally from [place]. She moved to [place] in 1998. 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 No known mold exposure. Does have an outdoor cat & an indoor Geneticist, molecular. No bird exposure. No hot tub exposure. Does have a couple of indoor plants. Enjoys cooking. Married with 3 kids.      Allergies  Allergen Reactions  . Ceclor [Cefaclor] Hives     Patient said she can tolerate penicillin.  Hasn't had Keflex.      Outpatient Medications Prior to Visit  Medication Sig Dispense Refill  . lisinopril-hydrochlorothiazide (PRINZIDE,ZESTORETIC) 10-12.5 MG per tablet Take 1 tablet by mouth daily at 6 (six) AM.     . mesalamine (CANASA) 1000 MG suppository Place 1,000 mg rectally at bedtime.    . predniSONE (DELTASONE) 20 MG tablet Take 40 mg by mouth daily with breakfast.     No facility-administered medications prior to visit.         Objective:   Physical Exam   Vitals:   02/06/18 1103 02/06/18 1104  BP:  112/82  Pulse:  67  SpO2:  96%  Weight: 129 lb (58.5 kg)   Height: _0  (1.575 m)    Gen: Pleasant, well-nourished, in no distress,  normal affect  ENT: No lesions,  mouth clear,  oropharynx clear, no postnasal drip  Neck: No JVD, no stridor  Lungs: No use of accessory muscles, clear bilaterally, no wheeze or crackles.   Cardiovascular: RRR, heart sounds normal, no murmur or gallops, no peripheral edema  Musculoskeletal: No deformities, no cyanosis or clubbing  Neuro: alert, non focal  Skin: Warm, no lesions or rash      Assessment & Plan:  Pulmonary nodules Interesting case of waxing and waning pulmonary nodular disease.  We did navigational bronchoscopy just over a year ago that was negative for malignancy, negative culture data.  She also has a uniformly negative serological autoimmune work-up.  At this point the etiology of the nodules is not entirely clear but it could be associated with her ulcerative colitis.  Also possible I suppose that she could have an additional autoimmune disease that we have a diagnosed.  She is about to go on Simponi for flaring of her ulcerative colitis.  I would like to repeat her CT scan of the chest before that medication is added so that we can assess the current degree of her nodular disease.  I do not believe there is a contraindication to starting the Simponi as we do not have  any evidence that this is an opportunistic infectious process.  If the nodules do persist then it is possible that the Simponi will even help them if they are related to the UC.  Depending on their behavior we will continue to discuss any necessary subsequent work-up including possible repeat biopsy, BAL.    Baltazar Apo, MD, PhD 02/06/2018, 11:32 AM  [facility] [phone] or if no answer [phone]

## 2018-02-06 NOTE — Patient Instructions (Addendum)
We will plan to repeat your CT scan of the chest now to look for interval change compared with 05/06/2017. Agree with your prednisone and with initiation of Simponi as planned. Follow with Dr Lamonte Sakai next available to review the CT chest together.

## 2018-02-06 NOTE — Assessment & Plan Note (Signed)
Interesting case of waxing and waning pulmonary nodular disease.  We did navigational bronchoscopy just over a year ago that was negative for malignancy, negative culture data.  She also has a uniformly negative serological autoimmune work-up.  At this point the etiology of the nodules is not entirely clear but it could be associated with her ulcerative colitis.  Also possible I suppose that she could have an additional autoimmune disease that we have a diagnosed.  She is about to go on Simponi for flaring of her ulcerative colitis.  I would like to repeat her CT scan of the chest before that medication is added so that we can assess the current degree of her nodular disease.  I do not believe there is a contraindication to starting the Simponi as we do not have any evidence that this is an opportunistic infectious process.  If the nodules do persist then it is possible that the Simponi will even help them if they are related to the UC.  Depending on their behavior we will continue to discuss any necessary subsequent work-up including possible repeat biopsy, BAL.

## 2018-02-16 ENCOUNTER — Ambulatory Visit (INDEPENDENT_AMBULATORY_CARE_PROVIDER_SITE_OTHER)
Admission: RE | Admit: 2018-02-16 | Discharge: 2018-02-16 | Disposition: A | Discharge status: Home / Self Care | Payer: BC Managed Care – PPO | Source: Ambulatory Visit | Attending: Emergency Medicine | Admitting: Emergency Medicine

## 2018-02-16 DIAGNOSIS — R918 Other nonspecific abnormal finding of lung field: Secondary | ICD-10-CM

## 2018-03-23 ENCOUNTER — Ambulatory Visit: Payer: BC Managed Care – PPO | Admitting: Emergency Medicine

## 2018-04-07 ENCOUNTER — Ambulatory Visit: Payer: BC Managed Care – PPO | Admitting: Emergency Medicine

## 2018-04-11 ENCOUNTER — Encounter: Payer: Self-pay | Admitting: Emergency Medicine

## 2018-04-11 ENCOUNTER — Ambulatory Visit (INDEPENDENT_AMBULATORY_CARE_PROVIDER_SITE_OTHER): Payer: BC Managed Care – PPO | Admitting: Emergency Medicine

## 2018-04-11 DIAGNOSIS — R918 Other nonspecific abnormal finding of lung field: Secondary | ICD-10-CM

## 2018-04-11 NOTE — Patient Instructions (Signed)
Your CT scan from 02/16/2018 shows resolution of your pulmonary nodules. We will hold off on any repeat CT scans of the chest unless you develop new symptoms.  Please let us know immediately if you have changes in your breathing, cough, mucus production, chest discomfort. Follow with Dr. Lamonte Sakai in 1 year or sooner if you have problems. We will follow your chest x-ray annually.

## 2018-04-11 NOTE — Progress Notes (Signed)
Subjective:    Patient ID: Gabrielle Wilkins, female    DOB: [birth year], 42 y.o.   MRN: [id number]  HPI 42 year old never smoker who has been followed by Dr. Ashok Cordia for dyspnea and principally for multiple pulmonary nodules. Hx of UC, had been on immunotherapy but currently off.  I met her in April 2018 when she underwent navigational bronchoscopy.  Needle brushings, biopsies and forceps biopsies of nodules in the right middle lobe, right lower lobe and left lower lobe were performed, were all negative for malignancy or any clear infectious cause.  Her work-up has included negative QuantiFERON gold, negative urine histoplasma antigen, Aspergillus antigen Blastomyces antigen. She had a uniformly negative autoimmune screen in 04/2017.  Her most recent CT was 05/06/2017 and I reviewed.  This showed that there is been a decrease in size and density of her remaining left lower lobe pulmonary nodules, bilateral smaller nodules.  There were no new nodules seen. She is about to start Simponi (a-TNF). Was started on pred a week ago for her UC, sees Dr Benson Norway.   ROV 04/11/18 --follow-up visit for pleasant 42 year old woman with a history of ulcerative colitis, pulmonary nodules that hand of been evaluated extensively including by navigational bronchoscopy.  Etiology unclear, all cultures and serologies negative.  Her most recent CT scan was done on 02/16/2018 which I have reviewed.  This shows overall resolution of her pulmonary nodules including the residual left lower lobe nodules that had been seen on prior scan.  She has been started on Simponi for her UC, and has had some improvement in GI sx. She is having some sores in her nares. She denies any dyspnea, but knows that she is inactive.   Review of Systems  Past Medical History:  Diagnosis Date  . Anemia   . Depression    Not for a very LONG time  . Dyspnea   . GERD (gastroesophageal reflux disease)    OCCASIONALLY  . Hx of inverse psoriasis   . Hypertension    Chronic Hypertension  . Migraine, ophthalmoplegic   . Pregnancy induced hypertension   . Ulcerative colitis   . Ulcerative colitis ([place])      Family History  Problem Relation Age of Onset  . Pancreatic cancer Mother   . Hypertension Mother   . Cystic kidney disease Mother   . Heart attack Father   . Alcoholism Father   . Cirrhosis Father   . Breast cancer Father   . Hypertension Brother   . Rheum arthritis Brother   . Arthritis/Rheumatoid Brother   . Hypertension Brother   . HIV Brother   . Bipolar disorder Brother   . Hypertension Brother   . Heart attack Other 80       Niece  . Breast cancer Maternal Grandmother   . Breast cancer Paternal Grandmother      Social History   Socioeconomic History  . Marital status: Married    Spouse name: Not on file  . Number of children: Not on file  . Years of education: Not on file  . Highest education level: Not on file  Occupational History  . Not on file  Social Needs  . Financial resource strain: Not on file  . Food insecurity:    Worry: Not on file    Inability: Not on file  . Transportation needs:    Medical: Not on file    Non-medical: Not on file  Tobacco Use  . Smoking status: Passive Smoke Exposure -  Never Smoker  . Smokeless tobacco: Never Used  . Tobacco comment: Father as a child  Substance and Sexual Activity  . Alcohol use: Yes    Alcohol/week: 7.0 standard drinks    Types: 3 Standard drinks or equivalent, 4 Glasses of wine per week  . Drug use: No  . Sexual activity: Yes  Lifestyle  . Physical activity:    Days per week: Not on file    Minutes per session: Not on file  . Stress: Not on file  Relationships  . Social connections:    Talks on phone: Not on file    Gets together: Not on file    Attends religious service: Not on file    Active member of club or organization: Not on file    Attends meetings of clubs or organizations: Not on file    Relationship status: Not on file  . Intimate partner  violence:    Fear of current or ex partner: Not on file    Emotionally abused: Not on file    Physically abused: Not on file    Forced sexual activity: Not on file  Other Topics Concern  . Not on file  Social History Narrative   [facility] (12/09/16):   Originally from [place]. She moved to [place] in 1998. 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 No known mold exposure. Does have an outdoor cat & an indoor Geneticist, molecular. No bird exposure. No hot tub exposure. Does have a couple of indoor plants. Enjoys cooking. Married with 3 kids.      Allergies  Allergen Reactions  . Ceclor [Cefaclor] Hives    Patient said she can tolerate penicillin.  Hasn't had Keflex.      Outpatient Medications Prior to Visit  Medication Sig Dispense Refill  . lisinopril-hydrochlorothiazide (PRINZIDE,ZESTORETIC) 10-12.5 MG per tablet Take 1 tablet by mouth daily at 6 (six) AM.     . mesalamine (CANASA) 1000 MG suppository Place 1,000 mg rectally at bedtime.    . predniSONE (DELTASONE) 20 MG tablet Take 40 mg by mouth daily with breakfast.     No facility-administered medications prior to visit.         Objective:   Physical Exam   Vitals:   04/11/18 0948  BP: 114/62  Pulse: 78  SpO2: 100%  Weight: 131 lb (59.4 kg)  Height: 5' 2"  (1.575 m)   Gen: Pleasant, well-nourished, in no distress,  normal affect  ENT: No lesions,  mouth clear,  oropharynx clear, no postnasal drip  Neck: No JVD, no stridor  Lungs: No use of accessory muscles, clear bilaterally, no wheeze or crackles.   Cardiovascular: RRR, heart sounds normal, no murmur or gallops, no peripheral edema  Musculoskeletal: No deformities, no cyanosis or clubbing  Neuro: alert, non focal  Skin: Warm, no lesions or rash      Assessment & Plan:  Pulmonary nodules Pulmonary nodular disease of unclear etiology, extensive work-up has been nondiagnostic and reassuring.  Now the nodules have spontaneously  resolved on follow-up imaging 02/16/2018.  I do not think we need to do any other work-up or serial CTs unless she changes clinically.  She knows to call me if she develops symptoms.  We will follow an annual chest x-ray for a few years to ensure no subclinical change.  Baltazar Apo, MD, PhD 04/11/2018, 10:08 AM [facility] [phone] or if no answer [phone]

## 2018-04-11 NOTE — Assessment & Plan Note (Signed)
Pulmonary nodular disease of unclear etiology, extensive work-up has been nondiagnostic and reassuring.  Now the nodules have spontaneously resolved on follow-up imaging 02/16/2018.  I do not think we need to do any other work-up or serial CTs unless she changes clinically.  She knows to call me if she develops symptoms.  We will follow an annual chest x-ray for a few years to ensure no subclinical change.

## 2018-05-17 ENCOUNTER — Encounter: Payer: Self-pay | Admitting: Hematology & Oncology

## 2018-05-23 ENCOUNTER — Telehealth: Payer: Self-pay | Admitting: Hematology and Oncology

## 2018-05-23 NOTE — Telephone Encounter (Signed)
New referral received from Dr. Dema Severin for abnormal cbc. Pt has been cld and scheduled to see Dr. Audelia Hives on 9/27 at 1pm. Pt has agreed to the appt date and time.

## 2018-05-25 ENCOUNTER — Encounter: Payer: BC Managed Care – PPO | Admitting: Hematology and Oncology

## 2018-05-26 ENCOUNTER — Inpatient Hospital Stay: Payer: BC Managed Care – PPO | Attending: Hematology and Oncology | Admitting: Hematology and Oncology

## 2018-05-26 ENCOUNTER — Encounter: Payer: Self-pay | Admitting: Hematology and Oncology

## 2018-05-26 ENCOUNTER — Inpatient Hospital Stay: Payer: BC Managed Care – PPO

## 2018-05-26 VITALS — BP 142/100 | HR 88 | Temp 98.5°F | Resp 18 | Ht 62.0 in | Wt 131.5 lb

## 2018-05-26 DIAGNOSIS — I700 Atherosclerosis of aorta: Secondary | ICD-10-CM | POA: Insufficient documentation

## 2018-05-26 DIAGNOSIS — K51211 Ulcerative (chronic) proctitis with rectal bleeding: Secondary | ICD-10-CM | POA: Insufficient documentation

## 2018-05-26 DIAGNOSIS — D473 Essential (hemorrhagic) thrombocythemia: Secondary | ICD-10-CM | POA: Insufficient documentation

## 2018-05-26 DIAGNOSIS — K5120 Ulcerative (chronic) proctitis without complications: Secondary | ICD-10-CM | POA: Insufficient documentation

## 2018-05-26 DIAGNOSIS — Z7722 Contact with and (suspected) exposure to environmental tobacco smoke (acute) (chronic): Secondary | ICD-10-CM | POA: Diagnosis not present

## 2018-05-26 DIAGNOSIS — Z79899 Other long term (current) drug therapy: Secondary | ICD-10-CM | POA: Insufficient documentation

## 2018-05-26 DIAGNOSIS — D849 Immunodeficiency, unspecified: Secondary | ICD-10-CM | POA: Diagnosis not present

## 2018-05-26 DIAGNOSIS — D899 Disorder involving the immune mechanism, unspecified: Secondary | ICD-10-CM

## 2018-05-26 DIAGNOSIS — E785 Hyperlipidemia, unspecified: Secondary | ICD-10-CM | POA: Insufficient documentation

## 2018-05-26 DIAGNOSIS — I10 Essential (primary) hypertension: Secondary | ICD-10-CM | POA: Insufficient documentation

## 2018-05-26 DIAGNOSIS — D500 Iron deficiency anemia secondary to blood loss (chronic): Secondary | ICD-10-CM

## 2018-05-26 DIAGNOSIS — D649 Anemia, unspecified: Secondary | ICD-10-CM | POA: Insufficient documentation

## 2018-05-26 DIAGNOSIS — K219 Gastro-esophageal reflux disease without esophagitis: Secondary | ICD-10-CM | POA: Diagnosis not present

## 2018-05-26 DIAGNOSIS — R918 Other nonspecific abnormal finding of lung field: Secondary | ICD-10-CM | POA: Diagnosis not present

## 2018-05-26 LAB — CBC WITH DIFFERENTIAL (CANCER CENTER ONLY)
Basophils Absolute: 0.1 10*3/uL (ref 0.0–0.1)
Basophils Relative: 1 %
EOS ABS: 0.3 10*3/uL (ref 0.0–0.5)
EOS PCT: 4 %
HCT: 34.2 % — ABNORMAL LOW (ref 34.8–46.6)
HEMOGLOBIN: 11.2 g/dL — AB (ref 11.6–15.9)
LYMPHS ABS: 2.8 10*3/uL (ref 0.9–3.3)
Lymphocytes Relative: 38 %
MCH: 25.2 pg (ref 25.1–34.0)
MCHC: 32.8 g/dL (ref 31.5–36.0)
MCV: 76.9 fL — ABNORMAL LOW (ref 79.5–101.0)
MONOS PCT: 17 %
Monocytes Absolute: 1.2 10*3/uL — ABNORMAL HIGH (ref 0.1–0.9)
NEUTROS PCT: 40 %
Neutro Abs: 3 10*3/uL (ref 1.5–6.5)
PLATELETS: 416 10*3/uL — AB (ref 145–400)
RBC: 4.45 MIL/uL (ref 3.70–5.45)
RDW: 15.8 % — ABNORMAL HIGH (ref 11.2–14.5)
WBC: 7.4 10*3/uL (ref 3.9–10.3)

## 2018-05-26 LAB — VITAMIN B12: Vitamin B-12: 273 pg/mL (ref 180–914)

## 2018-05-26 LAB — FERRITIN

## 2018-05-26 LAB — IRON AND TIBC
Iron: 31 ug/dL — ABNORMAL LOW (ref 41–142)
SATURATION RATIOS: 6 % — AB (ref 21–57)
TIBC: 504 ug/dL — AB (ref 236–444)
UIBC: 473 ug/dL

## 2018-05-26 LAB — RETICULOCYTES
RBC.: 4.4 MIL/uL (ref 3.70–5.45)
RETIC COUNT ABSOLUTE: 26.4 10*3/uL — AB (ref 33.7–90.7)
Retic Ct Pct: 0.6 % — ABNORMAL LOW (ref 0.7–2.1)

## 2018-05-26 LAB — FOLATE: Folate: 8.4 ng/mL (ref >5.9)

## 2018-05-26 NOTE — Patient Instructions (Signed)
May 26, 2018 We discussed in detail the results of your prior laboratory studies from August at [facility].  Although your hemoglobin has fluctuated, the most recent hemoglobin from August 16 was 11.0 g/dL.  Your white blood cell was mildly elevated at 11.5.  Your platelet count was slightly elevated at 522,000.  An elevated platelet count frequently accompanies iron deficiency.  If your iron studies suggest iron deficiency, then I would consider iron replacement intravenously.  Because of your history of inflammatory bowel disease, replacing you with oral iron would be problematic.  Because of your history with inflammatory bowel disease, I would also check your nutritional indices consisting of vitamin Z32 and folic acid since these are important cofactors in the production of normal red blood cells, not uncommonly are compromised with inflammatory bowel disease.  I will call you with the results of your laboratory studies from today.  In the absence of obvious iron deficiency, a follow-up appointment has been scheduled on November 1 to discuss those results in thorough detail.  We can always make changes in the schedule depending on the results of today's laboratory studies.  If however, you are iron deficient, I would recommend scheduling you for infusional iron which generally takes only 15 minutes followed by a 30-minute waiting period if necessary.  We would schedule this at your convenience over the telephone.  Barring any unforeseen complications, you was scheduled now for a follow-up lab test and doctor visit on November 1.  Please do not hesitate to call if any questions or problems arise in the interim.  Thank you!   Ladona Ridgel, MD Hematology/Oncology

## 2018-05-26 NOTE — Progress Notes (Signed)
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 She has been receiving golimumab for the past 3 months with markedly improved control.  Her inflammatory bowel disease has in the past been steroid-refractory and difficult to control.  Her most recent flexible sigmoidoscopy was 3 months earlier.  Her primary care provider is Dr. Leighton Ruff.  She is followed also by Dr. Carol Ada, gastroenterology.  Her attending pulmonologist is Dr. Baltazar Apo, [facility].  In April 2018 she underwent a navigational bronchoscopy with needle brushings, biopsies, and forceps biopsies of nodules in the right middle lobe, right lower lobe, and left lower lobe.  They were all negative for malignancy or any infectious cause.   She had a uniformly negative autoimmune screen.  Those nodules  have since resolved spontaneously.  She continues to be followed with serial x-rays as previously arranged.  Because of her long-standing history of inflammatory bowel disease and immunotherapy, laboratory studies were obtained through Dr. Drema Dallas at [place] [facility].  On March 23, 2018 complete blood count showed hemoglobin 12.0 hemoglobin 11.4 hematocrit 34.7 MCV 79.3 MCH 26.1 RDW 16.4 platelets 476,000.  A repeat complete blood count on August 9, showed hemoglobin 10.3 hematocrit 31.1 WBC 12.0 MCV 79.2 MCH 26.3 WBC 12.0 with 56% neutrophils 32% lymphocytes 9% monocytes 3% eosinophils; platelets 498,000.  Her most recent complete blood count from August 16 showed hemoglobin 11.0 hematocrit 33.3 MCV 79.1 MCH 26.2 RDW 16.0 WBC 11.5 with 57% neutrophils 28% lymphocytes 9% monocytes 6% eosinophils 1% basophils; platelets 522,000.  Gabrielle Wilkins has no history of diabetes mellitus, coronary artery disease, or cardiac dysrhythmia.  She has no seizure disorder or stroke syndrome.  There is no thyroid disease.  She has no peptic ulcer or gastroesophageal reflux disease.  She denies viral hepatitis, or diverticulosis.  She has no rheumatoid or gouty arthritis.  She has never taken oral or infusional iron.  She has no peripheral arterial venous thromboembolic disease.  She denies any recent appetite or weight deficit.  She experiences occasional punctate superficial pustules over her trunk which wax and wane.  She has no psoriasis.  She has no visual changes or hearing deficit.  For the past 2-3 days she has had a dry cough without fever, sore throat, orthopnea, shaking chills, or sweats.  She has in the past especially drenching night sweats occurring sometimes 1-2 times monthly since her pulmonary nodules were identified.  She has no heartburn or indigestion.  There is no nausea, vomiting, diarrhea, or constipation.  In the past she has had copious diarrhea stools with uncontrolled flare in her ulcerative proctitis.  The number of bowel movements daily, since being on golimumab has vastly improved.  She currently moves her bowels once daily.  There is no constipation.  She denies melena or bright red blood per rectum.  There is no urinary frequency, urgency, hematuria, or dysuria.  She has no swelling of her ankles.  Gabrielle Wilkins is premenopausal and has relatively regular menstruation every 29-30 days, not uncommonly with heavy flow.  There is no urinary frequency, urgency, hematuria, or dysuria.  She reports no swelling of her ankles.  There are no focal areas of bone, joint, muscle pain.  There is no numbness or tingling in the fingers or toes.  It is with this background she presents now for further diagnostic and therapeutic recommendations in the setting of likely iron deficiency anemia secondary to chronic microscopic blood loss through the lower gastrointestinal tract associated with the underlying chronic inflammatory bowel disease as well as expected blood loss through her menstruation.  Past Medical History:  Diagnosis Date  . Anemia   . Depression    Not for a very LONG time  . Dyspnea   . GERD (gastroesophageal reflux disease)    OCCASIONALLY  . Hx of inverse psoriasis   . Hypertension    Chronic Hypertension  . Migraine, ophthalmoplegic   . Pregnancy induced hypertension   . Ulcerative colitis   . Ulcerative colitis ([place])   Allergic rhinitis Atypical folliculitis Dyslipidemia  Past Surgical History:  Procedure Laterality Date  . CESAREAN SECTION  04/05/2011   Procedure: CESAREAN SECTION;  Surgeon: Logan Bores;  Location: [facility];  Service: Gynecology;  Laterality: N/A;  baby boy 43  APGAR 7/9  . CHOLECYSTECTOMY  2008  . COLONOSCOPY N/A   . DILATION AND CURETTAGE OF UTERUS    . VIDEO BRONCHOSCOPY Bilateral 11/08/2016   Procedure: VIDEO BRONCHOSCOPY WITHOUT FLUORO;  Surgeon: Rush Farmer, MD;  Location: [facility];  Service: Cardiopulmonary;  Laterality: Bilateral;  . VIDEO  BRONCHOSCOPY WITH ENDOBRONCHIAL NAVIGATION N/A 12/15/2016   Procedure: VIDEO BRONCHOSCOPY WITH ENDOBRONCHIAL NAVIGATION;  Surgeon: Collene Gobble, MD;  Location: [facility];  Service: Thoracic;  Laterality: N/A;   Gynecologic History: Avaiyah is a gravida 4 para 3 miscarriage 1 Her first pregnancy was at age 49 years. Her last pregnancy was at age 81 years. Her menarche was at age 71 years. Her last menstruation was 3 weeks earlier. In her early 64s she was on oral contraception for 2 years. Her most recent mammogram was 1 year earlier, reportedly normal. She has never had a breast biopsy. Her last Pap smear was 1 year earlier, reportedly normal.  Family History  Problem Relation Age of Onset  . Pancreatic cancer Mother   . Hypertension Mother   . Cystic kidney disease Mother   . Heart attack Father   . Alcoholism Father   . Cirrhosis Father   . Breast cancer Father   . Hypertension Brother   . Rheum arthritis Brother   . Arthritis/Rheumatoid Brother   . Hypertension Brother   . HIV Brother   . Bipolar disorder Brother   . Hypertension Brother   . Heart attack Other 16       Niece  . Breast cancer Maternal Grandmother   . Breast cancer Paternal Grandmother    Social History   Socioeconomic History  . Marital status: Married    Spouse name: Not on file  . Number of children: Not on file  . Years of education: Not on file  .  Highest education level: Not on file  Occupational History  . Not on file  Social Needs  . Financial resource strain: Not on file  . Food insecurity:    Worry: Not on file    Inability: Not on file  . Transportation needs:    Medical: Not on file    Non-medical: Not on file  Tobacco Use  . Smoking status: Passive Smoke Exposure - Never Smoker  . Smokeless tobacco: Never Used  . Tobacco comment: Father as a child  Substance and Sexual Activity  . Alcohol use: Yes    Alcohol/week: 7.0 standard drinks    Types: 3 Standard drinks or equivalent, 4  Glasses of wine per week  . Drug use: No  . Sexual activity: Yes  Lifestyle  . Physical activity:    Days per week: Not on file    Minutes per session: Not on file  . Stress: Not on file  Relationships  . Social connections:    Talks on phone: Not on file    Gets together: Not on file    Attends religious service: Not on file    Active member of club or organization: Not on file    Attends meetings of clubs or organizations: Not on file    Relationship status: Not on file  . Intimate partner violence:    Fear of current or ex partner: Not on file    Emotionally abused: Not on file    Physically abused: Not on file    Forced sexual activity: Not on file  Other Topics Concern  . Not on file  Social History Narrative   [facility] (12/09/16):   Originally from [place]. She moved to [place] in 1998. 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 No known mold exposure. Does have an outdoor cat & an indoor Geneticist, molecular. No bird exposure. No hot tub exposure. Does have a couple of indoor plants. Enjoys cooking. Married with 3 kids.   Zalaya Astarita is married for the past 19 years. She is a Nurse, mental health for the [employer] She has 3 healthy sons without major medical problems. She is a lifetime non-smoker. She is never used e-cigarettes. She reports no recreational drug use. Her alcohol intake consists of wine 3-4 times weekly.  Transfusion History: No prior transfusion  Exposure History: She has no known exposure to toxic chemicals, radiation, or pesticides. :  Allergies  Allergen Reactions  . Ceclor [Cefaclor] Hives    Patient said she can tolerate penicillin.  Hasn't had Keflex.   No food or seasonal allergies  Current Outpatient Medications on File Prior to Visit  Medication Sig  . lisinopril-hydrochlorothiazide (PRINZIDE,ZESTORETIC) 10-12.5 MG per tablet Take 1 tablet by mouth daily at 6 (six) AM.    No current  facility-administered medications on file prior to visit.   Golimumab every 4 weeks  Review of Systems: Constitutional: No fever, or shaking chills.  Once or twice monthly she has drenching night sweats.  No appetite or weight deficit.  Energy level is stable. Skin: No rash, scaling, sores, lumps, or jaundice. HEENT: No visual changes or hearing deficit. Pulmonary: No sore throat or orthopnea; she has a dry cough for the past 2-3 days.  This occurs not uncommonly with immunotherapy. Breasts: No complaints. Cardiovascular: No coronary artery disease, angina, or myocardial infarction.  No cardiac dysrhythmia, essential hypertension and dyslipidemia. Gastrointestinal: No indigestion, dysphagia, abdominal pain, diarrhea, or constipation.  No change in bowel habits.  Genitourinary: No frequency, urgency, hematuria, or dysuria. Musculoskeletal: No arthralgias or myalgias; no joint swelling, pain, or instability. Hematologic: No bleeding tendency or easy bruisability. Endocrine: No intolerance to heat or cold; no thyroid disease or diabetes mellitus. Vascular: No peripheral arterial or venous thromboembolic disease. Psychological: No anxiety, depression, or mood changes; no mental health illnesses. Neurological: No dizziness, lightheadedness, syncope, or near syncopal episodes; no numbness or tingling in the fingers or toes.  Physical Examination: Vital Signs: Body surface area is 1.61 meters squared.  Vitals:   05/26/18 1304  BP: (!) 142/100  Pulse: 88  Resp: 18  Temp: 98.5 F (36.9 C)  SpO2: 100%    Filed Weights   05/26/18 1304  Weight: 59.6 kg  ECOG PERFORMANCE STATUS: 1 Constitutional:  Jireh Mihalko is fully nourished and developed.  She looks age appropriate.  She is friendly and cooperative without respiratory compromise at rest. Skin: No rashes, scaling, dryness, jaundice, or itching. HEENT: Head is normocephalic and atraumatic.  Pupils are equal round and reactive to light and  accommodation.  Sclerae are anicteric.  Conjunctivae are pink.  No sinus tenderness nor oropharyngeal lesions.  Lips without cracking or peeling; tongue without mass, inflammation, or nodularity.  Mucous membranes are moist. Neck: Supple and symmetric.  No jugular venous distention or thyromegaly.  Trachea is midline. Lymphatics: No cervical or supraclavicular lymphadenopathy.  No epitrochlear, axillary, or inguinal lymphadenopathy is appreciated. Respiratory/chest: Thorax is symmetrical.  Breath sounds are clear to auscultation and percussion.  DOE.  Normal excursion and respiratory effort. Back: Symmetric without deformity or tenderness. Cardiovascular: Heart rate and rhythm are regular without murmurs or gallops. Gastrointestinal: Abdomen is soft, nontender; no organomegaly.  Bowel sounds are normoactive.  No masses are appreciated. Rectal examination: Not performed. Extremities: In the lower extremities, there is no asymmetric swelling, erythema, tenderness, or cord formation.  No clubbing, cyanosis, nor edema. Hematologic: No petechiae, hematomas, or ecchymoses. Psychological She is oriented to person, place, and time; normal affect, memory, and cognition. Neurological: There are no gross neurologic deficits.  Laboratory Results: I have reviewed the data as listed: The results of her laboratory studies from today were not available at the time of discharge.  Some of those results are detailed below.  May 26, 2018: A complete blood count shows hemoglobin 11.2 hematocrit 34.2 MCV 76.9 MCH 25.2 RDW 15.8 WBC 7.4 with 40% neutrophils 38% lymphocytes 17% monocytes 4% eosinophils 1% basophil; platelets 416,000.  Serum iron 31 TIBC 504 iron saturation 6%.  The complete laboratory request from today is pending.  April 14, 2018: A complete blood count shows hemoglobin 11.0 hematocrit 33.3 MCV 79.1 MCH 26.2 RDW 16.0 WBC 11.5 with 57% neutrophils 28% lymphocytes 9% monocytes 6% eosinophils 1%  basophils; platelets 522,000.  March 23, 2018: A comprehensive metabolic panel shows sodium 136 potassium 4.4 chloride 103 CO2 26 glucose 84 BUN 16 creatinine 0.64 calcium 9.7 total protein 7.8 albumin 4.6 total bilirubin 0.3 alkaline phosphatase 52 SGOT 13 SGPT 9.  TSH 2.49 (0.34-4.0).  CBC Latest Ref Rng & Units 12/13/2016 11/10/2016 11/09/2016  WBC 4.0 - 10.5 K/uL 11.4(H) 10.1 10.9(H)  Hemoglobin 12.0 - 15.0 g/dL 11.0(L) 10.6(L) 10.2(L)  Hematocrit 36.0 - 46.0 % 34.0(L) 32.5(L) 31.0(L)  Platelets 150 - 400 K/uL 411(H) 452(H) 392    CMP Latest Ref Rng & Units 12/13/2016 11/10/2016 11/09/2016  Glucose 65 - 99 mg/dL 88 93 94  BUN 6 - 20 mg/dL 12 5(L) <5(L)  Creatinine 0.44 - 1.00 mg/dL 0.65 0.71 0.52  Sodium 135 - 145 mmol/L 138 138 136  Potassium 3.5 - 5.1 mmol/L 4.0 4.0 4.1  Chloride 101 - 111 mmol/L 106 100(L) 103  CO2 22 - 32 mmol/L 25 26 26   Calcium 8.9 - 10.3 mg/dL 9.2 8.9 8.7(L)  Total Protein 6.5 - 8.1 g/dL - 6.9 -  Total Bilirubin 0.3 - 1.2 mg/dL - 0.3 -  Alkaline Phos 38 - 126 U/L - 139(H) -  AST 15 - 41 U/L - 44(H) -  ALT 14 - 54 U/L - 59(H) -   Diagnostic/Imaging Studies: February 16, 2018 CT CHEST WITHOUT CONTRAST  TECHNIQUE: Multidetector CT imaging of the chest was performed following the standard protocol without IV contrast.  COMPARISON:  Prior CT from 05/05/2017  FINDINGS: Cardiovascular: Intrathoracic aorta of normal caliber. Minimal plaque within the aortic arch. Partially visualized great vessels grossly unremarkable. Heart size normal. Coronary artery calcification noted within the LAD. No pericardial effusion. Main pulmonary artery within normal limits for caliber.  Mediastinum/Nodes: Visualized thyroid normal. No mediastinal, hilar, or axillary adenopathy identified on this noncontrast examination. Esophagus within normal limits.  Lungs/Pleura: Tracheobronchial tree intact and patent. Lungs well inflated bilaterally. No focal infiltrates. No  pneumothorax. No pulmonary edema or pleural effusion. Previously seen irregular left lower lobe pulmonary nodules have essentially resolved since previous. Minimal residual atelectasis and/or scarring present at site of prior nodules (series 3, image 113, 119). Scattered subcentimeter 2-3 pulmonary nodules seen involving both lungs are stable from previous. No new or concerning pulmonary nodules identified.  Upper Abdomen: Prior cholecystectomy noted.  Musculoskeletal: No acute osseous abnormality. No discrete lytic or blastic osseous lesions.  IMPRESSION: 1. Interval resolution of previously identified left lower lobe subsolid pulmonary nodules, with minimal residual atelectasis and/or scarring. Findings most likely reflected a previous infectious or inflammatory process. 2. No other acute cardiopulmonary abnormality. No new significant pulmonary nodules or other finding. No adenopathy.  Gabrielle Wilkins M.D. 02/16/2018 19:13  November 07, 2016 CT ANGIOGRAPHY CHEST  CT ABDOMEN AND PELVIS WITH CONTRAST  TECHNIQUE: Multidetector CT imaging of the chest was performed using the standard protocol during bolus administration of intravenous contrast. Multiplanar CT image reconstructions and MIPs were obtained to evaluate the vascular anatomy. Multidetector CT imaging of the abdomen and pelvis was performed using the standard protocol during bolus administration of intravenous contrast.  CONTRAST:  100 cc Isovue 370 IV.  COMPARISON:  None.  FINDINGS: CTA CHEST FINDINGS  Cardiovascular: The study is high quality for the evaluation of pulmonary embolism. There are no filling defects in the central, lobar, segmental or subsegmental pulmonary artery branches to suggest acute pulmonary embolism. Great vessels are normal in course and caliber. Normal heart size. No significant pericardial fluid/thickening.  Mediastinum/Nodes: No discrete thyroid nodules.  Unremarkable esophagus. No pathologically enlarged axillary, mediastinal or hilar lymph nodes.  Lungs/Pleura: No pneumothorax. No pleural effusion. There are numerous (greater than 15) solid round pulmonary nodules scattered throughout both lungs, which demonstrate an irregular contour with small ground-glass halos, largest 1.6 x 1.3 cm in the right middle lobe (series 4/ image 76) and 1.4 x 1.3 cm in the right lower lobe (series 4/ image 80).  Musculoskeletal:  No aggressive appearing focal osseous lesions.  Review of the MIP images confirms the above findings.  CT ABDOMEN and PELVIS FINDINGS  Hepatobiliary: Normal liver with no liver mass. Cholecystectomy. No biliary ductal dilatation.  Pancreas: Normal, with no mass or duct dilation.  Spleen: Normal size. No mass.  Adrenals/Urinary Tract: Normal adrenals. Normal kidneys  with no hydronephrosis and no renal mass. Normal bladder.  Stomach/Bowel: Grossly normal stomach. Normal caliber small bowel with no small bowel wall thickening. Normal appendix. Normal large bowel with no diverticulosis, large bowel wall thickening or pericolonic fat stranding.  Vascular/Lymphatic: Mildly atherosclerotic nonaneurysmal abdominal aorta. Patent portal, splenic, hepatic and renal veins. No pathologically enlarged lymph nodes in the abdomen or pelvis.  Reproductive: Grossly normal retroverted uterus.  No adnexal mass.  Other: No pneumoperitoneum, ascites or focal fluid collection.  Musculoskeletal: No aggressive appearing focal osseous lesions.  Review of the MIP images confirms the above findings.  IMPRESSION: 1. No pulmonary embolism. 2. Numerous irregular round solid pulmonary nodules scattered throughout both lungs with small ground-glass halos, largest 1.6 cm in the right middle lobe. An atypical infectious or inflammatory etiology is suspected. Consider fungal pneumonia, septic emboli or pulmonary vasculitis.  Metastatic disease is less likely, although not entirely excluded. Short term posttreatment follow-up chest CT advised. 3. No lymphadenopathy in the chest, abdomen or pelvis. 4. No acute abnormality in the abdomen or pelvis. 5. Aortic atherosclerosis.  Gabrielle Wilkins M.D. 11/07/2016 07:54  Summary/Assessment: 1.  In the setting of anemia and thrombocytosis, likely secondary to iron deficiency anemia; in the setting of chronic ulcerative colitis (proctitis)  likely secondary to chronic microscopic blood loss through the lower gastrointestinal tract associated with chronic inflammatory bowel disease as well as expected blood loss through her menstruation, currently on immunotherapy with golimumab for the past 3 months.  2. For her ulcerative colitis, Gabrielle Wilkins has been on immunosuppressive therapy in the past with azathioprine, infliximab, and vedolizumab. She has been receiving golimumab for the past 3 months with markedly improved disease control.  Her inflammatory bowel disease has, in the past, been steroid-refractory and difficult to control.    3. Because of her long-standing history of inflammatory bowel disease and immunotherapy, laboratory studies were obtained through Dr. Drema Dallas at [facility].  On March 23, 2018 complete blood count showed hemoglobin 12.0 hemoglobin 11.4 hematocrit 34.7 MCV 79.3 MCH 26.1 RDW 16.4 platelets 476,000.  A repeat complete blood count on August 9, showed hemoglobin 10.3 hematocrit 31.1 WBC 12.0 MCV 79.2 MCH 26.3 WBC 12.0 with 56% neutrophils 32% lymphocytes 9% monocytes 3% eosinophils; platelets 498,000.  Her most recent complete blood count from August 16 showed hemoglobin 11.0 hematocrit 33.3 MCV 79.1 MCH 26.2 RDW 16.0 WBC 11.5 with 57% neutrophils 28% lymphocytes 9% monocytes 6% eosinophils 1% basophils; platelets 522,000.  4. Gabrielle Wilkins has no history of diabetes mellitus, coronary artery disease, or cardiac dysrhythmia.  She has no seizure disorder or  stroke syndrome.  There is no thyroid disease.  She has no peptic ulcer or gastroesophageal reflux disease.  She denies viral hepatitis, or diverticulosis.  She has no rheumatoid or gouty arthritis.  She has never taken oral or received infusional iron.  She has never had a blood transfusion.  She has no peripheral arterial venous thromboembolic disease.    5.  She denies any recent appetite or weight deficit.  She develops occasional punctate superficial, usually solitary, pustules over her trunk which wax and wane.  She has no psoriasis.  She has no visual changes or hearing deficit.  For the past 2-3 days she has had a dry cough without fever, sore throat, orthopnea, shaking chills, or sweats.  She has in the past especially drenching night sweats occurring sometimes 1-2 times monthly since her pulmonary nodules were identified.  She has no heartburn or indigestion.  There  is no nausea, vomiting, diarrhea, or constipation.  In the past she has had copious bloody diarrhea stools with uncontrolled flare in her ulcerative proctitis.  The number of bowel movements daily, since being on golimumab, has vastly decreased.  She currently moves her bowels once daily.  There is no constipation.  She denies melena or bright red blood per rectum.  There is no urinary frequency, urgency, hematuria, or dysuria.  She has no swelling of her ankles.  Gabrielle Wilkins is premenopausal and has relatively regular menstruation every 29-30 days, not uncommonly with heavy flow.  There is no urinary frequency, urgency, hematuria, or dysuria.  She reports no swelling of her ankles.  There are no focal areas of bone, joint, muscle pain.  There is no numbness or tingling in the fingers or toes.  6.  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   Recommendation/Plan: 1.  We had a lengthy discussion regarding the results of your prior laboratory studies in August at [facility].  Although her hemoglobin has fluctuated, the most recent hemoglobin from August 16 at [facility] was 11.0 g/dL.  The white blood cell count was mildly elevated at 11.5.  This is not uncommon in the presence of chronic inflammatory bowel disease.  The automated white blood cell distribution was normal.  2.  Typically, the platelet count is elevated in the presence of iron deficiency anemia.  If on today's laboratory studies (which includes iron studies) there is no evidence of iron deficiency, more extensive evaluation will be performed to exclude an underlying myeloproliferative process.  At the present time the index of suspicion is low given her red blood cell indices (low MCV/MCH).  3.  The results of her laboratory studies were not available at the time of discharge.  Some of those results are detailed above.  Those results preliminarily are relatively stable-to-improved since August.  In fact her white blood cell count has now normalized (7.4).  Her platelets (416,000) have decreased while her hemoglobin/hematocrit remain stable (11.2/34.2).  The results of her iron studies and nutritional indices are pending.  4.  Because of the underlying history of inflammatory bowel disease, both vitamin M01 and folic acid have been requested since they are important cofactors in the production of normal red blood cells, and are not uncommonly low in the presence of active inflammatory bowel disease.  Those results are pending.  5.  In the absence of obvious iron deficiency a follow-up appointment has been scheduled on November 1 to discuss the results in thorough detail.  She can call us for the results of her laboratory studies from today within one business day  unless notified sooner.  6.  If however, she is iron deficient, I would recommend parenteral iron given the chronic underlying ulcerative colitis and associated risk of a flare reaction with oral iron.  We discussed briefly the logistics and rationale for parenteral iron replacement.  Although we did discuss the potential side effect profile of parenteral iron, which includes uncommonly dizziness, lightheadedness, allergic reaction, and irritation at the site of venipuncture, it is generally well-tolerated.  37.  Camella was advised to call us in the interim should any new or untoward problems arise.  The total time spent discussing the the previous laboratory studies performed on August 16,  the implications of low red blood cell parameters, that is MCV and MCH, methodology for evaluating anemia of likely iron deficiency origin given her history,  preliminary considerations with recommendations and plan was 50 minutes.  At least 50% of that time was spent in discussion, reviewing outside records, laboratory evaluation, counseling, and answering questions. All questions were answered to her satisfaction.  She knows to call the Clinic with any problems, questions, or concerns.  This note was dictated using voice activated technology/software.  Unfortunately, typographical errors are not uncommon, and transcription is subject to mistakes and regrettably misinterpretation.  If necessary, clarification of the above information can be discussed with me at any time.  Thank you Dr. Drema Dallas for allowing my participation in the care of Gabrielle Wilkins. I will keep you closely informed as the results of her preliminary laboratory data become available.  Please do not hesitate to call should any questions arise regarding this initial consultation and discussion.  FOLLOW UP: AS DIRECTED   cc:        Leighton Ruff, MD              Carol Ada, MD              Baltazar Apo, MD                Henreitta Leber, MD   Hematology/Oncology [facility] [address] [place], [place] [address] Office: [phone] YELY: [phone]

## 2018-05-28 ENCOUNTER — Other Ambulatory Visit: Payer: Self-pay | Admitting: Hematology and Oncology

## 2018-05-29 ENCOUNTER — Other Ambulatory Visit: Payer: Self-pay | Admitting: Hematology and Oncology

## 2018-05-29 ENCOUNTER — Telehealth: Payer: Self-pay | Admitting: Hematology and Oncology

## 2018-05-29 ENCOUNTER — Telehealth: Payer: Self-pay | Admitting: *Deleted

## 2018-05-29 DIAGNOSIS — D539 Nutritional anemia, unspecified: Secondary | ICD-10-CM

## 2018-05-29 DIAGNOSIS — K51211 Ulcerative (chronic) proctitis with rectal bleeding: Secondary | ICD-10-CM

## 2018-05-29 DIAGNOSIS — D500 Iron deficiency anemia secondary to blood loss (chronic): Secondary | ICD-10-CM

## 2018-05-29 NOTE — Telephone Encounter (Signed)
-----   Message from Henreitta Leber, MD sent at 05/29/2018  8:50 AM EDT ----- Regarding: She needs a phone call! Hello Gabrielle Wilkins, This lady needs a phone call (cell: [phone]/H: [phone] (. She can be told that her iron level is low and that iron replacement intravenously is indicated. She will not be terribly surprised. Because both her vitamin X90 and folic acid levels are borderline (she has ulcerative colitis), I recommended also that both methylmalonic acid and homocysteine be obtained when she does come in for Feraheme.  She needs to be scheduled for iron infusion. She will need labs whenever she agrees to come in for iron. Although she had a return date scheduled for November 1, please change that and schedule her out 4 weeks to see me after her dose of Feraheme; same labs as were previously requested. Just schedule her and let me know. If she has any questions, I will call her back.  Thank you Gabrielle Wilkins

## 2018-05-29 NOTE — Telephone Encounter (Signed)
Notified of message below. Verbalized understanding.   Message to scheduler.

## 2018-05-29 NOTE — Telephone Encounter (Signed)
Scheduled appt per 9/30 sch message - pt is aware of appt date and time.

## 2018-06-02 ENCOUNTER — Inpatient Hospital Stay: Payer: BC Managed Care – PPO | Attending: Hematology and Oncology

## 2018-06-02 ENCOUNTER — Inpatient Hospital Stay: Payer: BC Managed Care – PPO

## 2018-06-02 VITALS — BP 128/98 | HR 76 | Temp 98.5°F | Resp 16

## 2018-06-02 DIAGNOSIS — D500 Iron deficiency anemia secondary to blood loss (chronic): Secondary | ICD-10-CM | POA: Insufficient documentation

## 2018-06-02 DIAGNOSIS — K5190 Ulcerative colitis, unspecified, without complications: Secondary | ICD-10-CM | POA: Diagnosis not present

## 2018-06-02 DIAGNOSIS — K51211 Ulcerative (chronic) proctitis with rectal bleeding: Secondary | ICD-10-CM

## 2018-06-02 DIAGNOSIS — D539 Nutritional anemia, unspecified: Secondary | ICD-10-CM

## 2018-06-02 MED ORDER — SODIUM CHLORIDE 0.9 % IV SOLN
510.0000 mg | Freq: Once | INTRAVENOUS | Status: AC
  Administered 2018-06-02: 510 mg via INTRAVENOUS
  Filled 2018-06-02: qty 17

## 2018-06-02 MED ORDER — SODIUM CHLORIDE 0.9 % IV SOLN
Freq: Once | INTRAVENOUS | Status: AC
  Administered 2018-06-02: 15:00:00 via INTRAVENOUS
  Filled 2018-06-02: qty 250

## 2018-06-02 MED ORDER — SODIUM CHLORIDE 0.9% FLUSH
10.0000 mL | Freq: Once | INTRAVENOUS | Status: AC | PRN
  Filled 2018-06-02: qty 10

## 2018-06-02 MED ORDER — HEPARIN SOD (PORK) LOCK FLUSH 100 UNIT/ML IV SOLN
500.0000 [IU] | Freq: Once | INTRAVENOUS | Status: AC | PRN
  Filled 2018-06-02: qty 5

## 2018-06-02 NOTE — Patient Instructions (Signed)

## 2018-06-04 LAB — HOMOCYSTEINE: Homocysteine: 17 umol/L — ABNORMAL HIGH (ref 0.0–15.0)

## 2018-06-07 LAB — METHYLMALONIC ACID, SERUM: METHYLMALONIC ACID, QUANTITATIVE: 151 nmol/L (ref 0–378)

## 2018-06-30 ENCOUNTER — Telehealth: Payer: Self-pay | Admitting: Hematology and Oncology

## 2018-06-30 ENCOUNTER — Inpatient Hospital Stay: Payer: BC Managed Care – PPO | Attending: Hematology and Oncology

## 2018-06-30 ENCOUNTER — Other Ambulatory Visit: Payer: Self-pay

## 2018-06-30 ENCOUNTER — Encounter: Payer: Self-pay | Admitting: Hematology and Oncology

## 2018-06-30 ENCOUNTER — Inpatient Hospital Stay (HOSPITAL_BASED_OUTPATIENT_CLINIC_OR_DEPARTMENT_OTHER): Payer: BC Managed Care – PPO | Admitting: Hematology and Oncology

## 2018-06-30 VITALS — BP 128/94 | HR 71 | Temp 98.6°F | Resp 18 | Ht 62.0 in | Wt 136.5 lb

## 2018-06-30 DIAGNOSIS — D500 Iron deficiency anemia secondary to blood loss (chronic): Secondary | ICD-10-CM

## 2018-06-30 DIAGNOSIS — E538 Deficiency of other specified B group vitamins: Secondary | ICD-10-CM | POA: Insufficient documentation

## 2018-06-30 DIAGNOSIS — I10 Essential (primary) hypertension: Secondary | ICD-10-CM | POA: Diagnosis not present

## 2018-06-30 DIAGNOSIS — K219 Gastro-esophageal reflux disease without esophagitis: Secondary | ICD-10-CM | POA: Insufficient documentation

## 2018-06-30 DIAGNOSIS — R918 Other nonspecific abnormal finding of lung field: Secondary | ICD-10-CM | POA: Insufficient documentation

## 2018-06-30 DIAGNOSIS — R61 Generalized hyperhidrosis: Secondary | ICD-10-CM | POA: Diagnosis not present

## 2018-06-30 DIAGNOSIS — E785 Hyperlipidemia, unspecified: Secondary | ICD-10-CM

## 2018-06-30 DIAGNOSIS — K5120 Ulcerative (chronic) proctitis without complications: Secondary | ICD-10-CM

## 2018-06-30 DIAGNOSIS — D509 Iron deficiency anemia, unspecified: Secondary | ICD-10-CM | POA: Insufficient documentation

## 2018-06-30 DIAGNOSIS — R5383 Other fatigue: Secondary | ICD-10-CM | POA: Diagnosis not present

## 2018-06-30 DIAGNOSIS — Z79899 Other long term (current) drug therapy: Secondary | ICD-10-CM | POA: Insufficient documentation

## 2018-06-30 DIAGNOSIS — I700 Atherosclerosis of aorta: Secondary | ICD-10-CM

## 2018-06-30 DIAGNOSIS — D473 Essential (hemorrhagic) thrombocythemia: Secondary | ICD-10-CM

## 2018-06-30 DIAGNOSIS — D539 Nutritional anemia, unspecified: Secondary | ICD-10-CM

## 2018-06-30 LAB — CBC WITH DIFFERENTIAL (CANCER CENTER ONLY)
Abs Immature Granulocytes: 0.02 10*3/uL (ref 0.00–0.07)
BASOS PCT: 1 %
Basophils Absolute: 0 10*3/uL (ref 0.0–0.1)
EOS PCT: 5 %
Eosinophils Absolute: 0.4 10*3/uL (ref 0.0–0.5)
HCT: 35.9 % — ABNORMAL LOW (ref 36.0–46.0)
HEMOGLOBIN: 11.6 g/dL — AB (ref 12.0–15.0)
Immature Granulocytes: 0 %
Lymphocytes Relative: 39 %
Lymphs Abs: 3 10*3/uL (ref 0.7–4.0)
MCH: 26.8 pg (ref 26.0–34.0)
MCHC: 32.3 g/dL (ref 30.0–36.0)
MCV: 82.9 fL (ref 80.0–100.0)
MONO ABS: 0.6 10*3/uL (ref 0.1–1.0)
MONOS PCT: 8 %
Neutro Abs: 3.6 10*3/uL (ref 1.7–7.7)
Neutrophils Relative %: 47 %
PLATELETS: 356 10*3/uL (ref 150–400)
RBC: 4.33 MIL/uL (ref 3.87–5.11)
RDW: 18.2 % — ABNORMAL HIGH (ref 11.5–15.5)
WBC: 7.7 10*3/uL (ref 4.0–10.5)
nRBC: 0 % (ref 0.0–0.2)

## 2018-06-30 LAB — IRON AND TIBC
Iron: 122 ug/dL (ref 41–142)
SATURATION RATIOS: 35 % (ref 21–57)
TIBC: 354 ug/dL (ref 236–444)
UIBC: 232 ug/dL (ref 120–384)

## 2018-06-30 LAB — FERRITIN: FERRITIN: 62 ng/mL (ref 11–307)

## 2018-06-30 MED ORDER — FOLIC ACID 1 MG PO TABS: 1.0000 mg | ORAL_TABLET | Freq: Every day | ORAL | 2 refills | Status: AC

## 2018-06-30 NOTE — Patient Instructions (Signed)
We discussed in detail the results of your laboratory studies from today and at the time of your previous visit on September 27.  Those initial results suggest iron deficiency anemia and some degree of folic acid deficit.  Your hemoglobin and red blood cell indices have improved.  Following your first iron infusion, a second iron infusion has been scheduled on November 13.  They will call you for a specific time.  You can start folic acid: 1 mg once daily with food.  A prescription was sent to your pharmacy.  If no problems occur with oral folic acid, begin vitamin B 50 complex: 1 tablet once daily with food.  Barring any unforeseen complications, your next scheduled doctor visit with laboratory studies is on December 13.  Please do not hesitate to call should any new or untoward problems arise.  Always the best! Ladona Ridgel, MD Hematology/Oncology

## 2018-06-30 NOTE — Progress Notes (Signed)
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 She has been receiving golimumab for the past 3 months with markedly improved control.  Her inflammatory bowel disease has in the past been steroid-refractory and difficult to control.  Her most recent flexible sigmoidoscopy was 3 months earlier.  Her primary care provider is Dr. Leighton Ruff.  She is followed also by Dr. Carol Ada, gastroenterology.  Her attending pulmonologist is Dr. Baltazar Apo, [facility].  In April 2018 she underwent a navigational bronchoscopy with needle brushings, biopsies, and forceps biopsies of nodules in the right middle lobe, right lower lobe, and left lower lobe.  They were all negative for malignancy or any infectious cause.   She had a uniformly negative autoimmune screen.  Those nodules have since resolved spontaneously.  She continues to be followed with serial x-rays as  previously arranged.  Because of her long-standing history of inflammatory bowel disease and immunotherapy, laboratory studies were obtained through Dr. Drema Dallas at [place] at [facility].  On March 23, 2018 complete blood count showed hemoglobin 12.0 hemoglobin 11.4 hematocrit 34.7 MCV 79.3 MCH 26.1 RDW 16.4 platelets 476,000.  A repeat complete blood count on August 9, showed hemoglobin 10.3 hematocrit 31.1 WBC 12.0 MCV 79.2 MCH 26.3 WBC 12.0 with 56% neutrophils 32% lymphocytes 9% monocytes 3% eosinophils; platelets 498,000.  Her most recent complete blood count from August 16 showed hemoglobin 11.0 hematocrit 33.3 MCV 79.1 MCH 26.2 RDW 16.0 WBC 11.5 with 57% neutrophils 28% lymphocytes 9% monocytes 6% eosinophils 1% basophils; platelets 522,000.  Because of her ulcerative colitis, she was instructed not to take oral iron. She experiences occasional punctate superficial pustules over her trunk which wax and wane.  She has no psoriasis. She has in the past especially drenching night sweats occurring sometimes 1-2 times monthly since her pulmonary nodules were identified. The number of bowel movements daily, since being on golimumab has vastly improved. In the past she has had copious diarrhea stools with uncontrolled flare in her ulcerative proctitis.  Following infusional iron on October 15, she had no adverse reaction or effects.  For ulcerative colitis, [name] has been on immunosuppressive therapy in the past with azathioprine, infliximab, and vedolizumab. She has been receiving golimumab for the past 4 months with markedly improved disease control.  Her inflammatory bowel disease has, in the past, been steroid-refractory and difficult to control.  At the time of our initial visit on September 27, we reviewed the prior results from [facility] in August. Although her hemoglobin has fluctuated, the most recent hemoglobin from August 16 at [facility] was 11.0 g/dL.  The white blood cell count was mildly  elevated at 11.5.  This  is not uncommon in the presence of chronic inflammatory bowel disease.  The automated white blood cell distribution was normal.  We discussed in the typically the platelet count is elevated in the presence of iron deficiency anemia.  The index of suspicion was extremely low given her red blood cell indices for an underlying myeloproliferative process.  The results of her complete blood count are relatively stable-to-improved since August.  In fact her white count has now normalized (7.4).  Her platelets have decreased while her hemoglobin/hematocrit remain relatively stable (11.2/34.2%).  Because her serum ferritin was <4, she was given parenteral iron on October 4.  The results of her complete blood count and ferritin from today are detailed below.  Because of inflammatory bowel disease, both vitamin S96 and folic acid were requested since they are important cofactors in the production of normal red blood cells.  Although low normal, the metabolites methylmalonic acid and serum homocysteine were also requested.  Methylmalonic acid was normal.  Serum homocysteine however was mildly elevated.  Since receiving parenteral iron, she does admit to being more energetic and less fatigued.  She is otherwise asymptomatic. It is with this background she presents now for the results of her preliminary evaluation and recommendations in the setting of iron deficiency anemia, likely secondary to chronic microscopic blood loss through the lower gastrointestinal tract associated with chronic inflammatory bowel disease and monthly menstruation and consequent blood loss as outlined above.    In the interim since her last visit she was treated for a upper respiratory tract infection with antibiotics by Dr. Drema Dallas. She has recovered completely.  She denies any recent appetite or weight deficit. She has no visual changes or hearing deficit.  She has no dizziness, lightheadedness, syncope, or near syncopal  episodes.  Her overall energy level is gradually improving since infusional iron.  No fever, sore throat, orthopnea, shaking chills, or sweats are evident.  She has no heartburn or indigestion. There is no nausea, vomiting, diarrhea, or constipation. She currently moves her bowels once daily. There is no constipation.  She denies melena or bright red blood per rectum. There is no urinary frequency, urgency, hematuria, or dysuria.  She has no swelling of her ankles. Kambri is premenopausal and has relatively regular menstruation every 29-30 days, not uncommonly with heavy flow. She reports no swelling of her ankles. There are no focal areas of bone, joint, muscle pain. There is no numbness or tingling in the fingers or toes.  Past Medical History:  Diagnosis Date  . Anemia   . Depression    Not for a very LONG time  . Dyspnea   . GERD (gastroesophageal reflux disease)    OCCASIONALLY  . Hx of inverse psoriasis   . Hypertension    Chronic Hypertension  . Migraine, ophthalmoplegic   . Pregnancy induced hypertension   . Ulcerative colitis   . Ulcerative colitis ([place])   Allergic rhinitis Atypical folliculitis Dyslipidemia  Past Medical History Reviewed        Family History Reviewed       Social History Reviewed  Allergies  Allergen Reactions  . Ceclor [Cefaclor] Hives    Patient said she can tolerate penicillin.  Hasn't had Keflex.   No food or seasonal allergies  Current Outpatient Medications on File Prior to Visit  Medication Sig  . lisinopril-hydrochlorothiazide (PRINZIDE,ZESTORETIC) 10-12.5 MG per tablet Take 1 tablet by mouth daily at 6 (six) AM.    Current Facility-Administered Medications on File Prior to Visit  Medication  . heparin lock flush 100 unit/mL  . sodium chloride flush (NS) 0.9 % injection 10 mL    Review of Systems: Constitutional: No fever, or shaking chills.  Once or twice monthly she has drenching night sweats.  No appetite or weight deficit.   Energy level is stable. Skin: No rash, scaling, sores, lumps, or jaundice. HEENT: No visual changes or hearing deficit. Pulmonary: No cough, sore throat, orthopnea.  Breasts: No complaints. Cardiovascular: No coronary artery disease, angina, or myocardial infarction.  No cardiac dysrhythmia, essential hypertension and dyslipidemia. Gastrointestinal: No indigestion, dysphagia, abdominal pain, diarrhea, or constipation.  No change in bowel habits; GERD; ulcerative colitis (proctitis). Genitourinary: No urinary frequency, urgency, hematuria, or dysuria. Musculoskeletal: No arthralgias or myalgias; no joint swelling, pain, or instability. Hematologic: No bleeding tendency or easy bruisability. Endocrine: No intolerance to heat or cold; no thyroid disease or diabetes mellitus. Vascular: No peripheral arterial or venous thromboembolic disease. Psychological: No anxiety, depression, or mood changes; no mental health illnesses. Neurological: Infrequent migraine headache (ophthalmoplegia). No dizziness, lightheadedness, syncope, or near syncopal episodes; no numbness or tingling in the fingers or toes.  Physical Examination: Vital Signs: Body surface area is 1.65 meters squared.  Vitals:   06/30/18 1124  BP: (!) 128/94  Pulse: 71  Resp: 18  Temp: 98.6 F (37 C)  SpO2: 100%    Filed Weights   06/30/18 1124  Weight: 136 lb 8 oz (61.9 kg)  Body mass index is 24.97 kg/m. ECOG PERFORMANCE STATUS: 0-1 Constitutional:  Waverly Tarquinio is fully nourished and developed.  She looks age appropriate.  She is friendly and cooperative without respiratory compromise at rest. Skin: No rashes, scaling, dryness, jaundice, or itching. HEENT: Head is normocephalic and atraumatic.  Pupils are equal round and reactive to light and accommodation.  Sclerae are anicteric.  Conjunctivae are pink.  No sinus tenderness nor oropharyngeal lesions.  Lips without cracking or peeling; tongue without mass, inflammation, or  nodularity.  Mucous membranes are moist. Neck: Supple and symmetric.  No jugular venous distention or thyromegaly.  Trachea is midline. Lymphatics: No cervical or supraclavicular lymphadenopathy.  No epitrochlear, axillary, or inguinal lymphadenopathy is appreciated. Respiratory/chest: Thorax is symmetrical.  Breath sounds are clear to auscultation and percussion.  DOE.  Normal excursion and respiratory effort. Back: Symmetric without deformity or tenderness. Cardiovascular: Heart rate and rhythm are regular without murmurs or gallops. Gastrointestinal: Abdomen is soft, nontender; no organomegaly.  Bowel sounds are normoactive.  No masses are appreciated. Extremities: In the lower extremities, there is no asymmetric swelling, erythema, tenderness, or cord formation.  No clubbing, cyanosis, nor edema. Hematologic: No petechiae, hematomas, or ecchymoses. Psychological She is oriented to person, place, and time; normal affect, memory, and cognition. Neurological: There are no gross neurologic deficits.  Laboratory Results: June 30, 2018  Ref Range & Units 1d ago (06/30/18) 63moago (05/26/18) 169yrgo (12/13/16) 1y18yro (11/10/16) 25yr30yr (11/09/16)  WBC Count 4.0 - 10.5 K/uL 7.7  7.4 R 11.4High   10.1  10.9High    RBC 3.87 - 5.11 MIL/uL 4.33  4.45 R 4.13  3.80Low   3.62Low    Hemoglobin 12.0 - 15.0 g/dL 11.6Low   11.2Low  R 11.0Low   10.6Low   10.2Low    HCT 36.0 - 46.0 % 35.9Low   34.2Low  R 34.0Low   32.5Low   31.0Low    MCV 80.0 - 100.0 fL 82.9  76.9Low  R 82.3 R 85.5 R 85.6 R  MCH 26.0 -  34.0 pg 26.8  25.2 R 26.6  27.9  28.2   MCHC 30.0 - 36.0 g/dL 32.3  32.8 R 32.4  32.6  32.9   RDW 11.5 - 15.5 % 18.2High   15.8High  R 13.9  13.8  13.7   Platelet Count 150 - 400 K/uL 356  416High  R 411High   452High   392   nRBC 0.0 - 0.2 % 0.0       Neutrophils Relative % % 47  40      Neutro Abs 1.7 - 7.7 K/uL 3.6  3.0 R     Lymphocytes Relative % 39  38      Lymphs Abs 0.7 - 4.0 K/uL 3.0  2.8 R      Monocytes Relative % 8  17      Monocytes Absolute 0.1 - 1.0 K/uL 0.6  1.2High  R     Eosinophils Relative % 5  4      Eosinophils Absolute 0.0 - 0.5 K/uL 0.4  0.3      Basophils Relative % 1  1      Basophils Absolute 0.0 - 0.1 K/uL 0.0  0.1 CM     Immature Granulocytes % 0       Abs Immature Granulocytes 0.00 - 0.07 K/uL 0.02           Iron/TIBC 122/354 Iron saturation 35% Ferritin 62  May 26, 2018  Ref Range & Units 85moago (05/26/18) 163yrgo (12/13/16) 1y7yro (11/10/16) 99yr799yr (11/09/16) 99yr 899yr(11/07/16)  WBC Count 3.9 - 10.3 K/uL 7.4  11.4High  R 10.1 R 10.9High  R 10.8High  R  RBC 3.70 - 5.45 MIL/uL 4.45  4.13 R 3.80Low  R 3.62Low  R 3.54Low  R  Hemoglobin 11.6 - 15.9 g/dL 11.2Low   11.0Low  R 10.6Low  R 10.2Low  R 9.8Low  R  HCT 34.8 - 46.6 % 34.2Low   34.0Low  R 32.5Low  R 31.0Low  R 30.0Low  R  MCV 79.5 - 101.0 fL 76.9Low   82.3 R 85.5 R 85.6 R 84.7 R  MCH 25.1 - 34.0 pg 25.2  26.6 R 27.9 R 28.2 R 27.7 R  MCHC 31.5 - 36.0 g/dL 32.8  32.4 R 32.6 R 32.9 R 32.7 R  RDW 11.2 - 14.5 % 15.8High   13.9 R 13.8 R 13.7 R 13.6 R  Platelet Count 145 - 400 K/uL 416High   411High  R 452High  R 392 R 410High  R  Neutrophils Relative % % 40       Neutro Abs 1.5 - 6.5 K/uL 3.0       Lymphocytes Relative % 38       Lymphs Abs 0.9 - 3.3 K/uL 2.8       Monocytes Relative % 17       Monocytes Absolute 0.1 - 0.9 K/uL 1.2High        Eosinophils Relative % 4       Eosinophils Absolute 0.0 - 0.5 K/uL 0.3       Basophils Relative % 1       Basophils Absolute 0.0 - 0.1 K/uL 0.1       Comment: Performed at [facility], [id number] count 0.6% Vitamin B12 [id number] acid 8.4 Iron/TIBC 31/504 Iron saturation 6% Serum ferritin <4 Homocysteine 17.0 (0-15) Methylmalonic acid 151  May 26, 2018: A complete blood count shows  hemoglobin 11.2 hematocrit 34.2 MCV 76.9 MCH 25.2 RDW 15.8 WBC 7.4 with 40% neutrophils  38% lymphocytes 17% monocytes 4% eosinophils 1% basophil; platelets 416,000.  Serum iron 31 TIBC 504 iron saturation 6%.  The complete laboratory request from today is pending.  April 14, 2018: A complete blood count shows hemoglobin 11.0 hematocrit 33.3 MCV 79.1 MCH 26.2 RDW 16.0 WBC 11.5 with 57% neutrophils 28% lymphocytes 9% monocytes 6% eosinophils 1% basophils; platelets 522,000.  March 23, 2018: A comprehensive metabolic panel shows sodium 136 potassium 4.4 chloride 103 CO2 26 glucose 84 BUN 16 creatinine 0.64 calcium 9.7 total protein 7.8 albumin 4.6 total bilirubin 0.3 alkaline phosphatase 52 SGOT 13 SGPT 9.  TSH 2.49 (0.34-4.0).  CMP Latest Ref Rng & Units 12/13/2016 11/10/2016 11/09/2016  Glucose 65 - 99 mg/dL 88 93 94  BUN 6 - 20 mg/dL 12 5(L) <5(L)  Creatinine 0.44 - 1.00 mg/dL 0.65 0.71 0.52  Sodium 135 - 145 mmol/L 138 138 136  Potassium 3.5 - 5.1 mmol/L 4.0 4.0 4.1  Chloride 101 - 111 mmol/L 106 100(L) 103  CO2 22 - 32 mmol/L 25 26 26   Calcium 8.9 - 10.3 mg/dL 9.2 8.9 8.7(L)  Total Protein 6.5 - 8.1 g/dL - 6.9 -  Total Bilirubin 0.3 - 1.2 mg/dL - 0.3 -  Alkaline Phos 38 - 126 U/L - 139(H) -  AST 15 - 41 U/L - 44(H) -  ALT 14 - 54 U/L - 59(H) -   Diagnostic/Imaging Studies: February 16, 2018 CT CHEST WITHOUT CONTRAST  TECHNIQUE: Multidetector CT imaging of the chest was performed following the standard protocol without IV contrast.  COMPARISON: Prior CT from 05/05/2017  FINDINGS: Cardiovascular: Intrathoracic aorta of normal caliber. Minimal plaque within the aortic arch. Partially visualized great vessels grossly unremarkable. Heart size normal. Coronary artery calcification noted within the LAD. No pericardial effusion. Main pulmonary artery within normal limits for caliber.  Mediastinum/Nodes: Visualized thyroid normal. No mediastinal, hilar, or axillary adenopathy identified on this noncontrast examination. Esophagus within normal limits.  Lungs/Pleura:  Tracheobronchial tree intact and patent. Lungs well inflated bilaterally. No focal infiltrates. No pneumothorax. No pulmonary edema or pleural effusion. Previously seen irregular left lower lobe pulmonary nodules have essentially resolved since previous. Minimal residual atelectasis and/or scarring present at site of prior nodules (series 3, image 113, 119). Scattered subcentimeter 2-3 pulmonary nodules seen involving both lungs are stable from previous. No new or concerning pulmonary nodules identified.  Upper Abdomen: Prior cholecystectomy noted.  Musculoskeletal: No acute osseous abnormality. No discrete lytic or blastic osseous lesions.  IMPRESSION: 1. Interval resolution of previously identified left lower lobe subsolid pulmonary nodules, with minimal residual atelectasis and/or scarring. Findings most likely reflected a previous infectious or inflammatory process. 2. No other acute cardiopulmonary abnormality. No new significant pulmonary nodules or other finding. No adenopathy.  Jeannine Boga M.D. 02/16/2018 19:13  November 07, 2016 CT ANGIOGRAPHY CHEST  CT ABDOMEN AND PELVIS WITH CONTRAST  TECHNIQUE: Multidetector CT imaging of the chest was performed using the standard protocol during bolus administration of intravenous contrast. Multiplanar CT image reconstructions and MIPs were obtained to evaluate the vascular anatomy. Multidetector CT imaging of the abdomen and pelvis was performed using the standard protocol during bolus administration of intravenous contrast.  CONTRAST: 100 cc Isovue 370 IV.  COMPARISON: None.  FINDINGS: CTA CHEST FINDINGS  Cardiovascular: The study is high quality for the evaluation of pulmonary embolism. There are no filling defects in the central, lobar, segmental or subsegmental pulmonary artery branches  to suggest acute pulmonary embolism. Great vessels are normal in course and caliber. Normal heart size. No  significant pericardial fluid/thickening.  Mediastinum/Nodes: No discrete thyroid nodules. Unremarkable esophagus. No pathologically enlarged axillary, mediastinal or hilar lymph nodes.  Lungs/Pleura: No pneumothorax. No pleural effusion. There are numerous (greater than 15) solid round pulmonary nodules scattered throughout both lungs, which demonstrate an irregular contour with small ground-glass halos, largest 1.6 x 1.3 cm in the right middle lobe (series 4/ image 76) and 1.4 x 1.3 cm in the right lower lobe (series 4/ image 80).  Musculoskeletal: No aggressive appearing focal osseous lesions.  Review of the MIP images confirms the above findings.  CT ABDOMEN and PELVIS FINDINGS  Hepatobiliary: Normal liver with no liver mass. Cholecystectomy. No biliary ductal dilatation.  Pancreas: Normal, with no mass or duct dilation.  Spleen: Normal size. No mass.  Adrenals/Urinary Tract: Normal adrenals. Normal kidneys with no hydronephrosis and no renal mass. Normal bladder.  Stomach/Bowel: Grossly normal stomach. Normal caliber small bowel with no small bowel wall thickening. Normal appendix. Normal large bowel with no diverticulosis, large bowel wall thickening or pericolonic fat stranding.  Vascular/Lymphatic: Mildly atherosclerotic nonaneurysmal abdominal aorta. Patent portal, splenic, hepatic and renal veins. No pathologically enlarged lymph nodes in the abdomen or pelvis.  Reproductive: Grossly normal retroverted uterus. No adnexal mass.  Other: No pneumoperitoneum, ascites or focal fluid collection.  Musculoskeletal: No aggressive appearing focal osseous lesions.  Review of the MIP images confirms the above findings.  IMPRESSION: 1. No pulmonary embolism. 2. Numerous irregular round solid pulmonary nodules scattered throughout both lungs with small ground-glass halos, largest 1.6 cm in the right middle lobe. An atypical infectious or  inflammatory etiology is suspected. Consider fungal pneumonia, septic emboli or pulmonary vasculitis. Metastatic disease is less likely, although not entirely excluded. Short term posttreatment follow-up chest CT advised. 3. No lymphadenopathy in the chest, abdomen or pelvis. 4. No acute abnormality in the abdomen or pelvis. 5. Aortic atherosclerosis.  Ilona Sorrel M.D. 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  She has been receiving golimumab for the past 4 months with markedly improved control of her ulcerative proctitis.  Her inflammatory bowel disease has chronic, in the past, been steroid-refractory and difficult to control.  Her most recent flexible sigmoidoscopy was 3 months earlier. In April 2018 she underwent a navigational bronchoscopy with needle brushings, biopsies, and forceps biopsies of nodules in the right middle lobe, right lower lobe, and left lower lobe.  They were all negative for malignancy or any infectious cause.   She had a uniformly negative autoimmune screen. Those nodules have since resolved spontaneously. She continues to be followed with serial x-rays as previously arranged.  Because of her long-standing history of inflammatory bowel disease and immunotherapy, laboratory studies were obtained through Dr. Drema Dallas at [place] [facility]  On March 23, 2018 complete blood count showed hemoglobin 12.0 hemoglobin 11.4 hematocrit 34.7 MCV 79.3 MCH 26.1 RDW 16.4 platelets 476,000.  A repeat complete blood count on August 9, showed hemoglobin 10.3 hematocrit 31.1 WBC 12.0 MCV 79.2 MCH 26.3 WBC 12.0 with 56% neutrophils 32% lymphocytes 9% monocytes 3% eosinophils;  platelets 498,000.  Her most recent complete blood count from August 16 showed hemoglobin 11.0 hematocrit 33.3 MCV  79.1 MCH 26.2 RDW 16.0 WBC 11.5 with 57% neutrophils 28% lymphocytes 9% monocytes 6% eosinophils 1% basophils; platelets 522,000.  Because of her ulcerative colitis, she was instructed not to take oral iron. She experiences occasional punctate superficial pustules over her trunk which wax and wane.  She has no psoriasis. She has in the past especially drenching night sweats occurring sometimes 1-2 times monthly since her pulmonary nodules were identified. The number of bowel movements daily, since being on golimumab has vastly improved. In the past she has had copious diarrhea stools with uncontrolled flare in her ulcerative proctitis.  Following infusional iron on October 15, she had no adverse reaction or effects.  For ulcerative colitis, Heavenlee has been on immunosuppressive therapy in the past with azathioprine, infliximab, and vedolizumab. She has been receiving golimumab for the past 4 months with markedly improved disease control.  Her inflammatory bowel disease has, in the past, been steroid-refractory and difficult to control.  At the time of our initial visit on September 27, we reviewed the prior results from [facility] in August. Although her hemoglobin has fluctuated, the hemoglobin from August 16 at [facility] was 11.0 g/dL.  The white blood cell count was mildly elevated at 11.5.  This is not uncommon in the presence of chronic inflammatory bowel disease.  The automated white blood cell distribution was normal.  We discussed in the typically the platelet count is elevated in the presence of iron deficiency anemia.  The index of suspicion was extremely low given her red blood cell indices for an underlying myeloproliferative process.  The results of her complete blood count are relatively stable-to-improved since August.  In fact her white count has now normalized (7.4).  Her  platelets have decreased while her hemoglobin/hematocrit remain relatively stable (11.2/34.2%).  Because her serum ferritin was <4, she was given parenteral iron on October 4.  Her hemoglobin has increased modestly.  Her platelet count is now normal. The results of her complete blood count and ferritin from today are detailed below.  Because of inflammatory bowel disease, both vitamin F74 and folic acid were requested since they are important cofactors in the production of normal red blood cells.  Although low normal, the metabolites methylmalonic acid and serum homocysteine were also requested.  Methylmalonic acid was normal.  Serum homocysteine however was mildly elevated.  Since receiving parenteral iron, she does admit to being more energetic and less fatigued.  She is otherwise asymptomatic. It is with this background she presents now for the results of her preliminary evaluation and recommendations in the setting of iron deficiency anemia, likely secondary to chronic microscopic blood loss through the lower gastrointestinal tract associated with chronic inflammatory bowel disease and monthly menstruation and consequent blood loss as outlined above.    In the interim since her last visit she was treated for a upper respiratory tract infection with antibiotics by Dr. Drema Dallas. She has recovered completely.  She denies any recent appetite or weight deficit. She has no visual changes or hearing deficit.  She has no dizziness, lightheadedness, syncope, or near syncopal episodes.  Her overall energy level is gradually improving since infusional iron.  No fever, sore throat, orthopnea, shaking chills, or sweats are evident.  She has no heartburn or indigestion. There is no nausea, vomiting, diarrhea, or constipation. She currently moves her bowels once daily. There is no constipation.  She denies melena or bright red blood per rectum. There is no urinary frequency, urgency, hematuria, or dysuria.  She has  no  swelling of her ankles. Soma is premenopausal and has relatively regular menstruation every 29-30 days, not uncommonly with heavy flow. She reports no swelling of her ankles. There are no focal areas of bone, joint, muscle pain. There is no numbness or tingling in the fingers or toes.  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 She has been receiving golimumab for the past 4 months with markedly improved control of her ulcerative proctitis.   Recommendation/Plan: The results of today's laboratory studies were reviewed and discussed in detail.  Those results are outlined above.  There was a modest increase in hemoglobin with symptomatic but mild improvement in her energy level.  Both white blood cell count and platelets are normal.  Although her iron studies suggest an increase in bone marrow iron storage, with continued monthly menstruation, I would expect her need for parenteral iron to persist.  After discussing this with her, she agrees and is now scheduled for her second dose of parenteral iron on November 13 at the [facility].  Because her serum homocysteine level was high, it was recommended that she begin folic acid: 1 mg once daily with food as an adjunct in anticipation of brisk erythropoiesis.  Because of her ulcerative colitis and concern for vitamin absorption and utilization, a vitamin B 50 complex was recommended with instructions to take 1 capsule once daily with food.  Given the duration of her ulcerative colitis (>10 years), she should continue to follow the direction of Dr. Carol Ada, gastroenterology, with regard to ongoing  surveillance.  Barring any unforeseen complications, her next scheduled doctor visit with laboratory studies is on August 11, 2018 following her second dose of parenteral iron.  She was advised to call us in the interim should any new or untoward problems arise.  The total time spent discussing the results of her laboratory studies both initially and today, the rationale for a second dose of parenteral iron, folic acid, and vitamin B 50 complex with recommendations was 25 minutes. At least 50% of that time was spent in discussion, counseling, and answering questions. There was ample time allotted to answer all questions.  This note was dictated using voice activated technology/software.  Unfortunately, typographical errors are not uncommon, and transcription is subject to mistakes and regrettably misinterpretation.  If necessary, clarification of the above information can be discussed with me at any time.  FOLLOW UP: AS DIRECTED   cc:        Leighton Ruff, MD              Carol Ada, MD              Baltazar Apo, MD   Henreitta Leber, MD  Hematology/Oncology [place] [address] [place], [place] [address] Office: [phone] [facility]: [phone]

## 2018-06-30 NOTE — Telephone Encounter (Signed)
Appts scheduled avs/calendar printed per 11/1 los

## 2018-07-01 DIAGNOSIS — E538 Deficiency of other specified B group vitamins: Secondary | ICD-10-CM | POA: Insufficient documentation

## 2018-07-12 ENCOUNTER — Inpatient Hospital Stay: Payer: BC Managed Care – PPO

## 2018-07-12 VITALS — BP 108/81 | HR 70 | Temp 98.1°F | Resp 18

## 2018-07-12 DIAGNOSIS — D500 Iron deficiency anemia secondary to blood loss (chronic): Secondary | ICD-10-CM

## 2018-07-12 DIAGNOSIS — D509 Iron deficiency anemia, unspecified: Secondary | ICD-10-CM | POA: Diagnosis not present

## 2018-07-12 DIAGNOSIS — K51211 Ulcerative (chronic) proctitis with rectal bleeding: Secondary | ICD-10-CM

## 2018-07-12 MED ORDER — SODIUM CHLORIDE 0.9 % IV SOLN
510.0000 mg | Freq: Once | INTRAVENOUS | Status: AC
  Administered 2018-07-12: 510 mg via INTRAVENOUS
  Filled 2018-07-12: qty 17

## 2018-07-12 MED ORDER — SODIUM CHLORIDE 0.9 % IV SOLN
Freq: Once | INTRAVENOUS | Status: AC
  Administered 2018-07-12: 09:00:00 via INTRAVENOUS
  Filled 2018-07-12: qty 250

## 2018-07-12 NOTE — Patient Instructions (Signed)

## 2018-07-25 ENCOUNTER — Telehealth: Payer: Self-pay | Admitting: Hematology and Oncology

## 2018-07-25 NOTE — Telephone Encounter (Signed)
Patient called to reschedule

## 2018-08-10 ENCOUNTER — Other Ambulatory Visit: Payer: BC Managed Care – PPO

## 2018-08-10 ENCOUNTER — Ambulatory Visit: Payer: BC Managed Care – PPO | Admitting: Hematology and Oncology

## 2018-08-14 ENCOUNTER — Telehealth: Payer: Self-pay | Admitting: Internal Medicine

## 2018-08-14 NOTE — Telephone Encounter (Signed)
Former [facility] patient. Moved 12/19 f/u to [facility]. Left message for patient. Schedule mailed.

## 2018-08-17 ENCOUNTER — Ambulatory Visit: Payer: BC Managed Care – PPO | Admitting: Hematology and Oncology

## 2018-08-17 ENCOUNTER — Other Ambulatory Visit: Payer: BC Managed Care – PPO

## 2018-08-25 ENCOUNTER — Other Ambulatory Visit: Payer: Self-pay | Admitting: Family Medicine

## 2018-08-25 DIAGNOSIS — Z1231 Encounter for screening mammogram for malignant neoplasm of breast: Secondary | ICD-10-CM

## 2018-08-29 ENCOUNTER — Ambulatory Visit: Payer: BC Managed Care – PPO | Admitting: Hematology

## 2018-08-29 ENCOUNTER — Other Ambulatory Visit: Payer: BC Managed Care – PPO

## 2018-08-31 ENCOUNTER — Ambulatory Visit
Admission: RE | Admit: 2018-08-31 | Discharge: 2018-08-31 | Disposition: A | Discharge status: Home / Self Care | Payer: BC Managed Care – PPO | Source: Ambulatory Visit | Attending: Family Medicine | Admitting: Family Medicine

## 2018-08-31 ENCOUNTER — Telehealth: Payer: Self-pay | Admitting: Internal Medicine

## 2018-08-31 DIAGNOSIS — Z1231 Encounter for screening mammogram for malignant neoplasm of breast: Secondary | ICD-10-CM

## 2018-08-31 NOTE — Telephone Encounter (Signed)
Tried to reach regarding voicemail. I did leave a message

## 2018-09-05 ENCOUNTER — Telehealth: Payer: Self-pay | Admitting: Internal Medicine

## 2018-09-05 NOTE — Telephone Encounter (Signed)
R/s appt per 1/7 sch message - pt is aware of appt date and time

## 2018-09-07 ENCOUNTER — Other Ambulatory Visit: Payer: BC Managed Care – PPO

## 2018-09-07 ENCOUNTER — Ambulatory Visit: Payer: BC Managed Care – PPO | Admitting: Internal Medicine

## 2018-09-14 ENCOUNTER — Inpatient Hospital Stay (HOSPITAL_BASED_OUTPATIENT_CLINIC_OR_DEPARTMENT_OTHER): Payer: BC Managed Care – PPO | Admitting: Internal Medicine

## 2018-09-14 ENCOUNTER — Inpatient Hospital Stay: Payer: BC Managed Care – PPO | Attending: Hematology and Oncology

## 2018-09-14 ENCOUNTER — Telehealth: Payer: Self-pay | Admitting: Internal Medicine

## 2018-09-14 VITALS — BP 127/89 | HR 74 | Temp 98.3°F | Resp 18 | Ht 62.0 in | Wt 143.5 lb

## 2018-09-14 DIAGNOSIS — D500 Iron deficiency anemia secondary to blood loss (chronic): Secondary | ICD-10-CM

## 2018-09-14 DIAGNOSIS — Z803 Family history of malignant neoplasm of breast: Secondary | ICD-10-CM

## 2018-09-14 DIAGNOSIS — I10 Essential (primary) hypertension: Secondary | ICD-10-CM | POA: Diagnosis not present

## 2018-09-14 DIAGNOSIS — K5190 Ulcerative colitis, unspecified, without complications: Secondary | ICD-10-CM

## 2018-09-14 DIAGNOSIS — K219 Gastro-esophageal reflux disease without esophagitis: Secondary | ICD-10-CM

## 2018-09-14 DIAGNOSIS — Z79899 Other long term (current) drug therapy: Secondary | ICD-10-CM | POA: Diagnosis not present

## 2018-09-14 LAB — CBC WITH DIFFERENTIAL (CANCER CENTER ONLY)
Abs Immature Granulocytes: 0.01 10*3/uL (ref 0.00–0.07)
Basophils Absolute: 0 10*3/uL (ref 0.0–0.1)
Basophils Relative: 0 %
EOS ABS: 0.5 10*3/uL (ref 0.0–0.5)
Eosinophils Relative: 7 %
HCT: 37.4 % (ref 36.0–46.0)
Hemoglobin: 12.5 g/dL (ref 12.0–15.0)
Immature Granulocytes: 0 %
Lymphocytes Relative: 38 %
Lymphs Abs: 2.6 10*3/uL (ref 0.7–4.0)
MCH: 29.4 pg (ref 26.0–34.0)
MCHC: 33.4 g/dL (ref 30.0–36.0)
MCV: 88 fL (ref 80.0–100.0)
Monocytes Absolute: 0.5 10*3/uL (ref 0.1–1.0)
Monocytes Relative: 8 %
Neutro Abs: 3.2 10*3/uL (ref 1.7–7.7)
Neutrophils Relative %: 47 %
Platelet Count: 389 10*3/uL (ref 150–400)
RBC: 4.25 MIL/uL (ref 3.87–5.11)
RDW: 13.2 % (ref 11.5–15.5)
WBC Count: 6.8 10*3/uL (ref 4.0–10.5)
nRBC: 0 % (ref 0.0–0.2)

## 2018-09-14 NOTE — Telephone Encounter (Signed)
Patient declined calendar and avs.

## 2018-09-14 NOTE — Progress Notes (Signed)
Diagnosis Iron deficiency anemia due to chronic blood loss - Plan: CBC with Differential/Platelet, Comprehensive metabolic panel, Lactate dehydrogenase, Protein electrophoresis, serum, Ferritin, Methylmalonic acid, serum, Vitamin B12, Folate  Staging Cancer Staging No matching staging information was found for the patient.  Assessment and Plan:  1.  Iron deficiency anemia.  Pt was followed by Dr. Audelia Hives.   Ferritin was decreased at 4 on labs done 04/2018.  She was treated with IV iron in October and November 2019.  Likely etiology felt due to ulcerative colitis.  She has been on immunosuppressive therapy in the past with azathioprine, infliximab, and vedolizumab. She has been receiving golimumab for the past 4 months with markedly improved disease control.  Her inflammatory bowel disease has, in the past, been steroid-refractory and difficult to control.  Labs done today 09/14/2018 reviewed and showed WBC 6.8 Hb 12.5 plts 389,000.  Ferritin in 06/2018 was improved at 62.  Pt will RTC in 11/2018 with repeat labs.  She should notify the office if any problems prior to that time.   2.  UC.  She has been on immunosuppressive therapy in the past with azathioprine, infliximab, and vedolizumab. She has been receiving golimumab for the past 4 months with markedly improved disease control.  Her inflammatory bowel disease has, in the past, been steroid-refractory and difficult to control.  Pt should follow-up with GI as directed.   3.  Health maintenance.  Continue GI follow-up and mammogram screenings as recommended.    Interval History:  [name] for anemia.  Pt has history of ulcerative colitis, Gabrielle Wilkins has been on immunosuppressive therapy in the past with azathioprine, infliximab, and vedolizumab. She has been receiving golimumab for the past 4 months with markedly improved disease control.  Her inflammatory bowel disease has, in the past, been steroid-refractory and difficult to  control.  Current Status:  Pt is seen today for follow-up after IV iron.  She reports symptoms improved after IV iron.    Problem List Patient Active Problem List   Diagnosis Date Noted  . Folic acid deficiency [Y30.1] 07/01/2018  . Iron deficiency anemia due to chronic blood loss [D50.0] 05/29/2018  . Pulmonary nodules [R91.8] 11/07/2016  . Immunocompromised state ([place]) [D89.9] 11/07/2016  . Anemia [D64.9] 11/07/2016  . Ulcerative (chronic) proctitis with rectal bleeding ([place]) [K51.211] 09/05/2015  . Chronic ulcerative proctitis ([place]) [K51.20] 03/14/2014    Past Medical History Past Medical History:  Diagnosis Date  . Anemia   . Depression    Not for a very LONG time  . Dyspnea   . GERD (gastroesophageal reflux disease)    OCCASIONALLY  . Hx of inverse psoriasis   . Hypertension    Chronic Hypertension  . Migraine, ophthalmoplegic   . Pregnancy induced hypertension   . Ulcerative colitis   . Ulcerative colitis [facility])     Past Surgical History Past Surgical History:  Procedure Laterality Date  . CESAREAN SECTION  04/05/2011   Procedure: CESAREAN SECTION;  Surgeon: Logan Bores;  Location: [facility];  Service: Gynecology;  Laterality: N/A;  baby boy 23  APGAR 7/9  . CHOLECYSTECTOMY  2008  . COLONOSCOPY N/A   . DILATION AND CURETTAGE OF UTERUS    . VIDEO BRONCHOSCOPY Bilateral 11/08/2016   Procedure: VIDEO BRONCHOSCOPY WITHOUT FLUORO;  Surgeon: Rush Farmer, MD;  Location: [place];  Service: Cardiopulmonary;  Laterality: Bilateral;  . VIDEO BRONCHOSCOPY WITH ENDOBRONCHIAL NAVIGATION N/A 12/15/2016   Procedure: VIDEO BRONCHOSCOPY WITH ENDOBRONCHIAL  NAVIGATION;  Surgeon: Collene Gobble, MD;  Location: [facility];  Service: Thoracic;  Laterality: N/A;    Family History Family History  Problem Relation Age of Onset  . Pancreatic cancer Mother   . Hypertension Mother   . Cystic kidney disease Mother   . Heart attack Father   . Alcoholism Father   . Cirrhosis Father    . Breast cancer Father   . Hypertension Brother   . Rheum arthritis Brother   . Arthritis/Rheumatoid Brother   . Hypertension Brother   . HIV Brother   . Bipolar disorder Brother   . Hypertension Brother   . Heart attack Other 11       Niece  . Breast cancer Maternal Grandmother   . Breast cancer Paternal Grandmother      Social History  reports that she is a non-smoker but has been exposed to tobacco smoke. She has never used smokeless tobacco. She reports current alcohol use of about 7.0 standard drinks of alcohol per week. She reports that she does not use drugs.  Medications  Current Outpatient Medications:  .  folic acid (FOLVITE) 1 MG tablet, Take 1 mg by mouth., Disp: , Rfl:  .  Golimumab (SIMPONI) 100 MG/ML SOAJ, Inject into the skin., Disp: , Rfl:  .  lisinopril-hydrochlorothiazide (PRINZIDE,ZESTORETIC) 10-12.5 MG per tablet, Take 1 tablet by mouth daily at 6 (six) AM. , Disp: , Rfl:  No current facility-administered medications for this visit.   Facility-Administered Medications Ordered in Other Visits:  .  heparin lock flush 100 unit/mL, 500 Units, Intracatheter, Once PRN, Henreitta Leber, MD .  sodium chloride flush (NS) 0.9 % injection 10 mL, 10 mL, Intracatheter, Once PRN, Henreitta Leber, MD  Allergies Ceclor [cefaclor]  Review of Systems Review of Systems - Oncology ROS negative   Physical Exam  Vitals Wt Readings from Last 3 Encounters:  09/14/18 143 lb 8 oz (65.1 kg)  06/30/18 136 lb 8 oz (61.9 kg)  05/26/18 131 lb 8 oz (59.6 kg)   Temp Readings from Last 3 Encounters:  09/14/18 98.3 F (36.8 C) (Oral)  07/12/18 98.1 F (36.7 C) (Oral)  06/30/18 98.6 F (37 C) (Oral)   BP Readings from Last 3 Encounters:  09/14/18 127/89  07/12/18 108/81  06/30/18 (!) 128/94   Pulse Readings from Last 3 Encounters:  09/14/18 74  07/12/18 70  06/30/18 71   Constitutional: Well-developed, well-nourished, and in no distress.   HENT: Head:  Normocephalic and atraumatic.  Mouth/Throat: No oropharyngeal exudate. Mucosa moist. Eyes: Pupils are equal, round, and reactive to light. Conjunctivae are normal. No scleral icterus.  Neck: Normal range of motion. Neck supple. No JVD present.  Cardiovascular: Normal rate, regular rhythm and normal heart sounds.  Exam reveals no gallop and no friction rub.   No murmur heard. Pulmonary/Chest: Effort normal and breath sounds normal. No respiratory distress. No wheezes.No rales.  Abdominal: Soft. Bowel sounds are normal. No distension. There is no tenderness. There is no guarding.  Musculoskeletal: No edema or tenderness.  Lymphadenopathy: No cervical, axillary or supraclavicular adenopathy.  Neurological: Alert and oriented to person, place, and time. No cranial nerve deficit.  Skin: Skin is warm and dry. No rash noted. No erythema. No pallor.  Psychiatric: Affect and judgment normal.   Labs Appointment on 09/14/2018  Component Date Value Ref Range Status  . WBC Count 09/14/2018 6.8  4.0 - 10.5 K/uL Final  . RBC 09/14/2018 4.25  3.87 - 5.11 MIL/uL Final  .  Hemoglobin 09/14/2018 12.5  12.0 - 15.0 g/dL Final  . HCT 09/14/2018 37.4  36.0 - 46.0 % Final  . MCV 09/14/2018 88.0  80.0 - 100.0 fL Final  . MCH 09/14/2018 29.4  26.0 - 34.0 pg Final  . MCHC 09/14/2018 33.4  30.0 - 36.0 g/dL Final  . RDW 09/14/2018 13.2  11.5 - 15.5 % Final  . Platelet Count 09/14/2018 389  150 - 400 K/uL Final  . nRBC 09/14/2018 0.0  0.0 - 0.2 % Final  . Neutrophils Relative % 09/14/2018 47  % Final  . Neutro Abs 09/14/2018 3.2  1.7 - 7.7 K/uL Final  . Lymphocytes Relative 09/14/2018 38  % Final  . Lymphs Abs 09/14/2018 2.6  0.7 - 4.0 K/uL Final  . Monocytes Relative 09/14/2018 8  % Final  . Monocytes Absolute 09/14/2018 0.5  0.1 - 1.0 K/uL Final  . Eosinophils Relative 09/14/2018 7  % Final  . Eosinophils Absolute 09/14/2018 0.5  0.0 - 0.5 K/uL Final  . Basophils Relative 09/14/2018 0  % Final  . Basophils  Absolute 09/14/2018 0.0  0.0 - 0.1 K/uL Final  . Immature Granulocytes 09/14/2018 0  % Final  . Abs Immature Granulocytes 09/14/2018 0.01  0.00 - 0.07 K/uL Final   Performed at [facility], [place] [address], [place], [place] [address]     Pathology Orders Placed This Encounter  Procedures  . CBC with Differential/Platelet    Standing Status:   Future    Standing Expiration Date:   09/15/2019  . Comprehensive metabolic panel    Standing Status:   Future    Standing Expiration Date:   09/15/2019  . Lactate dehydrogenase    Standing Status:   Future    Standing Expiration Date:   09/15/2019  . Protein electrophoresis, serum    Standing Status:   Future    Standing Expiration Date:   09/15/2019  . Ferritin    Standing Status:   Future    Standing Expiration Date:   09/15/2019  . Methylmalonic acid, serum    Standing Status:   Future    Standing Expiration Date:   09/15/2019  . Vitamin B12    Standing Status:   Future    Standing Expiration Date:   09/15/2019  . Folate    Standing Status:   Future    Standing Expiration Date:   09/15/2019       Gabrielle Shutter MD

## 2018-10-08 ENCOUNTER — Ambulatory Visit (INDEPENDENT_AMBULATORY_CARE_PROVIDER_SITE_OTHER): Payer: BC Managed Care – PPO

## 2018-10-08 ENCOUNTER — Other Ambulatory Visit: Payer: Self-pay

## 2018-10-08 ENCOUNTER — Ambulatory Visit (HOSPITAL_COMMUNITY)
Admission: EM | Admit: 2018-10-08 | Discharge: 2018-10-08 | Disposition: A | Discharge status: Home / Self Care | Payer: BC Managed Care – PPO | Attending: Emergency Medicine | Admitting: Emergency Medicine

## 2018-10-08 ENCOUNTER — Encounter (HOSPITAL_COMMUNITY): Payer: Self-pay | Admitting: Emergency Medicine

## 2018-10-08 DIAGNOSIS — J069 Acute upper respiratory infection, unspecified: Secondary | ICD-10-CM | POA: Insufficient documentation

## 2018-10-08 DIAGNOSIS — B9789 Other viral agents as the cause of diseases classified elsewhere: Secondary | ICD-10-CM | POA: Diagnosis present

## 2018-10-08 DIAGNOSIS — R05 Cough: Secondary | ICD-10-CM

## 2018-10-08 LAB — POCT RAPID STREP A: Streptococcus, Group A Screen (Direct): NEGATIVE

## 2018-10-08 NOTE — ED Triage Notes (Signed)
The patient presented to the [facility] with a complaint of a sore throat and fever of 101.33F.

## 2018-10-08 NOTE — Discharge Instructions (Addendum)
Strep test was negative for any infection Your x-ray did not show any bronchitis or pneumonia You can do over-the-counter symptomatic treatment for your symptoms Follow-up with your doctor for continued or worsening symptoms

## 2018-10-08 NOTE — ED Provider Notes (Signed)
[place]    CSN: [id number] Arrival date & time: 10/08/18  1001     History   Chief Complaint Chief Complaint  Patient presents with  . Sore Throat    HPI Gabrielle Wilkins is a 43 y.o. female.   Patient is a 43 year old female past medical history of anemia, depression, GERD, hypertension, ulcerative colitis.  She presents today complaining of sore throat, cough, congestion that started yesterday.  She also has had fever reported at 101.7 at home.  She took ibuprofen for the fever this morning. She reports similar sore throat approximate 1 week ago and was seen at another urgent care and had negative strep results.  They prescribed her lidocaine which did help her symptoms.  Her symptoms mostly resolved but then returned this morning.  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 No recent traveling.  She has had some chills and myalgias.  Denies any chest pain, shortness of breath.  ROS per HPI      Past Medical History:  Diagnosis Date  . Anemia   . Depression    Not for a very LONG time  . Dyspnea   . GERD (gastroesophageal reflux disease)    OCCASIONALLY  . Hx of inverse psoriasis   . Hypertension    Chronic Hypertension  . Migraine, ophthalmoplegic   . Pregnancy induced hypertension   . Ulcerative colitis   . Ulcerative colitis [facility])     Patient Active Problem List   Diagnosis Date Noted  . Folic acid deficiency 35/59/7416  . Iron deficiency anemia due to chronic blood loss 05/29/2018  . Pulmonary nodules 11/07/2016  . Immunocompromised state ([place]) 11/07/2016  . Anemia 11/07/2016  . Ulcerative (chronic) proctitis with rectal bleeding ([place]) 09/05/2015  . Chronic ulcerative proctitis ([place]) 03/14/2014    Past Surgical History:  Procedure Laterality Date  . CESAREAN SECTION  04/05/2011   Procedure: CESAREAN SECTION;  Surgeon: Logan Bores;  Location: [facility];  Service: Gynecology;  Laterality: N/A;  baby boy 19  APGAR 7/9  .  CHOLECYSTECTOMY  2008  . COLONOSCOPY N/A   . DILATION AND CURETTAGE OF UTERUS    . VIDEO BRONCHOSCOPY Bilateral 11/08/2016   Procedure: VIDEO BRONCHOSCOPY WITHOUT FLUORO;  Surgeon: Rush Farmer, MD;  Location: [place];  Service: Cardiopulmonary;  Laterality: Bilateral;  . VIDEO BRONCHOSCOPY WITH ENDOBRONCHIAL NAVIGATION N/A 12/15/2016   Procedure: VIDEO BRONCHOSCOPY WITH ENDOBRONCHIAL NAVIGATION;  Surgeon: Collene Gobble, MD;  [facility];  Service: Thoracic;  Laterality: N/A;    OB History    Gravida  5   Para  3   Term  2   Preterm  1   AB  2   Living  3     SAB  2   TAB  0   Ectopic  0   Multiple  0   Live Births  3            Home Medications    Prior to Admission medications   Medication Sig Start Date End Date Taking? Authorizing Provider  folic acid (FOLVITE) 1 MG tablet Take 1 mg by mouth.    [provider]  Golimumab (SIMPONI) 100 MG/ML SOAJ Inject into the skin.    [provider]  lisinopril-hydrochlorothiazide (PRINZIDE,ZESTORETIC) 10-12.5 MG per tablet Take 1 tablet by mouth daily at 6 (six) AM.     [provider]    Family History Family History  Problem Relation Age of  Onset  . Pancreatic cancer Mother   . Hypertension Mother   . Cystic kidney disease Mother   . Heart attack Father   . Alcoholism Father   . Cirrhosis Father   . Breast cancer Father   . Hypertension Brother   . Rheum arthritis Brother   . Arthritis/Rheumatoid Brother   . Hypertension Brother   . HIV Brother   . Bipolar disorder Brother   . Hypertension Brother   . Heart attack Other 79       Niece  . Breast cancer Maternal Grandmother   . Breast cancer Paternal Grandmother     Social History Social History   Tobacco Use  . Smoking status: Passive Smoke Exposure - Never Smoker  . Smokeless tobacco: Never Used  . Tobacco comment: Father as a child  Substance Use Topics  . Alcohol use: Yes    Alcohol/week: 7.0 standard  drinks    Types: 3 Standard drinks or equivalent, 4 Glasses of wine per week  . Drug use: No     Allergies   Ceclor [cefaclor]   Review of Systems Review of Systems   Physical Exam Triage Vital Signs ED Triage Vitals  Enc Vitals Group     BP 10/08/18 1024 136/89     Pulse Rate 10/08/18 1024 92     Resp 10/08/18 1024 18     Temp 10/08/18 1024 99 F (37.2 C)     Temp Source 10/08/18 1024 Oral     SpO2 10/08/18 1024 98 %     Weight --      Height --      Head Circumference --      Peak Flow --      Pain Score 10/08/18 1025 2     Pain Loc --      Pain Edu? --      Excl. in [place]? --    No data found.  Updated Vital Signs BP 136/89 (BP Location: Left Arm)   Pulse 92   Temp 99 F (37.2 C) (Oral)   Resp 18   LMP 09/24/2018   SpO2 98%   Visual Acuity Right Eye Distance:   Left Eye Distance:   Bilateral Distance:    Right Eye Near:   Left Eye Near:    Bilateral Near:     Physical Exam Vitals signs and nursing note reviewed.  Constitutional:      General: She is not in acute distress.    Appearance: She is well-developed. She is not ill-appearing, toxic-appearing or diaphoretic.  HENT:     Head: Normocephalic and atraumatic.     Right Ear: Tympanic membrane and ear canal normal.     Left Ear: Tympanic membrane and ear canal normal.     Mouth/Throat:     Pharynx: Posterior oropharyngeal erythema present.     Tonsils: No tonsillar exudate. Swelling: 0 on the right. 0 on the left.  Neck:     Musculoskeletal: Normal range of motion.  Cardiovascular:     Rate and Rhythm: Normal rate and regular rhythm.     Heart sounds: Normal heart sounds.  Pulmonary:     Effort: Pulmonary effort is normal.     Comments: Coarse lung sounds throughout Lymphadenopathy:     Cervical: No cervical adenopathy.  Skin:    General: Skin is warm and dry.  Neurological:     Mental Status: She is alert.  Psychiatric:        Mood and Affect: Mood  normal.      UC Treatments /  Results  Labs (all labs ordered are listed, but only abnormal results are displayed) Labs Reviewed  CULTURE, GROUP A STREP [facility])  POCT RAPID STREP A    EKG None  Radiology Dg Chest 2 View  Result Date: 10/08/2018 CLINICAL DATA:  43 year old female with history of cough and sore throat. Fever. EXAM: CHEST - 2 VIEW COMPARISON:  Chest x-ray 05/30/2018. FINDINGS: Lung volumes are normal. No consolidative airspace disease. No pleural effusions. No pneumothorax. No pulmonary nodule or mass noted. Pulmonary vasculature and the cardiomediastinal silhouette are within normal limits. IMPRESSION: No radiographic evidence of acute cardiopulmonary disease. Electronically Signed   By: Vinnie Langton M.D.   On: 10/08/2018 11:12    Procedures Procedures (including critical care time)  Medications Ordered in UC Medications - No data to display  Initial Impression / Assessment and Plan / UC Course  I have reviewed the triage vital signs and the nursing notes.  Pertinent labs & imaging results that were available during my care of the patient were reviewed by me and considered in my medical decision making (see chart for details).     Rapid strep test negative.  Will send for culture. Chest x-ray did not reveal any pneumonia or bronchitis. This is most likely a viral upper respiratory infection Over-the-counter symptomatic treatment as needed for symptoms Follow up as needed for continued or worsening symptoms  Final Clinical Impressions(s) / UC Diagnoses   Final diagnoses:  Viral URI with cough     Discharge Instructions     Strep test was negative for any infection Your x-ray did not show any bronchitis or pneumonia You can do over-the-counter symptomatic treatment for your symptoms Follow-up with your doctor for continued or worsening symptoms    ED Prescriptions    None     Controlled Substance Prescriptions [place] Controlled Substance Registry consulted? Not Applicable     Orvan July, NP 10/08/18 1134

## 2018-10-10 LAB — CULTURE, GROUP A STREP (THRC)

## 2018-12-14 ENCOUNTER — Inpatient Hospital Stay: Payer: BC Managed Care – PPO | Admitting: Internal Medicine

## 2018-12-14 ENCOUNTER — Inpatient Hospital Stay: Payer: BC Managed Care – PPO

## 2019-01-05 IMAGING — CT CT ANGIO CHEST
2 of 6 series · 17 of 36 positions shown · IV contrast (Omni 300)
Comparison: None.

CLINICAL DATA: Chest and right upper quadrant abdominal pain for
several days with a pleuritic component.



[Series 5: pe thins · axial · 0.62mm/px · z∈[+1141,+1357]mm · 16 of 244 slices shown]
[im 14/244  lung]
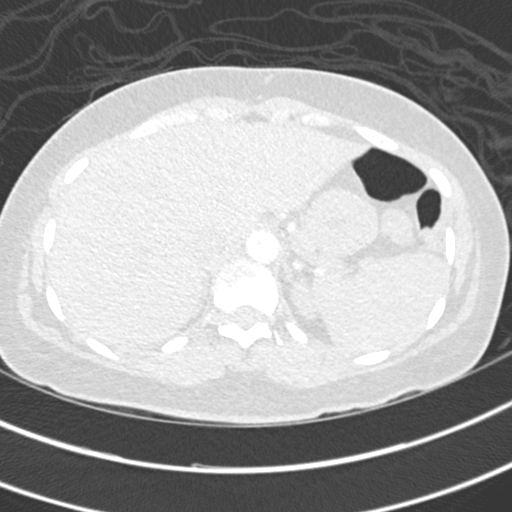
[im 28/244  mediastinal]
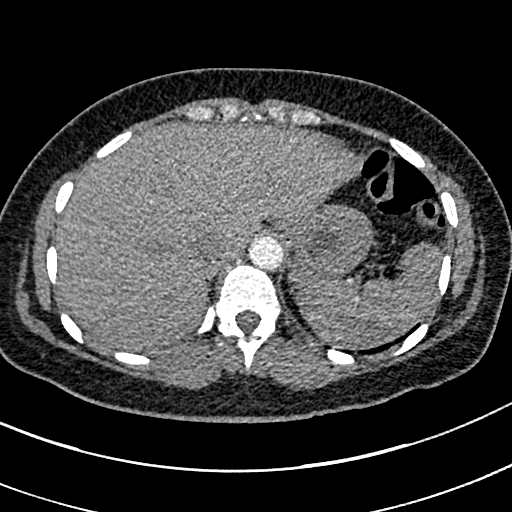
[im 41/244  lung]
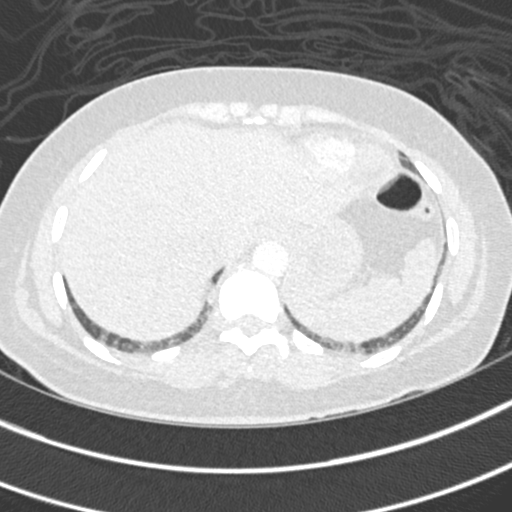
[im 55/244  mediastinal]
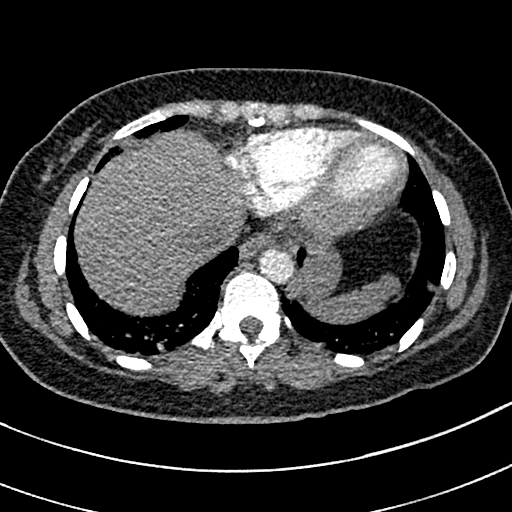
[im 68/244  lung]
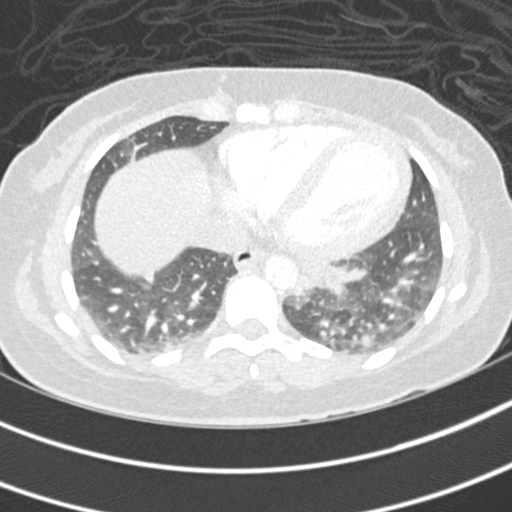
[im 82/244  mediastinal]
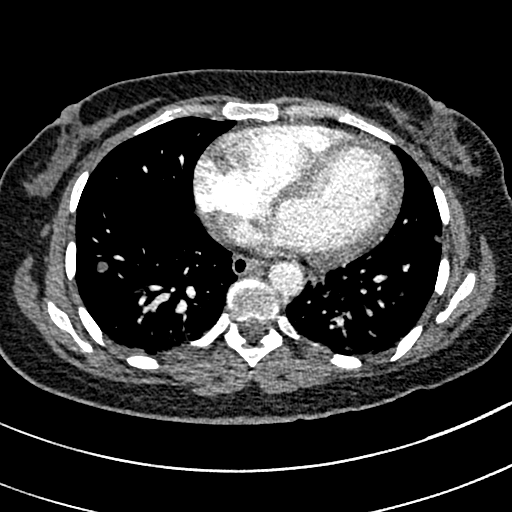
[im 95/244  lung]
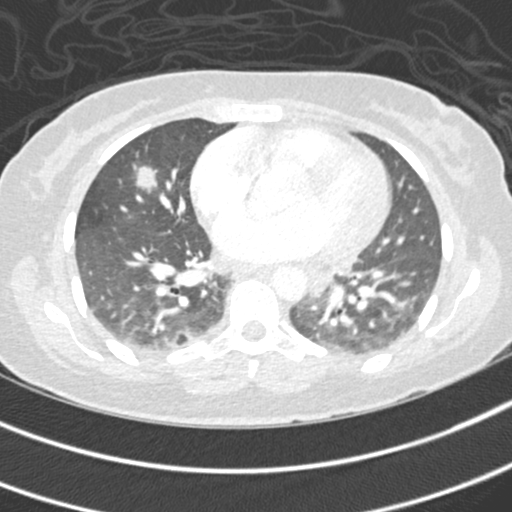
[im 109/244  mediastinal]
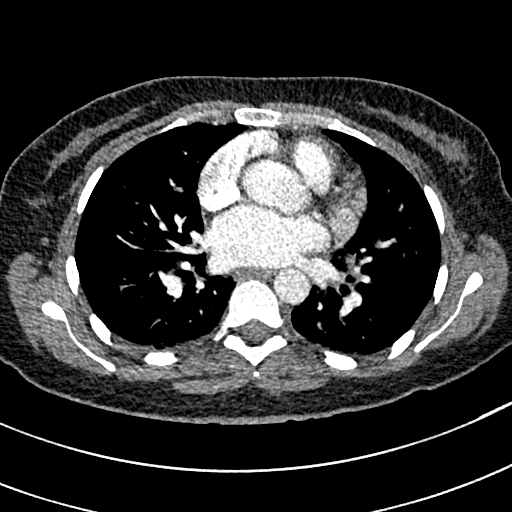
[im 136/244  lung]
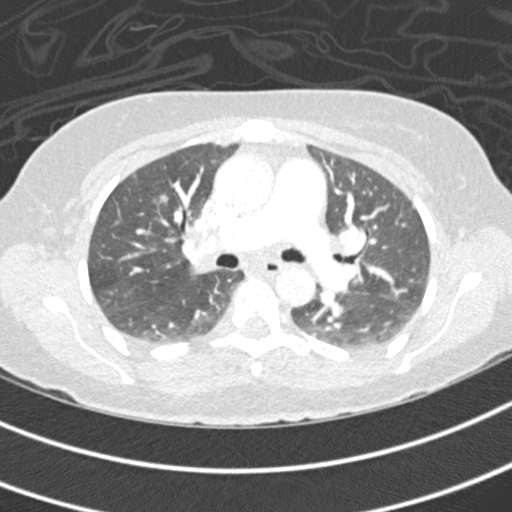
[im 149/244  mediastinal]
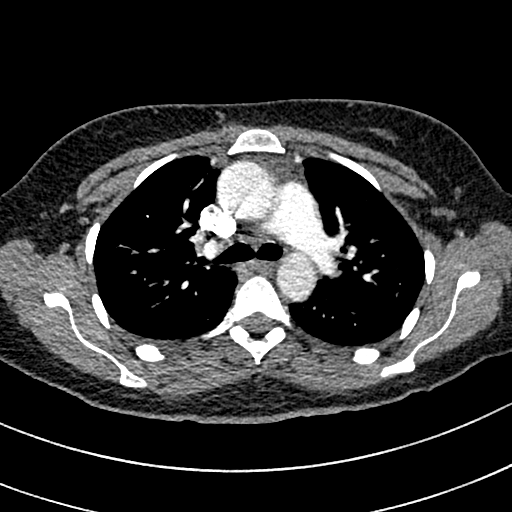
[im 163/244  lung]
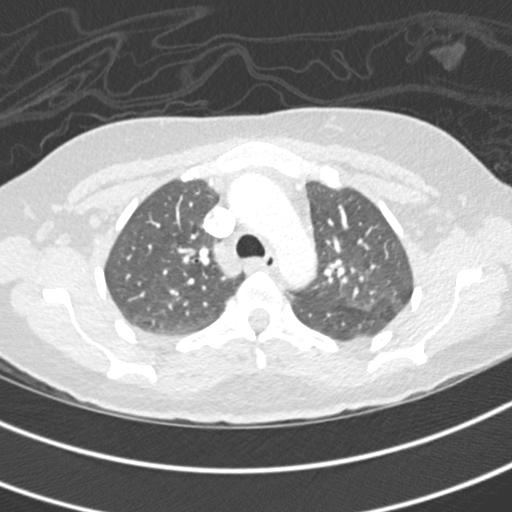
[im 176/244  mediastinal]
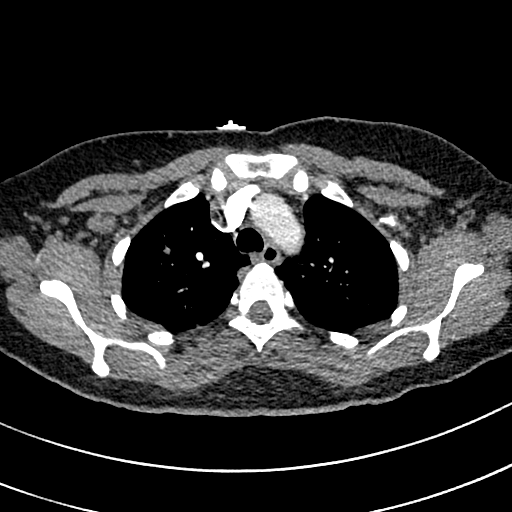
[im 190/244  lung]
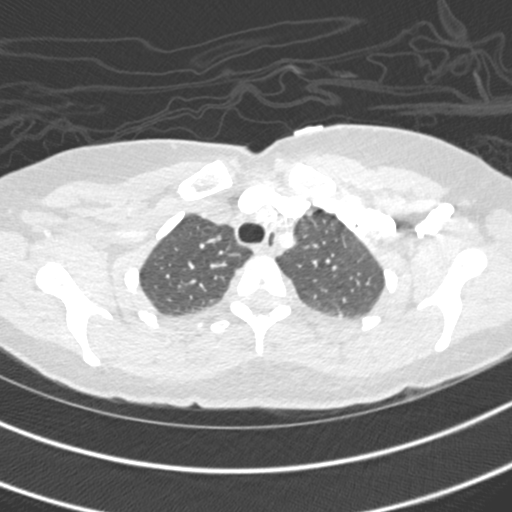
[im 203/244  mediastinal]
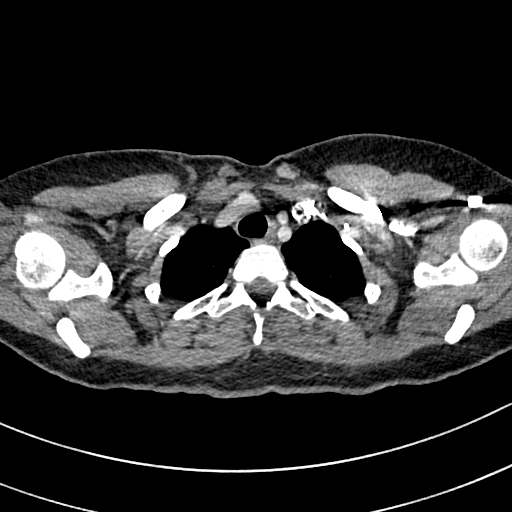
[im 217/244  lung]
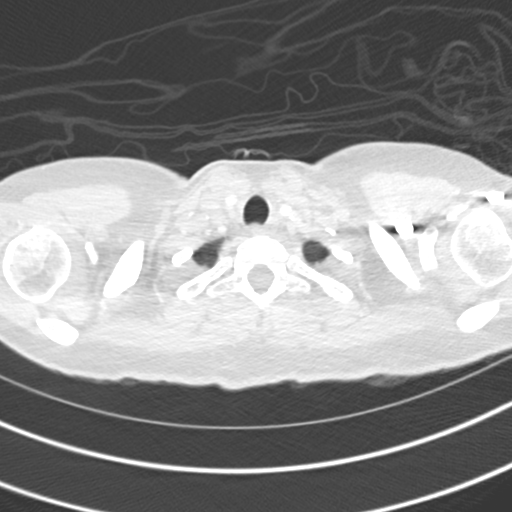
[im 230/244  mediastinal]
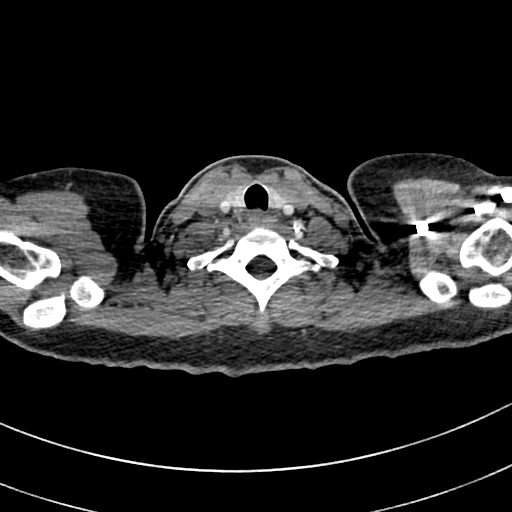

[Series 6: pe 2mm cor · coronal · 0.50mm/px · 1 of 99 slices shown]
[im 50/99  mediastinal]
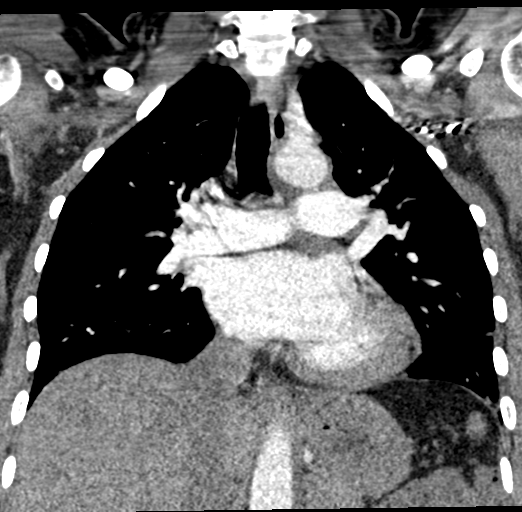

[17 of 36 positions shown; findings below may reference images not displayed]

FINDINGS: CTA CHEST FINDINGS

Cardiovascular: The study is high quality for the evaluation of
pulmonary embolism. There are no filling defects in the central,
lobar, segmental or subsegmental pulmonary artery branches to
suggest acute pulmonary embolism. Great vessels are normal in course
and caliber. Normal heart size. No significant pericardial
fluid/thickening.

Mediastinum/Nodes: No discrete thyroid nodules. Unremarkable
esophagus. No pathologically enlarged axillary, mediastinal or hilar
lymph nodes.

Lungs/Pleura: No pneumothorax. No pleural effusion. There are
numerous (greater than 15) solid round pulmonary nodules scattered
throughout both lungs, which demonstrate an irregular contour with
small ground-glass halos, largest 1.6 x 1.3 cm in the right middle
lobe (series 4/ image 76) and 1.4 x 1.3 cm in the right lower lobe
(series 4/ image 80).

Musculoskeletal:  No aggressive appearing focal osseous lesions.

Review of the MIP images confirms the above findings.

CT ABDOMEN and PELVIS FINDINGS

Hepatobiliary: Normal liver with no liver mass. Cholecystectomy. No
biliary ductal dilatation.

Pancreas: Normal, with no mass or duct dilation.

Spleen: Normal size. No mass.

Adrenals/Urinary Tract: Normal adrenals. Normal kidneys with no
hydronephrosis and no renal mass. Normal bladder.

Stomach/Bowel: Grossly normal stomach. Normal caliber small bowel
with no small bowel wall thickening. Normal appendix. Normal large
bowel with no diverticulosis, large bowel wall thickening or
pericolonic fat stranding.

Vascular/Lymphatic: Mildly atherosclerotic nonaneurysmal abdominal
aorta. Patent portal, splenic, hepatic and renal veins. No
pathologically enlarged lymph nodes in the abdomen or pelvis.

Reproductive: Grossly normal retroverted uterus.  No adnexal mass.

Other: No pneumoperitoneum, ascites or focal fluid collection.

Musculoskeletal: No aggressive appearing focal osseous lesions.

Review of the MIP images confirms the above findings.
IMPRESSION: 1. No pulmonary embolism.
2. Numerous irregular round solid pulmonary nodules scattered
throughout both lungs with small ground-glass halos, largest 1.6 cm
in the right middle lobe. An atypical infectious or inflammatory
etiology is suspected. Consider fungal pneumonia, septic emboli or
pulmonary vasculitis. Metastatic disease is less likely, although
not entirely excluded. Short term posttreatment follow-up chest CT
advised.
3. No lymphadenopathy in the chest, abdomen or pelvis.
4. No acute abnormality in the abdomen or pelvis.
5. Aortic atherosclerosis.

## 2019-05-23 ENCOUNTER — Other Ambulatory Visit: Payer: Self-pay

## 2019-05-23 DIAGNOSIS — Z20822 Contact with and (suspected) exposure to covid-19: Secondary | ICD-10-CM

## 2019-05-25 LAB — NOVEL CORONAVIRUS, NAA: SARS-CoV-2, NAA: NOT DETECTED

## 2019-08-10 ENCOUNTER — Other Ambulatory Visit: Payer: Self-pay

## 2019-08-10 DIAGNOSIS — Z20822 Contact with and (suspected) exposure to covid-19: Secondary | ICD-10-CM

## 2019-08-12 LAB — NOVEL CORONAVIRUS, NAA: SARS-CoV-2, NAA: NOT DETECTED

## 2019-08-30 ENCOUNTER — Ambulatory Visit: Payer: BC Managed Care – PPO | Attending: Internal Medicine

## 2019-08-30 DIAGNOSIS — Z20822 Contact with and (suspected) exposure to covid-19: Secondary | ICD-10-CM

## 2019-08-31 HISTORY — PX: FOOT SURGERY: SHX648

## 2019-08-31 LAB — NOVEL CORONAVIRUS, NAA: SARS-CoV-2, NAA: NOT DETECTED

## 2019-10-01 ENCOUNTER — Other Ambulatory Visit: Payer: Self-pay | Admitting: Family Medicine

## 2019-10-01 DIAGNOSIS — Z1231 Encounter for screening mammogram for malignant neoplasm of breast: Secondary | ICD-10-CM

## 2019-10-25 ENCOUNTER — Ambulatory Visit: Payer: BC Managed Care – PPO

## 2019-11-02 ENCOUNTER — Other Ambulatory Visit: Payer: Self-pay

## 2019-11-02 ENCOUNTER — Ambulatory Visit
Admission: RE | Admit: 2019-11-02 | Discharge: 2019-11-02 | Disposition: A | Discharge status: Home / Self Care | Payer: BC Managed Care – PPO | Source: Ambulatory Visit | Attending: Family Medicine | Admitting: Family Medicine

## 2019-11-02 DIAGNOSIS — Z1231 Encounter for screening mammogram for malignant neoplasm of breast: Secondary | ICD-10-CM

## 2020-01-04 ENCOUNTER — Other Ambulatory Visit (HOSPITAL_COMMUNITY)
Admission: RE | Admit: 2020-01-04 | Discharge: 2020-01-04 | Disposition: A | Discharge status: Home / Self Care | Payer: BC Managed Care – PPO | Source: Ambulatory Visit | Attending: Family Medicine | Admitting: Family Medicine

## 2020-01-04 DIAGNOSIS — Z124 Encounter for screening for malignant neoplasm of cervix: Secondary | ICD-10-CM | POA: Insufficient documentation

## 2020-01-08 LAB — CYTOLOGY - PAP
Adequacy: ABSENT
Diagnosis: NEGATIVE

## 2020-10-07 ENCOUNTER — Other Ambulatory Visit: Payer: Self-pay | Admitting: Family Medicine

## 2020-10-07 DIAGNOSIS — Z1231 Encounter for screening mammogram for malignant neoplasm of breast: Secondary | ICD-10-CM

## 2020-11-26 ENCOUNTER — Inpatient Hospital Stay: Admission: RE | Admit: 2020-11-26 | Payer: BC Managed Care – PPO | Source: Ambulatory Visit

## 2020-12-06 ENCOUNTER — Ambulatory Visit
Admission: RE | Admit: 2020-12-06 | Discharge: 2020-12-06 | Disposition: A | Discharge status: Home / Self Care | Payer: BC Managed Care – PPO | Source: Ambulatory Visit | Attending: Family Medicine | Admitting: Family Medicine

## 2020-12-06 ENCOUNTER — Other Ambulatory Visit: Payer: Self-pay

## 2020-12-06 DIAGNOSIS — Z1231 Encounter for screening mammogram for malignant neoplasm of breast: Secondary | ICD-10-CM

## 2020-12-23 ENCOUNTER — Ambulatory Visit: Payer: Self-pay

## 2020-12-23 ENCOUNTER — Ambulatory Visit (INDEPENDENT_AMBULATORY_CARE_PROVIDER_SITE_OTHER): Payer: BC Managed Care – PPO

## 2020-12-23 ENCOUNTER — Ambulatory Visit (INDEPENDENT_AMBULATORY_CARE_PROVIDER_SITE_OTHER): Payer: BC Managed Care – PPO | Admitting: Podiatry

## 2020-12-23 ENCOUNTER — Other Ambulatory Visit: Payer: Self-pay

## 2020-12-23 ENCOUNTER — Encounter: Payer: Self-pay | Admitting: Podiatry

## 2020-12-23 DIAGNOSIS — R2242 Localized swelling, mass and lump, left lower limb: Secondary | ICD-10-CM

## 2020-12-23 DIAGNOSIS — M722 Plantar fascial fibromatosis: Secondary | ICD-10-CM

## 2020-12-23 NOTE — Progress Notes (Signed)
  Subjective:  Patient ID: Gabrielle Wilkins, female    DOB: 1976/06/02,  MRN: [id number]  Chief Complaint  Patient presents with  . Ganglion Cyst      (xray)(np) 1x1 cm subcutaneous mass arch of left foot    45 y.o. female presents with the above complaint. History confirmed with patient.  Not painful on its own but causes pain when having pressure from her shoes.  Been worsening over the last few weeks.  Objective:  Physical Exam: warm, good capillary refill, no trophic changes or ulcerative lesions, normal DP and PT pulses and normal sensory exam. 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 Subcutaneous mass of left foot      Plan:  Patient was evaluated and treated and all questions answered.   discussed with her the etiology and treatment options for subcutaneous masses in the foot including the fact that malignancy is very rare and nearly all lesions are benign.  I suspect this is likely a small ganglion cyst or plantar fibroma attached to the medial band of the plantar fascia.  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  I recommend surgical incision.  Would like to evaluate this with an MRI to characterize the lesion prior to surgery.  We discussed the risk, benefits potential complications from surgical excision.  Also discussed the postoperative course.  Informed consent was signed and reviewed today.   Surgical plan:  Procedure: -Left foot subcutaneous mass excision  [facility] -[facility]  Anesthesia plan: -IV sedation with local anesthesia  Postoperative pain  plan: - Tylenol 1000 mg every 6 hours, ibuprofen 600 mg every 6 hours, gabapentin 300 mg every 8 hours x5 days, oxycodone 5 mg 1-2 tabs every 6 hours only as needed  DVT prophylaxis: -None required  WB Restrictions / DME needs: -WBAT in CAM boot, this was dispensed today   No follow-ups on file.

## 2021-01-06 ENCOUNTER — Telehealth: Payer: Self-pay

## 2021-01-06 NOTE — Addendum Note (Signed)
Addended bySherryle Lis, Alphonse Asbridge R on: 01/06/2021 09:29 AM   Modules accepted: Orders

## 2021-01-06 NOTE — Telephone Encounter (Signed)
I've ordered it, thanks!

## 2021-01-06 NOTE — Telephone Encounter (Signed)
Gabrielle Wilkins stated she was supposed to be scheduled for an MRI prior to surgery but hasn't been contacted with an appointment. Please advise.

## 2021-01-09 ENCOUNTER — Telehealth: Payer: Self-pay | Admitting: Urology

## 2021-01-09 NOTE — Telephone Encounter (Signed)
DOS - 01/30/21  PLANTAR FIBROMA LEFT --- 28062  [facility] EFFECTIVE DATE - 01/09/21   PLAN DEDUCTIBLE - $350.00 W/ $350.00 REMAINING OUT OF POCKET - $6,500.00 W/ $6,485.00 REMAINING COINSURANCE - 15% COPAY -  $0.00   SPOKE WITH MICHELLE B. WITH [employer] AND SHE STATED THAT FOR CPT CODE 48250 NO PRIOR AUTH IS REQUIRED.  REF # [place]

## 2021-01-24 ENCOUNTER — Ambulatory Visit
Admission: RE | Admit: 2021-01-24 | Discharge: 2021-01-24 | Disposition: A | Discharge status: Home / Self Care | Payer: BC Managed Care – PPO | Source: Ambulatory Visit | Attending: Podiatry | Admitting: Podiatry

## 2021-01-24 DIAGNOSIS — R2242 Localized swelling, mass and lump, left lower limb: Secondary | ICD-10-CM

## 2021-01-24 DIAGNOSIS — M722 Plantar fascial fibromatosis: Secondary | ICD-10-CM

## 2021-01-24 MED ORDER — GADOBENATE DIMEGLUMINE 529 MG/ML IV SOLN
14.0000 mL | Freq: Once | INTRAVENOUS | Status: AC | PRN
  Administered 2021-01-24: 14 mL via INTRAVENOUS

## 2021-01-27 ENCOUNTER — Encounter: Payer: Self-pay | Admitting: Podiatry

## 2021-01-30 ENCOUNTER — Other Ambulatory Visit: Payer: Self-pay | Admitting: Podiatry

## 2021-01-30 DIAGNOSIS — D492 Neoplasm of unspecified behavior of bone, soft tissue, and skin: Secondary | ICD-10-CM | POA: Diagnosis not present

## 2021-01-30 MED ORDER — IBUPROFEN 600 MG PO TABS: 600.0000 mg | ORAL_TABLET | Freq: Four times a day (QID) | ORAL | 0 refills | Status: AC | PRN

## 2021-01-30 MED ORDER — OXYCODONE-ACETAMINOPHEN 5-325 MG PO TABS: 1.0000 | ORAL_TABLET | Freq: Four times a day (QID) | ORAL | 0 refills | Status: AC | PRN

## 2021-02-04 ENCOUNTER — Encounter: Payer: Self-pay | Admitting: Podiatry

## 2021-02-05 ENCOUNTER — Other Ambulatory Visit: Payer: Self-pay

## 2021-02-05 ENCOUNTER — Encounter: Payer: Self-pay | Admitting: Podiatry

## 2021-02-05 ENCOUNTER — Ambulatory Visit (INDEPENDENT_AMBULATORY_CARE_PROVIDER_SITE_OTHER): Payer: BC Managed Care – PPO | Admitting: Podiatry

## 2021-02-05 DIAGNOSIS — Q278 Other specified congenital malformations of peripheral vascular system: Secondary | ICD-10-CM

## 2021-02-08 NOTE — Progress Notes (Signed)
  Subjective:  Patient ID: Gabrielle Wilkins, female    DOB: July 29, 1976,  MRN: [id number]  Chief Complaint  Patient presents with   Routine Post Op       POV #1 DOS 01/30/2021 EXCISION OF MASS ON INSTEP LEFT FOOT     45 y.o. female returns for post-op check.  Doing well she has minimal pain  Review of Systems: Negative except as noted in the HPI. Denies N/V/F/Ch.   Objective:  There were no vitals filed for this visit. There is no height or weight on file to calculate BMI. Constitutional Well developed. Well nourished.  Vascular Foot warm and well perfused. Capillary refill normal to all digits.   Neurologic Normal speech. Oriented to person, place, and time. Epicritic sensation to light touch grossly present bilaterally.  Dermatologic Skin healing well without signs of infection. Skin edges well coapted without signs of infection.  Orthopedic: Tenderness to palpation noted about the surgical site.   Pathology results benign fibrovascular mass Assessment:   1. Vascular malformation of lower extremity    Plan:  Patient was evaluated and treated and all questions answered.  She is doing well her incisions are healing very well.  May begin bathing regularly.  Plan for suture removal in 2 weeks.  Covered with a Band-Aid today.  Continue WBAT in the CAM boot.  Return in about 2 weeks (around 02/19/2021) for suture removal .

## 2021-02-12 ENCOUNTER — Encounter: Payer: BC Managed Care – PPO | Admitting: Podiatry

## 2021-02-19 ENCOUNTER — Ambulatory Visit (INDEPENDENT_AMBULATORY_CARE_PROVIDER_SITE_OTHER): Payer: BC Managed Care – PPO | Admitting: Podiatry

## 2021-02-19 ENCOUNTER — Other Ambulatory Visit: Payer: Self-pay

## 2021-02-19 DIAGNOSIS — Q278 Other specified congenital malformations of peripheral vascular system: Secondary | ICD-10-CM

## 2021-02-23 ENCOUNTER — Encounter: Payer: Self-pay | Admitting: Podiatry

## 2021-02-23 NOTE — Progress Notes (Signed)
  Subjective:  Patient ID: Doloris Hall, female    DOB: Apr 17, 1976,  MRN: [id number]  Chief Complaint  Patient presents with   Routine Post Op     POV #1 DOS 02/13/2021 EPF LT, GASTROCNEMIUS RECESS LT     45 y.o. female returns for post-op check.  Doing well she has minimal pain  Review of Systems: Negative except as noted in the HPI. Denies N/V/F/Ch.   Objective:  There were no vitals filed for this visit. There is no height or weight on file to calculate BMI. Constitutional Well developed. Well nourished.  Vascular Foot warm and well perfused. Capillary refill normal to all digits.   Neurologic Normal speech. Oriented to person, place, and time. Epicritic sensation to light touch grossly present bilaterally.  Dermatologic Skin healing well without signs of infection. Skin edges well coapted without signs of infection.  Orthopedic: Tenderness to palpation noted about the surgical site.   Pathology results benign fibrovascular mass Assessment:   1. Vascular malformation of lower extremity     Plan:  Patient was evaluated and treated and all questions answered.  Sutures removed today she may resume regular bathing activity and shoe gear  Return if symptoms worsen or fail to improve.

## 2021-02-26 ENCOUNTER — Encounter: Payer: BC Managed Care – PPO | Admitting: Podiatry

## 2021-03-19 ENCOUNTER — Encounter: Payer: BC Managed Care – PPO | Admitting: Podiatry

## 2021-07-15 NOTE — Progress Notes (Signed)
Cardiology Office Note:    Date:  07/15/2021   ID:  Gabrielle Wilkins, DOB 1975-11-27, MRN [id number]  PCP:  Kathyrn Lass, MD   [facility] Cardiologist:  None     Referring MD: Marda Stalker, PA-C   No chief complaint on file. Palpitations  History of Present Illness:    Gabrielle Wilkins is a 45 y.o. female with a hx of IBD, anemia , hypothyroid on levothyroxine, referral for palpitations.  Patient reports palpitations. She had a smart watch that noted HR 110-120.This has been intermittent for a month. She reported this to her PCP. She denied chest pain. She prescribed metop XL 25 mg daily and it helped. She forgot to keep taking it for a few days and it returned.Her chest feels floppy.  Her symptoms are non limiting. No smoking.  She denies LH, dizziness, syncope, family hx of SCD. She has no known structural heart dx. She drinks 12 oz of coffee per day. A few drinks per week.. Mother no hx of MI. Father may have had an MI. Three brothers with high blood pressure. Brother with aneurysm and bypass. She was screened for aneurysm. Had a niece, believes had an MI at 49  Her blood pressure at home is around 130/90 mmHg . Today its 140/102 mmhg, she took her metoprolol today. She had hypertension during her pregnancy. She works in the school system.  TSH 2.5   Past Medical History:  Diagnosis Date   Anemia    Depression    Not for a very LONG time   Dyspnea    GERD (gastroesophageal reflux disease)    OCCASIONALLY   Hx of inverse psoriasis    Hypertension    Chronic Hypertension   Migraine, ophthalmoplegic    Pregnancy induced hypertension    Ulcerative colitis    Ulcerative colitis [facility])     Past Surgical History:  Procedure Laterality Date   CESAREAN SECTION  04/05/2011   Procedure: CESAREAN SECTION;  Surgeon: Logan Bores;  Location: [facility];  Service: Gynecology;  Laterality: N/A;  baby boy 42  APGAR 7/9   CHOLECYSTECTOMY  2008   COLONOSCOPY N/A     DILATION AND CURETTAGE OF UTERUS     VIDEO BRONCHOSCOPY Bilateral 11/08/2016   Procedure: VIDEO BRONCHOSCOPY WITHOUT FLUORO;  Surgeon: Rush Farmer, MD;  Location: [place];  Service: Cardiopulmonary;  Laterality: Bilateral;   VIDEO BRONCHOSCOPY WITH ENDOBRONCHIAL NAVIGATION N/A 12/15/2016   Procedure: VIDEO BRONCHOSCOPY WITH ENDOBRONCHIAL NAVIGATION;  Surgeon: Collene Gobble, MD;  Location: [facility];  Service: Thoracic;  Laterality: N/A;    Current Medications: No outpatient medications have been marked as taking for the 07/16/21 encounter (Appointment) with Janina Mayo, MD.     Allergies:   Ceclor [cefaclor], Golimumab, and Prednisone   Social History   Socioeconomic History   Marital status: Married    Spouse name: Not on file   Number of children: Not on file   Years of education: Not on file   Highest education level: Not on file  Occupational History   Not on file  Tobacco Use   Smoking status: Passive Smoke Exposure - Never Smoker   Smokeless tobacco: Never   Tobacco comments:    Father as a child  Vaping Use   Vaping Use: Never used  Substance and Sexual Activity   Alcohol use: Yes    Alcohol/week: 7.0 standard drinks    Types: 3 Standard drinks or equivalent, 4 Glasses of wine  per week   Drug use: No   Sexual activity: Yes  Other Topics Concern   Not on file  Social History Narrative   [facility] (12/09/16):   Originally from [place]. She moved to [place] in 1998. 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 No known mold exposure. Does have an outdoor cat & an indoor Geneticist, molecular. No bird exposure. No hot tub exposure. Does have a couple of indoor plants. Enjoys cooking. Married with 3 kids.    Social Determinants of Health   Financial Resource Strain: Not on file  Food Insecurity: Not on file  Transportation Needs: Not on file  Physical Activity: Not on file  Stress: Not on file  Social Connections: Not on file     Family  History: The patient's family history includes Alcoholism in her father; Arthritis/Rheumatoid in her brother; Bipolar disorder in her brother; Breast cancer in her father, maternal grandmother, and paternal grandmother; Cirrhosis in her father; Cystic kidney disease in her mother; HIV in her brother; Heart attack in her father; Heart attack (age of onset: 42) in an other family member; Hypertension in her brother, brother, brother, and mother; Pancreatic cancer in her mother; Rheum arthritis in her brother.  ROS:   Please see the history of present illness.     All other systems reviewed and are negative.  EKGs/Labs/Other Studies Reviewed:    The following studies were reviewed today:   EKG:  EKG is  ordered today.  The ekg ordered today demonstrates   EKG - NSR,  Qtc 425 ms  Recent Labs: No results found for requested labs within last 8760 hours.  Recent Lipid Panel No results found for: CHOL, TRIG, HDL, CHOLHDL, VLDL, LDLCALC, LDLDIRECT   Risk Assessment/Calculations:           Physical Exam:    VS:  There were no vitals taken for this visit.    Wt Readings from Last 3 Encounters:  09/14/18 143 lb 8 oz (65.1 kg)  06/30/18 136 lb 8 oz (61.9 kg)  05/26/18 131 lb 8 oz (59.6 kg)     GEN:  Well nourished, well developed in no acute distress HEENT: Normal NECK: No JVD; No carotid bruits LYMPHATICS: No lymphadenopathy CARDIAC: RRR, no murmurs, rubs, gallops RESPIRATORY:  Clear to auscultation without rales, wheezing or rhonchi  ABDOMEN: Soft, non-tender, non-distended MUSCULOSKELETAL:  No edema; No deformity  SKIN: Warm and dry NEUROLOGIC:  Alert and oriented x 3 PSYCHIATRIC:  Normal affect   ASSESSMENT:   #Palpitations: She notes intermittent palpitations. She does not have high risk features including syncope c/f arrhythmia , family hx of SCD, or abnormalities on her EKG. Her symptoms improved on metop 25 mg XL. Can continue this medication.  #HTN: borderline control.  Will increase lisinopril to 20 mg daily and HCTz to 25 mg.  PLAN:    In order of problems listed above:  Per above Follow up in 3 months      Medication Adjustments/Labs and Tests Ordered: Current medicines are reviewed at length with the patient today.  Concerns regarding medicines are outlined above.   Signed, Janina Mayo, MD  07/15/2021 7:13 PM    [facility]

## 2021-07-16 ENCOUNTER — Encounter: Payer: Self-pay | Admitting: Internal Medicine

## 2021-07-16 ENCOUNTER — Ambulatory Visit (INDEPENDENT_AMBULATORY_CARE_PROVIDER_SITE_OTHER): Payer: BC Managed Care – PPO | Admitting: Internal Medicine

## 2021-07-16 ENCOUNTER — Other Ambulatory Visit: Payer: Self-pay

## 2021-07-16 VITALS — BP 142/102 | HR 70 | Ht 62.0 in | Wt 162.6 lb

## 2021-07-16 DIAGNOSIS — R002 Palpitations: Secondary | ICD-10-CM

## 2021-07-16 MED ORDER — LISINOPRIL-HYDROCHLOROTHIAZIDE 20-25 MG PO TABS: 1.0000 | ORAL_TABLET | Freq: Every day | ORAL | 3 refills | Status: DC

## 2021-07-16 NOTE — Patient Instructions (Signed)
Medication Instructions:  START: LISINOPRIL HYDROCHLOROTHIAZIDE - 31m-25mg ONCE DAILY *If you need a refill on your cardiac medications before your next appointment, please call your pharmacy*  Follow-Up: [facility] you and your health needs are our priority.  As part of our continuing mission to provide you with exceptional heart care, we have created designated Provider Care Teams.  These Care Teams include your primary Cardiologist (physician) and Advanced Practice Providers (APPs -  Physician Assistants and Nurse Practitioners) who all work together to provide you with the care you need, when you need it.  Your next appointment:   3 month(s)  The format for your next appointment:   In Person  Provider:   BJanina Mayo MD

## 2021-09-11 ENCOUNTER — Telehealth: Payer: Self-pay | Admitting: Internal Medicine

## 2021-09-11 MED ORDER — LISINOPRIL-HYDROCHLOROTHIAZIDE 20-25 MG PO TABS: 1.0000 | ORAL_TABLET | Freq: Every day | ORAL | 1 refills | Status: DC

## 2021-09-11 NOTE — Telephone Encounter (Signed)
°*  STAT* If patient is at the pharmacy, call can be transferred to refill team.   1. Which medications need to be refilled? (please list name of each medication and dose if known) lisinopril-hydrochlorothiazide (ZESTORETIC) 20-25 MG tablet  2. Which pharmacy/location (including street and city if local pharmacy) is medication to be sent to? [place] [id number], [address]  3. Do they need a 30 day or 90 day supply? 90 day   Patient states prescription was never received by the pharmacy

## 2021-09-11 NOTE — Telephone Encounter (Signed)
Refills has been sent to the pharmacy. 

## 2021-10-26 ENCOUNTER — Ambulatory Visit: Payer: BC Managed Care – PPO | Admitting: Internal Medicine

## 2021-11-02 ENCOUNTER — Other Ambulatory Visit: Payer: Self-pay | Admitting: Family Medicine

## 2021-11-02 DIAGNOSIS — Z1231 Encounter for screening mammogram for malignant neoplasm of breast: Secondary | ICD-10-CM

## 2021-12-07 ENCOUNTER — Encounter: Payer: Self-pay | Admitting: Internal Medicine

## 2021-12-07 ENCOUNTER — Ambulatory Visit
Admission: RE | Admit: 2021-12-07 | Discharge: 2021-12-07 | Disposition: A | Discharge status: Home / Self Care | Payer: BC Managed Care – PPO | Source: Ambulatory Visit | Attending: Family Medicine | Admitting: Family Medicine

## 2021-12-07 ENCOUNTER — Ambulatory Visit (INDEPENDENT_AMBULATORY_CARE_PROVIDER_SITE_OTHER): Payer: BC Managed Care – PPO | Admitting: Internal Medicine

## 2021-12-07 VITALS — BP 126/90 | HR 71 | Ht 62.5 in | Wt 171.6 lb

## 2021-12-07 DIAGNOSIS — Z1231 Encounter for screening mammogram for malignant neoplasm of breast: Secondary | ICD-10-CM

## 2021-12-07 DIAGNOSIS — R002 Palpitations: Secondary | ICD-10-CM | POA: Diagnosis not present

## 2021-12-07 NOTE — Progress Notes (Signed)
?Cardiology Office Note:   ? ?Date:  12/07/2021  ? ?ID:  Gabrielle Wilkins, DOB 21-Nov-1975, MRN [id number] ? ?PCP:  Kathyrn Lass, MD ?  ?[facility] Providers ?Cardiologist:  Janina Mayo, MD    ? ?Referring MD: Kathyrn Lass, MD  ? ?No chief complaint on file. ?Palpitations ? ?History of Present Illness:   ? ?Gabrielle Wilkins is a 46 y.o. female with a hx of IBD, anemia , hypothyroid on levothyroxine, referral for palpitations. ? ?Patient reports palpitations. She had a smart watch that noted HR 110-120.This has been intermittent for a month. She reported this to her PCP. She denied chest pain. She prescribed metop XL 25 mg daily and it helped. She forgot to keep taking it for a few days and it returned.Her chest feels floppy.  Her symptoms are non limiting. No smoking. ? ?She denies LH, dizziness, syncope, family hx of SCD. She has no known structural heart dx. She drinks 12 oz of coffee per day. A few drinks per week.. Mother no hx of MI. Father may have had an MI. Three brothers with high blood pressure. Brother with aneurysm and bypass. She was screened for aneurysm. Had a niece, believes had an MI at 30 ? ?Her blood pressure at home is around 130/90 mmHg . Today its 140/102 mmhg, she took her metoprolol today. She had hypertension during her pregnancy. She works in the school system. ? ?TSH 2.5 ? ?Interim Hx: ?She notes times when she can have palpitations if she takes her metoprolol later in the day. No syncope. She notes some HA. She denies dizziness.  She is trying to cut her caffeine.  ? ? ?Past Medical History:  ?Diagnosis Date  ? Anemia   ? Depression   ? Not for a very LONG time  ? Dyspnea   ? GERD (gastroesophageal reflux disease)   ? OCCASIONALLY  ? Hx of inverse psoriasis   ? Hypertension   ? Chronic Hypertension  ? Migraine, ophthalmoplegic   ? Pregnancy induced hypertension   ? Ulcerative colitis   ? Ulcerative colitis ([place])   ? ? ?Past Surgical History:  ?Procedure Laterality Date  ? CESAREAN  SECTION  04/05/2011  ? Procedure: CESAREAN SECTION;  Surgeon: Logan Bores;  Location: [facility];  Service: Gynecology;  Laterality: N/A;  baby boy 35  APGAR 7/9  ? CHOLECYSTECTOMY  2008  ? COLONOSCOPY N/A   ? DILATION AND CURETTAGE OF UTERUS    ? VIDEO BRONCHOSCOPY Bilateral 11/08/2016  ? Procedure: VIDEO BRONCHOSCOPY WITHOUT FLUORO;  Surgeon: Rush Farmer, MD;  Location: [place];  Service: Cardiopulmonary;  Laterality: Bilateral;  ? VIDEO BRONCHOSCOPY WITH ENDOBRONCHIAL NAVIGATION N/A 12/15/2016  ? Procedure: VIDEO BRONCHOSCOPY WITH ENDOBRONCHIAL NAVIGATION;  Surgeon: Collene Gobble, MD;  Location: [place];  Service: Thoracic;  Laterality: N/A;  ? ? ?Current Medications: ?No outpatient medications have been marked as taking for the 12/07/21 encounter (Appointment) with Janina Mayo, MD.  ?  ? ?Allergies:   Ceclor [cefaclor], Golimumab, and Prednisone  ? ?Social History  ? ?Socioeconomic History  ? Marital status: Married  ?  Spouse name: Not on file  ? Number of children: Not on file  ? Years of education: Not on file  ? Highest education level: Not on file  ?Occupational History  ? Not on file  ?Tobacco Use  ? Smoking status: Never  ?  Passive exposure: Yes  ? Smokeless tobacco: Never  ? Tobacco comments:  ?  Father  as a child  ?Vaping Use  ? Vaping Use: Never used  ?Substance and Sexual Activity  ? Alcohol use: Yes  ?  Alcohol/week: 7.0 standard drinks  ?  Types: 3 Standard drinks or equivalent, 4 Glasses of wine per week  ? Drug use: No  ? Sexual activity: Yes  ?Other Topics Concern  ? Not on file  ?Social History Narrative  ?  Pulmonary (12/09/16):  ? Originally from [place]. She moved to [place] in 1998. 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 No known mold exposure. Does have an outdoor cat & an indoor Geneticist, molecular. No bird exposure. No hot tub exposure. Does have a couple of indoor plants. Enjoys cooking. Married with 3 kids.   ? ?Social Determinants of Health   ? ?Financial Resource Strain: Not on file  ?Food Insecurity: Not on file  ?Transportation Needs: Not on file  ?Physical Activity: Not on file  ?Stress: Not on file  ?Social Connections: Not on file  ?  ? ?Family History: ?The patient's family history includes Alcoholism in her father; Arthritis/Rheumatoid in her brother; Bipolar disorder in her brother; Breast cancer in her father, maternal grandmother, and paternal grandmother; Cirrhosis in her father; Cystic kidney disease in her mother; HIV in her brother; Heart attack in her father; Heart attack (age of onset: 64) in an other family member; Hypertension in her brother, brother, brother, and mother; Pancreatic cancer in her mother; Rheum arthritis in her brother. ? ?ROS:   ?Please see the history of present illness.    ? All other systems reviewed and are negative. ? ?EKGs/Labs/Other Studies Reviewed:   ? ?The following studies were reviewed today: ? ? ?EKG:  EKG is  ordered today.  The ekg ordered today demonstrates  ? ?EKG - NSR,  Qtc 425 ms ? ?Recent Labs: ?No results found for requested labs within last 8760 hours.  ?Recent Lipid Panel ?No results found for: CHOL, TRIG, HDL, CHOLHDL, VLDL, LDLCALC, LDLDIRECT ? ? ?Risk Assessment/Calculations:   ?  ? ?    ? ?Physical Exam:   ? ?VS:   ? ?Vitals:  ? 12/07/21 0805  ?BP: 126/90  ?Pulse: 71  ?SpO2: 98%  ? ? ? ?Wt Readings from Last 3 Encounters:  ?07/16/21 162 lb 9.6 oz (73.8 kg)  ?09/14/18 143 lb 8 oz (65.1 kg)  ?06/30/18 136 lb 8 oz (61.9 kg)  ?  ? ?GEN:  Well nourished, well developed in no acute distress ?HEENT: Normal ?NECK: No JVD; No carotid bruits ?LYMPHATICS: No lymphadenopathy ?CARDIAC: RRR, no murmurs, rubs, gallops ?RESPIRATORY:  Clear to auscultation without rales, wheezing or rhonchi  ?ABDOMEN: Soft, non-tender, non-distended ?MUSCULOSKELETAL:  No edema; No deformity  ?SKIN: Warm and dry ?NEUROLOGIC:  Alert and oriented x 3 ?PSYCHIATRIC:  Normal affect  ? ?ASSESSMENT:   ?#Palpitations: She notes  intermittent palpitations. She does not have high risk features including syncope c/f arrhythmia , family hx of SCD, or abnormalities on her EKG. Her symptoms improved on metop 25 mg XL. Can continue this medication. Her symptoms are benign. We discussed cutting caffeine, hydration, and improving exercise. We discussed that if her symptoms worsen she can come back and we can monitor this. Otherwise, she can follow up as needed. If in the future she wants to trial off the BB, we can try this too. ? ?#HTN: Good control. Increased her lisinopril to 20 mg daily and HCTz to 25 mg during her last visit.  ? ?PLAN:   ? ?  In order of problems listed above: ? ?Follow up as needed ? ?   ? ?Medication Adjustments/Labs and Tests Ordered: ?Current medicines are reviewed at length with the patient today.  Concerns regarding medicines are outlined above.  ? ?Signed, ?Janina Mayo, MD  ?12/07/2021 7:51 AM    ?[place] ? ?

## 2021-12-07 NOTE — Patient Instructions (Addendum)
Medication Instructions:  ?No Changes In Medications at this time.  ?*If you need a refill on your cardiac medications before your next appointment, please call your pharmacy* ? ?Follow-Up: ?At [facility], you and your health needs are our priority.  As part of our continuing mission to provide you with exceptional heart care, we have created designated Provider Care Teams.  These Care Teams include your primary Cardiologist (physician) and Advanced Practice Providers (APPs -  Physician Assistants and Nurse Practitioners) who all work together to provide you with the care you need, when you need it. ? ?Your next appointment:   ?AS NEEDED  ? ?If your symptoms get worse on metoprolol, you noticed increased light headedness, dizziness or fainting spells we can monitor your rhythm. We discussed cutting caffeine, increased exercise and hydration as ways to help with your palpitations. Otherwise, you can continue to follow with your PCP. ? ?The format for your next appointment:   ?In Person ? ?Provider:   ?Janina Mayo, MD   ?  ?

## 2022-09-10 ENCOUNTER — Other Ambulatory Visit: Payer: Self-pay | Admitting: Family Medicine

## 2022-09-10 DIAGNOSIS — N920 Excessive and frequent menstruation with regular cycle: Secondary | ICD-10-CM

## 2022-09-18 ENCOUNTER — Other Ambulatory Visit: Payer: Self-pay | Admitting: Internal Medicine

## 2022-10-01 ENCOUNTER — Ambulatory Visit
Admission: RE | Admit: 2022-10-01 | Discharge: 2022-10-01 | Disposition: A | Discharge status: Home / Self Care | Payer: BC Managed Care – PPO | Source: Ambulatory Visit | Attending: Family Medicine | Admitting: Family Medicine

## 2022-10-01 DIAGNOSIS — N920 Excessive and frequent menstruation with regular cycle: Secondary | ICD-10-CM

## 2022-10-25 ENCOUNTER — Other Ambulatory Visit: Payer: Self-pay | Admitting: Family Medicine

## 2022-10-25 DIAGNOSIS — Z1231 Encounter for screening mammogram for malignant neoplasm of breast: Secondary | ICD-10-CM

## 2022-11-29 DIAGNOSIS — J189 Pneumonia, unspecified organism: Secondary | ICD-10-CM

## 2022-11-29 HISTORY — DX: Pneumonia, unspecified organism: J18.9

## 2022-12-09 ENCOUNTER — Other Ambulatory Visit: Payer: Self-pay | Admitting: Family Medicine

## 2022-12-09 ENCOUNTER — Ambulatory Visit
Admission: RE | Admit: 2022-12-09 | Discharge: 2022-12-09 | Disposition: A | Discharge status: Home / Self Care | Payer: BC Managed Care – PPO | Source: Ambulatory Visit | Attending: Family Medicine | Admitting: Family Medicine

## 2022-12-09 DIAGNOSIS — J189 Pneumonia, unspecified organism: Secondary | ICD-10-CM

## 2022-12-10 ENCOUNTER — Ambulatory Visit
Admission: RE | Admit: 2022-12-10 | Discharge: 2022-12-10 | Disposition: A | Discharge status: Home / Self Care | Payer: BC Managed Care – PPO | Source: Ambulatory Visit | Attending: Family Medicine | Admitting: Family Medicine

## 2022-12-10 ENCOUNTER — Encounter: Payer: Self-pay | Admitting: Hematology and Oncology

## 2022-12-10 DIAGNOSIS — Z1231 Encounter for screening mammogram for malignant neoplasm of breast: Secondary | ICD-10-CM

## 2023-04-07 HISTORY — PX: COLONOSCOPY: SHX174

## 2023-04-22 ENCOUNTER — Other Ambulatory Visit: Payer: Self-pay

## 2023-04-22 ENCOUNTER — Encounter (HOSPITAL_BASED_OUTPATIENT_CLINIC_OR_DEPARTMENT_OTHER): Payer: Self-pay | Admitting: Obstetrics and Gynecology

## 2023-04-22 DIAGNOSIS — Z01812 Encounter for preprocedural laboratory examination: Secondary | ICD-10-CM | POA: Diagnosis not present

## 2023-04-22 DIAGNOSIS — Z01818 Encounter for other preprocedural examination: Secondary | ICD-10-CM | POA: Diagnosis present

## 2023-04-22 DIAGNOSIS — Z01810 Encounter for preprocedural cardiovascular examination: Secondary | ICD-10-CM | POA: Diagnosis not present

## 2023-04-22 NOTE — Progress Notes (Signed)
Your procedure is scheduled on Wednesday, 05/11/2023.  Report to [facility] AT  9:30 AM.   Call this number if you have problems the morning of surgery  :[phone].   OUR ADDRESS IS [address].  WE ARE LOCATED IN [facility].  PLEASE BRING YOUR INSURANCE CARD AND PHOTO ID DAY OF SURGERY.  ONLY 2 PEOPLE ARE ALLOWED IN  WAITING  ROOM                                      REMEMBER:  DO NOT EAT FOOD, CANDY GUM OR MINTS  AFTER MIDNIGHT THE NIGHT BEFORE YOUR SURGERY . YOU MAY HAVE CLEAR LIQUIDS FROM MIDNIGHT THE NIGHT BEFORE YOUR SURGERY UNTIL  8:30 AM. NO CLEAR LIQUIDS AFTER   8:30 AM DAY OF SURGERY.  YOU MAY  BRUSH YOUR TEETH MORNING OF SURGERY AND RINSE YOUR MOUTH OUT, NO CHEWING GUM CANDY OR MINTS.     CLEAR LIQUID DIET    Allowed      Water                                                                   Coffee and tea, regular and decaf  (NO cream or milk products of any type, may sweeten)                         Carbonated beverages, regular and diet                                    Sports drinks like Gatorade _____________________________________________________________________     TAKE ONLY THESE MEDICATIONS MORNING OF SURGERY: Levothyroxine, Metoprolol, Xeljanz, Pravastatin, Birth control pill, Pantoprazole                                        DO NOT WEAR JEWERLY/  METAL/  PIERCINGS (INCLUDING NO PLASTIC PIERCINGS) DO NOT WEAR LOTIONS, POWDERS, PERFUMES OR NAIL POLISH ON YOUR FINGERNAILS. TOENAIL POLISH IS OK TO WEAR. DO NOT SHAVE FOR 48 HOURS PRIOR TO DAY OF SURGERY.  CONTACTS, GLASSES, OR DENTURES MAY NOT BE WORN TO SURGERY.  REMEMBER: NO SMOKING, VAPING ,  DRUGS OR ALCOHOL FOR 24 HOURS BEFORE YOUR SURGERY.                                    [facility] IS NOT RESPONSIBLE  FOR ANY BELONGINGS.                                                                    Marland Kitchen           [facility] - Preparing for  Surgery Before surgery, you can play an important role.  Because skin is not sterile, your skin needs to be as free of germs as possible.  You can reduce the number of germs on your skin by washing with CHG (chlorahexidine gluconate) soap before surgery.  CHG is an antiseptic cleaner which kills germs and bonds with the skin to continue killing germs even after washing. Please DO NOT use if you have an allergy to CHG or antibacterial soaps.  If your skin becomes reddened/irritated stop using the CHG and inform your nurse when you arrive at Short Stay. Do not shave (including legs and underarms) for at least 48 hours prior to the first CHG shower.  You may shave your face/neck. Please follow these instructions carefully:  1.  Shower with CHG Soap the night before surgery and the  morning of Surgery.  2.  If you choose to wash your hair, wash your hair first as usual with your  normal  shampoo.  3.  After you shampoo, rinse your hair and body thoroughly to remove the  shampoo.                                        4.  Use CHG as you would any other liquid soap.  You can apply chg directly  to the skin and wash , chg soap provided, night before and morning of your surgery.  5.  Apply the CHG Soap to your body ONLY FROM THE NECK DOWN.   Do not use on face/ open                           Wound or open sores. Avoid contact with eyes, ears mouth and genitals (private parts).                       Wash face,  Genitals (private parts) with your normal soap.             6.  Wash thoroughly, paying special attention to the area where your surgery  will be performed.  7.  Thoroughly rinse your body with warm water from the neck down.  8.  DO NOT shower/wash with your normal soap after using and rinsing off  the CHG Soap.             9.  Pat yourself dry with a clean towel.            10.  Wear clean pajamas.            11.  Place clean sheets on your bed the night of your first shower and do not  sleep with  pets. Day of Surgery : Do not apply any lotions/ powders the morning of surgery.  Please wear clean clothes to the hospital/surgery center.  IF YOU HAVE ANY SKIN IRRITATION OR PROBLEMS WITH THE SURGICAL SOAP, PLEASE GET A BAR OF GOLD DIAL SOAP AND SHOWER THE NIGHT BEFORE YOUR SURGERY AND THE MORNING OF YOUR SURGERY. PLEASE LET THE NURSE KNOW MORNING OF YOUR SURGERY IF YOU HAD ANY PROBLEMS WITH THE SURGICAL SOAP.   YOUR SURGEON MAY HAVE REQUESTED EXTENDED RECOVERY TIME AFTER YOUR SURGERY. IT COULD BE A  JUST A FEW HOURS  UP TO AN OVERNIGHT STAY.  YOUR SURGEON SHOULD HAVE DISCUSSED  THIS WITH YOU PRIOR TO YOUR SURGERY. IN THE EVENT YOU NEED TO STAY OVERNIGHT PLEASE REFER TO THE FOLLOWING GUIDELINES. YOU MAY HAVE UP TO 4 VISITORS  MAY VISIT IN THE EXTENDED RECOVERY ROOM UNTIL 800 PM ONLY.  ONE  VISITOR AGE 47 AND OVER MAY SPEND THE NIGHT AND MUST BE IN EXTENDED RECOVERY ROOM NO LATER THAN 800 PM . YOUR DISCHARGE TIME AFTER YOU SPEND THE NIGHT IS 900 AM THE MORNING AFTER YOUR SURGERY. YOU MAY PACK A SMALL OVERNIGHT BAG WITH TOILETRIES FOR YOUR OVERNIGHT STAY IF YOU WISH.  REGARDLESS OF IF YOU STAY OVER NIGHT OR ARE DISCHARGED THE SAME DAY YOU WILL BE REQUIRED TO HAVE A RESPONSIBLE ADULT (18 YRS OLD OR OLDER) STAY WITH YOU FOR AT LEAST THE FIRST 24 HOURS  YOUR PRESCRIPTION MEDICATIONS WILL BE PROVIDED DURING YOUR HOSPITAL STAY.  ________________________________________________________________________                                                        QUESTIONS Mechele Claude PRE OP NURSE PHONE [phone].

## 2023-04-22 NOTE — Progress Notes (Signed)
Spoke w/ via phone for pre-op interview---Gabrielle Wilkins needs dos----urine pregnancy per anesthesia, surgeon orders pending             Wilkins results------05/06/23 Wilkins appt for cbc, type & screen, bmp, EKG, 12/09/22 chest xray COVID test -----patient states asymptomatic no test needed Arrive at -------0930 on Wednesday, 05/11/2023 NPO after MN NO Solid Food.  Clear liquids from MN until---0830 Med rec completed Medications to take morning of surgery -----Synthroid, Metoprolol, Xeljanz, Pravastatin, Norethindrone, Protonix Diabetic medication -----n/a Patient instructed no nail polish to be worn day of surgery Patient instructed to bring photo id and insurance card day of surgery Patient aware to have Driver (ride ) / caregiver    for 24 hours after surgery - husband, Jeri Modena Patient Special Instructions -----Extended / overnight stay instructions given. Pre-Op special Instructions -----Requested orders from Dr. Richardson Dopp via Epic IB on 04/22/23. Patient verbalized understanding of instructions that were given at this phone interview. Patient denies shortness of breath, chest pain, fever, cough at this phone interview.

## 2023-05-05 ENCOUNTER — Encounter: Payer: Self-pay | Admitting: Hematology and Oncology

## 2023-05-06 ENCOUNTER — Encounter (HOSPITAL_COMMUNITY)
Admission: RE | Admit: 2023-05-06 | Discharge: 2023-05-06 | Disposition: A | Discharge status: Home / Self Care | Payer: BC Managed Care – PPO | Source: Ambulatory Visit | Attending: Obstetrics and Gynecology | Admitting: Obstetrics and Gynecology

## 2023-05-06 DIAGNOSIS — Z01810 Encounter for preprocedural cardiovascular examination: Secondary | ICD-10-CM | POA: Insufficient documentation

## 2023-05-06 DIAGNOSIS — Z01812 Encounter for preprocedural laboratory examination: Secondary | ICD-10-CM | POA: Diagnosis not present

## 2023-05-06 DIAGNOSIS — Z01818 Encounter for other preprocedural examination: Secondary | ICD-10-CM

## 2023-05-06 LAB — BASIC METABOLIC PANEL
Anion gap: 9 (ref 5–15)
BUN: 8 mg/dL (ref 6–20)
CO2: 23 mmol/L (ref 22–32)
Calcium: 8.8 mg/dL — ABNORMAL LOW (ref 8.9–10.3)
Chloride: 103 mmol/L (ref 98–111)
Creatinine, Ser: 0.59 mg/dL (ref 0.44–1.00)
GFR, Estimated: >60 mL/min (ref >60)
Glucose, Bld: 89 mg/dL (ref 70–99)
Potassium: 4.3 mmol/L (ref 3.5–5.1)
Sodium: 135 mmol/L (ref 135–145)

## 2023-05-06 LAB — CBC
HCT: 32.5 % — ABNORMAL LOW (ref 36.0–46.0)
Hemoglobin: 10.5 g/dL — ABNORMAL LOW (ref 12.0–15.0)
MCH: 28.5 pg (ref 26.0–34.0)
MCHC: 32.3 g/dL (ref 30.0–36.0)
MCV: 88.1 fL (ref 80.0–100.0)
Platelets: 498 10*3/uL — ABNORMAL HIGH (ref 150–400)
RBC: 3.69 MIL/uL — ABNORMAL LOW (ref 3.87–5.11)
RDW: 13.5 % (ref 11.5–15.5)
WBC: 9.3 10*3/uL (ref 4.0–10.5)
nRBC: 0 % (ref 0.0–0.2)

## 2023-05-08 ENCOUNTER — Other Ambulatory Visit: Payer: Self-pay | Admitting: Obstetrics and Gynecology

## 2023-05-08 DIAGNOSIS — D219 Benign neoplasm of connective and other soft tissue, unspecified: Secondary | ICD-10-CM

## 2023-05-08 NOTE — H&P (Deleted)
  The note originally documented on this encounter has been moved the the encounter in which it belongs.  

## 2023-05-08 NOTE — H&P (Signed)
Subjective:    Chief Complaint(s): Preop/ Menorrhagia and fibroids     HPI:      [name] presents for preop visit. Pt is schedule for a robotic assisted laparoscopic hysterectomy with bilateral salpingectomy on 05/11/2023 for the management of menorrhagia and fibroids.  --- IN REVIEW:  ReportEd several months of heavy menses. started June/ july of 2023. she was prescribed a low dose POP and had bleeeding constantly. reports 14 days of bleeding in the month of March with 6 of them being heavy. ( changing a super tampon and pad every 30 minutes on her heaviest day)  --- TVUS 10/01/2022 notable for uterus measuring 10.6 x 6.9 x 8.3 cm. 2 subserosal leiomyomata observed anterior wall measuring 1.9 cm and 2.5 cm. Additional intramural leiomyomata at fundus measuring 1.3 cm and 1.2 cm. Heterogenous myometrium. Endometrium 4 mm without mass. Right ovary not visualized. Left ovary normal. No free fluid nor adnexal mass seen. Pap 12/2019 NILM. TSH 06/07/2022 WNL. CBC with differential 09/10/2022 WNL. No new partners. No abnormal vaginal discharge, odor, or irritation. EMB on 11/01/2022 was unsuccessful.      Current Medication:     Taking Calci-Chew. Cyanocobalamin 2500 MCG Tablet as directed Orally Once a day. Iron (Ferrous Sulfate) 325 (65 Fe) MG Tablet 1 tablet Orally Once a day , Notes to Pharmacist: OTC. Levothyroxine Sodium 50 MCG Tablet 1 tablet in the morning on an empty stomach Orally Once a day. Lisinopril-hydroCHLOROthiazide 20-25 MG Tablet TAKE ONE TABLET BY MOUTH DAILY , Notes to Pharmacist: Gabrielle Wilkins. Metoprolol Succinate ER 25 MG Tablet Extended Release 24 Hour 1 tablet Orally Once a day. Multi Vitamin Daily - Tablet 1 tablet Orally Once a day. Norethindrone 0.35 MG Tablet 1 tablet Orally Once a day. Pantoprazole Sodium 40 MG Tablet Delayed Release 1 tablet Orally Once a day , Notes to Pharmacist: Gabrielle Wilkins. Pravastatin Sodium 10 MG Tablet 1 tablet Orally Once a day. Xeljanz(Tofacitinib  Citrate) 5 MG Tablet 1 tablet Orally Once a day , Notes to Pharmacist: Gabrielle Wilkins.     Not-Taking Albuterol Sulfate HFA 108 (90 Base) MCG/ACT Aerosol Solution 1 puff as needed Inhalation every 4 hrs. Benzonatate 100 MG Capsule 1 capsule as needed Orally Three times a day. Medication List reviewed and reconciled with the patient.    Medical History: hypertension ulcerative colitis, (Dr. Elnoria Wilkins) ophthalmologic migraine (Dr. Lucretia Wilkins) hypercholesterol Brother, 20 years older, history of AAA, RA pulmonary nodules, multiple irregular uncertain etiology (Dr. Delton Wilkins) hypothyroidism Anemia, stool neg, ? menses 01/2021 Nl colon Gabrielle Wilkins, repeat 2 years 03/12/2021; Colon 03/2023 normal Gabrielle Wilkins repeat 2 yrs GYN Gabrielle Wilkins, Needs pap 2024, Prevnar 20, ? shingrix    Allergies/Intolerance: Cefaclor: Allergy - hives Prednisone: Side Effects - avoid per her ophthalmologist, may cause blindness Simponi: Allergy - hives/facial swelling Entyvio: Allergy - hives Remicade: Allergy - skin irritation    Gyn History: Sexual activity currently sexually active.  Periods : irregular.  LMP 04/07/2023 and 04/24/2023 periods about every other week.  Birth control Norethindrone, condoms.  Last pap smear date 12/2019 NILM.  Last mammogram date 11/2022- normal per pt.  Abnormal pap smear 20+ years ago.  Denies STD.  Menarche 11/12.     OB History: Number of pregnancies  4.  abortion  1.  Pregnancy # 1  boy, vaginal delivery.  Pregnancy # 2  boy, vaginal delivery.  Pregnancy # 3  boy, C-section.     Surgical History: wisdom tooth extraction cholecystectomy 2008 c-section 2012 colonoscopy x 2 Dilation and currettage 2010  Hospitalization: childbirth    Family History: Father: deceased 62 yrs, alcoholism, MI, drowning, diagnosed with Breast cancer, Cirrhosis, Hypertension Mother: deceased, pancreatic cancer, diagnosed with Hypertension, Diabetes Paternal Grand Father: 56 yrs, MI, diagnosed with CVA Maternal Grand Mother: breast  cancer 90s, diagnosed with Breast cancer Brother 1: alive 31 yrs, hypertension, diagnosed with Hypertension Brother2: alive 76 yrs, RA,quad bypass, diagnosed with Hypertension Brother 3: alive 25 yrs, HIV positive, bipolar,quad bypass, diagnosed with Hypertension Children: alive, x3, all healthy Paternal Grand Mother: diagnosed with Breast cancer    Social History:      General Tobacco use:   cigarettes:  Never smoked, Tobacco history last updated  04/28/2023, Vaping  No.  Alcohol:  yes wine, beer - maybe 5 drinks a week. Caffeine:  yes 2 servings daily, coffee. Recreational drug use:  no.   DIET:  plant based. Exercise:  5x a week - pure barre. Marital Status:  married.   Children:  3 sons Gabrielle Wilkins,Gabrielle Wilkins 01/12/2021 03:41:48 PM > 20, 16 and 45 yo. Oldest will be senior at [employer].. EDUCATION:  College.   OCCUPATION:  employed assistant principal Gabrielle Wilkins elem Gabrielle Wilkins,Gabrielle Wilkins 01/12/2021 03:41:44 PM > Gabrielle Wilkins,Gabrielle Wilkins 02/11/2022 08:51:38 AM > moving to new school, close to her house, asst prin. Can walk there, her kids went there..    ROS:      CONSTITUTIONAL Chills  No.  Fatigue  No.  Fever  No.  Night sweats  No.  Recent travel outside [place]  No.  Sweats  No.  Weight change  No.       OPHTHALMOLOGY Blurring of vision  no.  Change in vision  no.  Double vision  no.       ENT Dizziness  no.  Nose bleeds  no.  Sore throat  no.  Teeth pain  no.       ALLERGY Hives  no.       CARDIOLOGY Chest pain  no.  High blood pressure  no.  Irregular heart beat  no.  Leg edema  no.  Palpitations  no.       RESPIRATORY Shortness of breath  no.  Cough  no.  Wheezing  no.       UROLOGY Pain with urination  no.  Urinary urgency  no.  Urinary frequency  no.  Urinary incontinence  no.  Difficulty urinating  No.  Blood in urine  No.       GASTROENTEROLOGY Abdominal pain  no.  Appetite change  no.  Bloating/belching  no.  Blood in stool or on toilet paper  no.  Change in bowel movements  no.  Constipation  no.  Diarrhea  no.   Difficulty swallowing  no.  Nausea  no.       FEMALE REPRODUCTIVE Vulvar pain  no.  Vulvar rash  no.  Abnormal vaginal bleeding  , heavy menses.  Breast pain  no.  Nipple discharge  no.  Pain with intercourse  no.  Pelvic pain  no.  Unusual vaginal discharge  no.  Vaginal itching  no.       MUSCULOSKELETAL Muscle aches  no.       NEUROLOGY Headache  no.  Tingling/numbness  no.  Weakness  no.       PSYCHOLOGY Depression  no.  Anxiety  no.  Nervousness  no.  Sleep disturbances  no.  Suicidal ideation  no .       ENDOCRINOLOGY Excessive thirst  no.  Excessive urination  no.  Hair  loss  no.  Heat or cold intolerance  no.       HEMATOLOGY/LYMPH Abnormal bleeding  no.  Easy bruising  no.  Swollen glands  no.       DERMATOLOGY New/changing skin lesion  no.  Rash  no.  Sores  no.  Negative except as stated in HPI.   Objective:    Vitals: Wt: 150.2, Wt change: 10.4 lbs, Ht: 62.5, BMI: 27.03, Pulse sitting: 74, BP sitting: 113/81.    Past Results:    Examination:      General Examination CONSTITUTIONAL: alert, oriented, NAD.  SKIN:  moist, warm.  EYES:  Conjunctiva clear.  LUNGS: good I:E efffort noted, clear to auscultation bilaterally.  HEART: regular rate and rhythm.  ABDOMEN: soft, non-tender/non-distended, bowel sounds present.  FEMALE GENITOURINARY: normal external genitalia, labia - unremarkable, vagina - pink moist mucosa, no lesions or abnormal discharge, cervix - no discharge or lesions or CMT, adnexa - no masses or tenderness, uterus - nontender and  12 week size on palpation.  EXTREMITIES: no edema present.  PSYCH:  affect normal, good eye contact.     Physical Examination:      Chaperone present Chaperone present Debby Freiberg 04/28/2023 04:24:53 PM >, for pelvic exam.    Assessment:    Assessment: Menorrhagia with irregular cycle - N92.1 (Primary)    Fibroid - D25.9      Plan:    Treatment:     Menorrhagia with irregular cycle     Notes: Planning robotic-assisted  laparoscopic hysterectomy with bilateral salpingectomy Pt advised she may stay overnight, or 2 days if conversion to larger incision. Discussed risks of hysterectomy including but not limited to infection, bleeding, conversion to larger incision, damage to her bowel, bladder, or ureters, with the need for further surgery. Discussed risk of blood transfusion and risk of HIV or hep B&C ( with blood transfusion. Pt is aware of risks and desires blood transfusion if needed. Pt advised to avoid NSAIDs (Aspirin, Aleve, Advil, Ibuprofen, Motrin) from now until surgery given risk of bleeding during surgery. She may take Tylenol for pain management. She is advised to avoid eating or drinking starting midnight prior to surgery. Discussed post-surgery avoidance of driving for 1 week and avoidance of lifting weight greater than 10 lbs or intercourse for 6-8 weeks after procedure.     Fibroid     Notes: Planning robotic-assisted laparoscopic hysterectomy with bilateral salpingectomy Pt advised she may stay overnight, or 2 days if conversion to larger incision. Discussed risks of hysterectomy including but not limited to infection, bleeding, conversion to larger incision, damage to her bowel, bladder, or ureters, with the need for further surgery. Discussed risk of blood transfusion and risk of HIV or hep B&C ( with blood transfusion. Pt is aware of risks and desires blood transfusion if needed. Pt advised to avoid NSAIDs (Aspirin, Aleve, Advil, Ibuprofen, Motrin) from now until surgery given risk of bleeding during surgery. She may take Tylenol for pain management. She is advised to avoid eating or drinking starting midnight prior to surgery. Discussed post-surgery avoidance of driving for 1 week and avoidance of lifting weight greater than 10 lbs or intercourse for 6-8 weeks after procedure.

## 2023-05-11 ENCOUNTER — Encounter (HOSPITAL_BASED_OUTPATIENT_CLINIC_OR_DEPARTMENT_OTHER)
Admission: RE | Disposition: A | Discharge status: Home / Self Care | Payer: Self-pay | Source: Home / Self Care | Attending: Obstetrics and Gynecology

## 2023-05-11 ENCOUNTER — Observation Stay (HOSPITAL_BASED_OUTPATIENT_CLINIC_OR_DEPARTMENT_OTHER): Payer: BC Managed Care – PPO | Admitting: Anesthesiology

## 2023-05-11 ENCOUNTER — Encounter (HOSPITAL_BASED_OUTPATIENT_CLINIC_OR_DEPARTMENT_OTHER): Payer: Self-pay | Admitting: Obstetrics and Gynecology

## 2023-05-11 ENCOUNTER — Other Ambulatory Visit: Payer: Self-pay

## 2023-05-11 ENCOUNTER — Observation Stay (HOSPITAL_BASED_OUTPATIENT_CLINIC_OR_DEPARTMENT_OTHER)
Admission: RE | Admit: 2023-05-11 | Discharge: 2023-05-12 | Disposition: A | Discharge status: Home / Self Care | Payer: BC Managed Care – PPO | Attending: Obstetrics and Gynecology | Admitting: Obstetrics and Gynecology

## 2023-05-11 DIAGNOSIS — Z79899 Other long term (current) drug therapy: Secondary | ICD-10-CM | POA: Insufficient documentation

## 2023-05-11 DIAGNOSIS — D259 Leiomyoma of uterus, unspecified: Secondary | ICD-10-CM | POA: Diagnosis present

## 2023-05-11 DIAGNOSIS — N920 Excessive and frequent menstruation with regular cycle: Secondary | ICD-10-CM | POA: Insufficient documentation

## 2023-05-11 DIAGNOSIS — Z01818 Encounter for other preprocedural examination: Principal | ICD-10-CM

## 2023-05-11 DIAGNOSIS — E039 Hypothyroidism, unspecified: Secondary | ICD-10-CM | POA: Diagnosis not present

## 2023-05-11 DIAGNOSIS — I10 Essential (primary) hypertension: Secondary | ICD-10-CM | POA: Diagnosis not present

## 2023-05-11 DIAGNOSIS — D219 Benign neoplasm of connective and other soft tissue, unspecified: Secondary | ICD-10-CM | POA: Diagnosis present

## 2023-05-11 HISTORY — DX: Hypothyroidism, unspecified: E03.9

## 2023-05-11 HISTORY — DX: Leiomyoma of uterus, unspecified: D25.9

## 2023-05-11 HISTORY — DX: Personal history of other diseases of the nervous system and sense organs: Z86.69

## 2023-05-11 HISTORY — DX: Presence of spectacles and contact lenses: Z97.3

## 2023-05-11 HISTORY — DX: Pure hypercholesterolemia, unspecified: E78.00

## 2023-05-11 HISTORY — PX: ROBOTIC ASSISTED LAPAROSCOPIC HYSTERECTOMY AND SALPINGECTOMY: SHX6379

## 2023-05-11 HISTORY — DX: Palpitations: R00.2

## 2023-05-11 HISTORY — DX: Personal history of other specified conditions: Z87.898

## 2023-05-11 LAB — TYPE AND SCREEN
ABO/RH(D): O POS
Antibody Screen: NEGATIVE

## 2023-05-11 LAB — POCT PREGNANCY, URINE: Preg Test, Ur: NEGATIVE

## 2023-05-11 SURGERY — XI ROBOTIC ASSISTED LAPAROSCOPIC HYSTERECTOMY AND SALPINGECTOMY
Anesthesia: General | Site: Pelvis | Laterality: Bilateral

## 2023-05-11 MED ORDER — LIDOCAINE 2% (20 MG/ML) 5 ML SYRINGE
INTRAMUSCULAR | Status: DC | PRN
  Administered 2023-05-11: 60 mg via INTRAVENOUS

## 2023-05-11 MED ORDER — FENTANYL CITRATE (PF) 250 MCG/5ML IJ SOLN
INTRAMUSCULAR | Status: AC
  Filled 2023-05-11: qty 5

## 2023-05-11 MED ORDER — CIPROFLOXACIN IN D5W 400 MG/200ML IV SOLN
400.0000 mg | INTRAVENOUS | Status: AC
  Administered 2023-05-11: 400 mg via INTRAVENOUS

## 2023-05-11 MED ORDER — PHENYLEPHRINE 80 MCG/ML (10ML) SYRINGE FOR IV PUSH (FOR BLOOD PRESSURE SUPPORT)
PREFILLED_SYRINGE | INTRAVENOUS | Status: AC
  Filled 2023-05-11: qty 10

## 2023-05-11 MED ORDER — LISINOPRIL-HYDROCHLOROTHIAZIDE 20-25 MG PO TABS: 1.0000 | ORAL_TABLET | Freq: Every day | ORAL | Status: DC

## 2023-05-11 MED ORDER — POVIDONE-IODINE 10 % EX SWAB: 2.0000 | Freq: Once | CUTANEOUS | Status: DC

## 2023-05-11 MED ORDER — ACETAMINOPHEN 500 MG PO TABS
1000.0000 mg | ORAL_TABLET | ORAL | Status: AC
  Administered 2023-05-11: 1000 mg via ORAL

## 2023-05-11 MED ORDER — MIDAZOLAM HCL 5 MG/5ML IJ SOLN
INTRAMUSCULAR | Status: DC | PRN
  Administered 2023-05-11: 2 mg via INTRAVENOUS

## 2023-05-11 MED ORDER — SODIUM CHLORIDE 0.9 % IR SOLN
Status: DC | PRN
  Administered 2023-05-11: 1000 mL

## 2023-05-11 MED ORDER — OXYCODONE HCL 5 MG PO TABS
ORAL_TABLET | ORAL | Status: AC
  Filled 2023-05-11: qty 1

## 2023-05-11 MED ORDER — ACETAMINOPHEN 500 MG PO TABS: 1000.0000 mg | ORAL_TABLET | Freq: Three times a day (TID) | ORAL | 0 refills | Status: AC | PRN

## 2023-05-11 MED ORDER — ONDANSETRON HCL 4 MG/2ML IJ SOLN
INTRAMUSCULAR | Status: DC | PRN
  Administered 2023-05-11: 4 mg via INTRAVENOUS

## 2023-05-11 MED ORDER — SCOPOLAMINE 1 MG/3DAYS TD PT72
1.0000 | MEDICATED_PATCH | Freq: Once | TRANSDERMAL | Status: DC
  Administered 2023-05-11: 1.5 mg via TRANSDERMAL

## 2023-05-11 MED ORDER — LACTATED RINGERS IV SOLN: INTRAVENOUS | Status: DC

## 2023-05-11 MED ORDER — OXYCODONE HCL 5 MG PO TABS: 5.0000 mg | ORAL_TABLET | ORAL | 0 refills | Status: AC | PRN

## 2023-05-11 MED ORDER — SENNA 8.6 MG PO TABS
1.0000 | ORAL_TABLET | Freq: Two times a day (BID) | ORAL | Status: DC
  Administered 2023-05-11: 8.6 mg via ORAL

## 2023-05-11 MED ORDER — LIDOCAINE HCL (PF) 2 % IJ SOLN
INTRAMUSCULAR | Status: AC
  Filled 2023-05-11: qty 5

## 2023-05-11 MED ORDER — LACTATED RINGERS IV BOLUS
500.0000 mL | Freq: Once | INTRAVENOUS | Status: AC
  Administered 2023-05-11: 500 mL via INTRAVENOUS

## 2023-05-11 MED ORDER — ONDANSETRON HCL 4 MG PO TABS: 4.0000 mg | ORAL_TABLET | Freq: Four times a day (QID) | ORAL | Status: DC | PRN

## 2023-05-11 MED ORDER — CIPROFLOXACIN IN D5W 400 MG/200ML IV SOLN
INTRAVENOUS | Status: AC
  Filled 2023-05-11: qty 200

## 2023-05-11 MED ORDER — CLINDAMYCIN PHOSPHATE 900 MG/50ML IV SOLN
900.0000 mg | INTRAVENOUS | Status: AC
  Administered 2023-05-11: 900 mg via INTRAVENOUS

## 2023-05-11 MED ORDER — MIDAZOLAM HCL 2 MG/2ML IJ SOLN
INTRAMUSCULAR | Status: AC
  Filled 2023-05-11: qty 2

## 2023-05-11 MED ORDER — GABAPENTIN 300 MG PO CAPS
ORAL_CAPSULE | ORAL | Status: AC
  Filled 2023-05-11: qty 1

## 2023-05-11 MED ORDER — STERILE WATER FOR IRRIGATION IR SOLN
Status: DC | PRN
  Administered 2023-05-11: 500 mL

## 2023-05-11 MED ORDER — IBUPROFEN 200 MG PO TABS
600.0000 mg | ORAL_TABLET | Freq: Four times a day (QID) | ORAL | Status: DC
  Administered 2023-05-11 – 2023-05-12 (×2): 600 mg via ORAL

## 2023-05-11 MED ORDER — SODIUM CHLORIDE 0.9 % IV SOLN
INTRAVENOUS | Status: DC | PRN
  Administered 2023-05-11: 60 mL

## 2023-05-11 MED ORDER — KETOROLAC TROMETHAMINE 30 MG/ML IJ SOLN
INTRAMUSCULAR | Status: AC
  Filled 2023-05-11: qty 1

## 2023-05-11 MED ORDER — CLINDAMYCIN PHOSPHATE 900 MG/50ML IV SOLN
INTRAVENOUS | Status: AC
  Filled 2023-05-11: qty 50

## 2023-05-11 MED ORDER — DROPERIDOL 2.5 MG/ML IJ SOLN: 0.6250 mg | Freq: Once | INTRAMUSCULAR | Status: DC | PRN

## 2023-05-11 MED ORDER — DEXMEDETOMIDINE HCL IN NACL 80 MCG/20ML IV SOLN
INTRAVENOUS | Status: DC | PRN
  Administered 2023-05-11: 8 ug via INTRAVENOUS

## 2023-05-11 MED ORDER — HYDROMORPHONE HCL 1 MG/ML IJ SOLN
0.2500 mg | INTRAMUSCULAR | Status: DC | PRN
  Administered 2023-05-11 (×6): 0.25 mg via INTRAVENOUS

## 2023-05-11 MED ORDER — HYDROCHLOROTHIAZIDE 25 MG PO TABS
25.0000 mg | ORAL_TABLET | Freq: Every day | ORAL | Status: DC
  Filled 2023-05-11: qty 1

## 2023-05-11 MED ORDER — KETOROLAC TROMETHAMINE 30 MG/ML IJ SOLN
INTRAMUSCULAR | Status: DC | PRN
  Administered 2023-05-11: 30 mg via INTRAVENOUS

## 2023-05-11 MED ORDER — HYDROMORPHONE HCL 1 MG/ML IJ SOLN: 0.2000 mg | INTRAMUSCULAR | Status: DC | PRN

## 2023-05-11 MED ORDER — HEMOSTATIC AGENTS (NO CHARGE) OPTIME
TOPICAL | Status: DC | PRN
Start: 2023-05-11 — End: 2023-05-11
  Administered 2023-05-11: 1

## 2023-05-11 MED ORDER — IBUPROFEN 200 MG PO TABS
ORAL_TABLET | ORAL | Status: AC
  Filled 2023-05-11: qty 3

## 2023-05-11 MED ORDER — IBUPROFEN 600 MG PO TABS: ORAL_TABLET | ORAL | 1 refills | Status: AC

## 2023-05-11 MED ORDER — ONDANSETRON HCL 4 MG/2ML IJ SOLN
4.0000 mg | Freq: Four times a day (QID) | INTRAMUSCULAR | Status: DC | PRN
  Administered 2023-05-11: 4 mg via INTRAVENOUS

## 2023-05-11 MED ORDER — ALUM & MAG HYDROXIDE-SIMETH 200-200-20 MG/5ML PO SUSP: 30.0000 mL | ORAL | Status: DC | PRN

## 2023-05-11 MED ORDER — DEXAMETHASONE SODIUM PHOSPHATE 10 MG/ML IJ SOLN
INTRAMUSCULAR | Status: AC
  Filled 2023-05-11: qty 1

## 2023-05-11 MED ORDER — BUPIVACAINE LIPOSOME 1.3 % IJ SUSP
INTRAMUSCULAR | Status: DC | PRN
  Administered 2023-05-11: 50 mL

## 2023-05-11 MED ORDER — HYDROMORPHONE HCL 1 MG/ML IJ SOLN
INTRAMUSCULAR | Status: AC
  Filled 2023-05-11: qty 1

## 2023-05-11 MED ORDER — ACETAMINOPHEN 500 MG PO TABS
ORAL_TABLET | ORAL | Status: AC
  Filled 2023-05-11: qty 2

## 2023-05-11 MED ORDER — ROCURONIUM BROMIDE 10 MG/ML (PF) SYRINGE
PREFILLED_SYRINGE | INTRAVENOUS | Status: AC
  Filled 2023-05-11: qty 10

## 2023-05-11 MED ORDER — PROPOFOL 10 MG/ML IV BOLUS
INTRAVENOUS | Status: AC
  Filled 2023-05-11: qty 20

## 2023-05-11 MED ORDER — OXYCODONE HCL 5 MG PO TABS
5.0000 mg | ORAL_TABLET | ORAL | Status: DC | PRN
  Administered 2023-05-11: 5 mg via ORAL
  Administered 2023-05-11: 10 mg via ORAL
  Administered 2023-05-12: 5 mg via ORAL

## 2023-05-11 MED ORDER — ACETAMINOPHEN 500 MG PO TABS
1000.0000 mg | ORAL_TABLET | Freq: Four times a day (QID) | ORAL | Status: DC
  Administered 2023-05-11 – 2023-05-12 (×2): 1000 mg via ORAL

## 2023-05-11 MED ORDER — SUGAMMADEX SODIUM 200 MG/2ML IV SOLN
INTRAVENOUS | Status: DC | PRN
  Administered 2023-05-11: 140 mg via INTRAVENOUS

## 2023-05-11 MED ORDER — SIMETHICONE 80 MG PO CHEW: 80.0000 mg | CHEWABLE_TABLET | Freq: Four times a day (QID) | ORAL | Status: DC | PRN

## 2023-05-11 MED ORDER — FLUORESCEIN SODIUM 10 % IV SOLN
INTRAVENOUS | Status: AC
  Filled 2023-05-11: qty 5

## 2023-05-11 MED ORDER — LISINOPRIL 20 MG PO TABS
20.0000 mg | ORAL_TABLET | Freq: Every day | ORAL | Status: DC
  Filled 2023-05-11: qty 1

## 2023-05-11 MED ORDER — ONDANSETRON HCL 4 MG/2ML IJ SOLN
INTRAMUSCULAR | Status: AC
  Filled 2023-05-11: qty 2

## 2023-05-11 MED ORDER — FENTANYL CITRATE (PF) 100 MCG/2ML IJ SOLN
INTRAMUSCULAR | Status: DC | PRN
  Administered 2023-05-11: 75 ug via INTRAVENOUS
  Administered 2023-05-11: 50 ug via INTRAVENOUS

## 2023-05-11 MED ORDER — ROCURONIUM BROMIDE 10 MG/ML (PF) SYRINGE
PREFILLED_SYRINGE | INTRAVENOUS | Status: DC | PRN
  Administered 2023-05-11: 10 mg via INTRAVENOUS
  Administered 2023-05-11: 50 mg via INTRAVENOUS
  Administered 2023-05-11: 20 mg via INTRAVENOUS

## 2023-05-11 MED ORDER — GABAPENTIN 300 MG PO CAPS
300.0000 mg | ORAL_CAPSULE | ORAL | Status: AC
  Administered 2023-05-11: 300 mg via ORAL

## 2023-05-11 MED ORDER — MENTHOL 3 MG MT LOZG: 1.0000 | LOZENGE | OROMUCOSAL | Status: DC | PRN

## 2023-05-11 MED ORDER — SCOPOLAMINE 1 MG/3DAYS TD PT72
MEDICATED_PATCH | TRANSDERMAL | Status: AC
  Filled 2023-05-11: qty 1

## 2023-05-11 MED ORDER — ZOLPIDEM TARTRATE 5 MG PO TABS: 5.0000 mg | ORAL_TABLET | Freq: Every evening | ORAL | Status: DC | PRN

## 2023-05-11 MED ORDER — PROPOFOL 10 MG/ML IV BOLUS
INTRAVENOUS | Status: DC | PRN
  Administered 2023-05-11: 140 mg via INTRAVENOUS

## 2023-05-11 MED ORDER — PHENYLEPHRINE 80 MCG/ML (10ML) SYRINGE FOR IV PUSH (FOR BLOOD PRESSURE SUPPORT)
PREFILLED_SYRINGE | INTRAVENOUS | Status: DC | PRN
  Administered 2023-05-11 (×4): 80 ug via INTRAVENOUS

## 2023-05-11 MED ORDER — SENNA 8.6 MG PO TABS
ORAL_TABLET | ORAL | Status: AC
  Filled 2023-05-11: qty 1

## 2023-05-11 MED ORDER — OXYCODONE HCL 5 MG PO TABS
ORAL_TABLET | ORAL | Status: AC
  Filled 2023-05-11: qty 2

## 2023-05-11 SURGICAL SUPPLY — 80 items
ADH SKN CLS APL DERMABOND .7 (GAUZE/BANDAGES/DRESSINGS) ×1
APL SRG 38 LTWT LNG FL B (MISCELLANEOUS) ×1
APPLICATOR ARISTA FLEXITIP XL (MISCELLANEOUS) IMPLANT
BAG DRN RND TRDRP ANRFLXCHMBR (UROLOGICAL SUPPLIES) ×1
BAG URINE DRAIN 2000ML AR STRL (UROLOGICAL SUPPLIES) ×1 IMPLANT
BARRIER ADHS 3X4 INTERCEED (GAUZE/BANDAGES/DRESSINGS) IMPLANT
BRR ADH 4X3 ABS CNTRL BYND (GAUZE/BANDAGES/DRESSINGS)
CANNULA CAP OBTURATR AIRSEAL 8 (CAP) IMPLANT
CATH FOLEY 3WAY 5CC 16FR (CATHETERS) ×1 IMPLANT
CAUTERY HOOK MNPLR 1.6 DVNC XI (INSTRUMENTS) IMPLANT
CELLS DAT CNTRL 66122 CELL SVR (MISCELLANEOUS) IMPLANT
COVER BACK TABLE 60X90IN (DRAPES) ×1 IMPLANT
COVER TIP SHEARS 8 DVNC (MISCELLANEOUS) ×1 IMPLANT
DEFOGGER SCOPE WARMER CLEARIFY (MISCELLANEOUS) ×1 IMPLANT
DERMABOND ADVANCED .7 DNX12 (GAUZE/BANDAGES/DRESSINGS) ×1 IMPLANT
DILATOR CANAL MILEX (MISCELLANEOUS) ×1 IMPLANT
DRAPE ARM DVNC X/XI (DISPOSABLE) ×4 IMPLANT
DRAPE COLUMN DVNC XI (DISPOSABLE) ×1 IMPLANT
DRAPE SURG IRRIG POUCH 19X23 (DRAPES) ×1 IMPLANT
DRAPE UTILITY XL STRL (DRAPES) ×1 IMPLANT
DRIVER NDL MEGA 8 DVNC XI (INSTRUMENTS) ×1 IMPLANT
DRIVER NDLE MEGA DVNC XI (INSTRUMENTS) ×1 IMPLANT
DURAPREP 26ML APPLICATOR (WOUND CARE) ×1 IMPLANT
ELECT REM PT RETURN 9FT ADLT (ELECTROSURGICAL) ×1
ELECTRODE REM PT RTRN 9FT ADLT (ELECTROSURGICAL) ×1 IMPLANT
FORCEPS BPLR LNG DVNC XI (INSTRUMENTS) ×1 IMPLANT
FORCEPS PROGRASP DVNC XI (FORCEP) IMPLANT
GAUZE 4X4 16PLY ~~LOC~~+RFID DBL (SPONGE) IMPLANT
GLOVE BIOGEL M 6.5 STRL (GLOVE) ×3 IMPLANT
GLOVE BIOGEL PI IND STRL 6.5 (GLOVE) ×3 IMPLANT
GRASPER COBRA DVNC RU (INSTRUMENTS) ×1 IMPLANT
HEMOSTAT ARISTA ABSORB 3G PWDR (HEMOSTASIS) IMPLANT
HOLDER FOLEY CATH W/STRAP (MISCELLANEOUS) IMPLANT
IRRIG SUCT STRYKERFLOW 2 WTIP (MISCELLANEOUS) ×1
IRRIGATION SUCT STRKRFLW 2 WTP (MISCELLANEOUS) ×1 IMPLANT
IV NS 1000ML (IV SOLUTION) ×1
IV NS 1000ML BAXH (IV SOLUTION) IMPLANT
KIT PINK PAD W/HEAD ARE REST (MISCELLANEOUS) ×1
KIT PINK PAD W/HEAD ARM REST (MISCELLANEOUS) ×1 IMPLANT
KIT TURNOVER CYSTO (KITS) ×1 IMPLANT
LEGGING LITHOTOMY PAIR STRL (DRAPES) ×1 IMPLANT
MANIFOLD NEPTUNE II (INSTRUMENTS) ×1 IMPLANT
MANIPULATOR ADVINCU DEL 2.5 PL (MISCELLANEOUS) IMPLANT
OBTURATOR OPTICAL STND 8 DVNC (TROCAR) ×1
OBTURATOR OPTICALSTD 8 DVNC (TROCAR) ×1 IMPLANT
OCCLUDER COLPOPNEUMO (BALLOONS) ×1 IMPLANT
PACK ROBOT WH (CUSTOM PROCEDURE TRAY) ×1 IMPLANT
PACK ROBOTIC GOWN (GOWN DISPOSABLE) ×1 IMPLANT
PAD OB MATERNITY 4.3X12.25 (PERSONAL CARE ITEMS) ×1 IMPLANT
PAD PREP 24X48 CUFFED NSTRL (MISCELLANEOUS) ×1 IMPLANT
PROTECTOR NERVE ULNAR (MISCELLANEOUS) ×1 IMPLANT
RETRACTOR WND ALEXIS 18 MED (MISCELLANEOUS) IMPLANT
RTRCTR WOUND ALEXIS 18CM MED (MISCELLANEOUS)
RTRCTR WOUND ALEXIS 18CM SML (INSTRUMENTS)
SAVER CELL AAL HAEMONETICS (INSTRUMENTS) IMPLANT
SCISSORS LAP 5X45 EPIX DISP (ENDOMECHANICALS) IMPLANT
SCISSORS MNPLR CVD DVNC XI (INSTRUMENTS) ×1 IMPLANT
SEAL UNIV 5-12 XI (MISCELLANEOUS) ×2 IMPLANT
SEALER VESSEL EXT DVNC XI (MISCELLANEOUS) IMPLANT
SET IRRIG Y TYPE TUR BLADDER L (SET/KITS/TRAYS/PACK) IMPLANT
SET TRI-LUMEN FLTR TB AIRSEAL (TUBING) IMPLANT
SET TUBE FILTERED XL AIRSEAL (SET/KITS/TRAYS/PACK) IMPLANT
SLEEVE SCD COMPRESS KNEE MED (STOCKING) ×1 IMPLANT
SPIKE FLUID TRANSFER (MISCELLANEOUS) ×2 IMPLANT
SUT VIC AB 0 CT1 27 (SUTURE) ×2
SUT VIC AB 0 CT1 27XBRD ANBCTR (SUTURE) ×2 IMPLANT
SUT VICRYL 0 UR6 27IN ABS (SUTURE) IMPLANT
SUT VICRYL RAPIDE 4/0 PS 2 (SUTURE) ×2 IMPLANT
SUT VLOC 180 0 6IN GS21 (SUTURE) IMPLANT
SUT VLOC 180 0 9IN GS21 (SUTURE) ×1 IMPLANT
SYR 10ML LL (SYRINGE) IMPLANT
TIP RUMI ORANGE 6.7MMX12CM (TIP) IMPLANT
TIP UTERINE 5.1X6CM LAV DISP (MISCELLANEOUS) IMPLANT
TIP UTERINE 6.7X10CM GRN DISP (MISCELLANEOUS) IMPLANT
TIP UTERINE 6.7X6CM WHT DISP (MISCELLANEOUS) IMPLANT
TIP UTERINE 6.7X8CM BLUE DISP (MISCELLANEOUS) IMPLANT
TOWEL OR 17X24 6PK STRL BLUE (TOWEL DISPOSABLE) ×1 IMPLANT
TROCAR PORT AIRSEAL 8X120 (TROCAR) IMPLANT
WATER STERILE IRR 1000ML POUR (IV SOLUTION) ×1 IMPLANT
WATER STERILE IRR 500ML POUR (IV SOLUTION) IMPLANT

## 2023-05-11 NOTE — Anesthesia Procedure Notes (Signed)
Procedure Name: Intubation Date/Time: 05/11/2023 11:56 AM  Performed by: Briant Sites, CRNAPre-anesthesia Checklist: Patient identified, Emergency Drugs available, Suction available and Patient being monitored Patient Re-evaluated:Patient Re-evaluated prior to induction Oxygen Delivery Method: Circle system utilized Preoxygenation: Pre-oxygenation with 100% oxygen Induction Type: IV induction Ventilation: Mask ventilation without difficulty Laryngoscope Size: Mac and 3 Grade View: Grade I Tube type: Oral Tube size: 7.0 mm Number of attempts: 1 Airway Equipment and Method: Stylet Placement Confirmation: ETT inserted through vocal cords under direct vision, positive ETCO2 and breath sounds checked- equal and bilateral Secured at: 19 cm Tube secured with: Tape Dental Injury: Teeth and Oropharynx as per pre-operative assessment

## 2023-05-11 NOTE — Anesthesia Preprocedure Evaluation (Addendum)
Anesthesia Evaluation  Patient identified by MRN, date of birth, ID band Patient awake    Reviewed: Allergy & Precautions, NPO status , Patient's Chart, lab work & pertinent test results, reviewed documented beta blocker date and time   History of Anesthesia Complications Negative for: history of anesthetic complications  Airway Mallampati: I  TM Distance: >3 FB Neck ROM: Full    Dental no notable dental hx.    Pulmonary neg pulmonary ROS   Pulmonary exam normal        Cardiovascular hypertension, Pt. on home beta blockers and Pt. on medications Normal cardiovascular exam     Neuro/Psych    GI/Hepatic Neg liver ROS, PUD,GERD  Medicated,,UC   Endo/Other  Hypothyroidism    Renal/GU negative Renal ROS     Musculoskeletal negative musculoskeletal ROS (+)    Abdominal   Peds  Hematology  (+) Blood dyscrasia (Hgb 10.5), anemia   Anesthesia Other Findings Day of surgery medications reviewed with patient.  Reproductive/Obstetrics fibroids                              Anesthesia Physical Anesthesia Plan  ASA: 2  Anesthesia Plan: General   Post-op Pain Management: Tylenol PO (pre-op)* and Toradol IV (intra-op)*   Induction: Intravenous  PONV Risk Score and Plan: 3 and Treatment may vary due to age or medical condition, Ondansetron, Dexamethasone, Midazolam and Scopolamine patch - Pre-op  Airway Management Planned: Oral ETT  Additional Equipment: None  Intra-op Plan:   Post-operative Plan: Extubation in OR  Informed Consent: I have reviewed the patients History and Physical, chart, labs and discussed the procedure including the risks, benefits and alternatives for the proposed anesthesia with the patient or authorized representative who has indicated his/her understanding and acceptance.     Dental advisory given  Plan Discussed with: CRNA  Anesthesia Plan Comments:          Anesthesia Quick Evaluation

## 2023-05-11 NOTE — Transfer of Care (Signed)
Immediate Anesthesia Transfer of Care Note  Patient: Gabrielle Wilkins  Procedure(s) Performed: XI ROBOTIC ASSISTED LAPAROSCOPIC HYSTERECTOMY AND SALPINGECTOMY (Bilateral: Pelvis)  Patient Location: PACU  Anesthesia Type:General  Level of Consciousness: drowsy  Airway & Oxygen Therapy: Patient Spontanous Breathing and Patient connected to nasal cannula oxygen  Post-op Assessment: Report given to RN  Post vital signs: Reviewed and stable  Last Vitals:  Vitals Value Taken Time  BP 122/85 05/11/23 1400  Temp    Pulse 58 05/11/23 1402  Resp 15 05/11/23 1402  SpO2 100 % 05/11/23 1402  Vitals shown include unfiled device data.  Last Pain:  Vitals:   05/11/23 1000  TempSrc: Oral  PainSc: 0-No pain      Patients Stated Pain Goal: 1 (05/11/23 1000)  Complications: No notable events documented.

## 2023-05-11 NOTE — Op Note (Signed)
05/11/2023  2:14 PM  PATIENT:  Gabrielle Wilkins  47 y.o. female  PRE-OPERATIVE DIAGNOSIS:  Fibroids Menorrhagia with irregular cycle  POST-OPERATIVE DIAGNOSIS:  FibroidsMenorrhagia with irregular cycle  PROCEDURE:  Procedure(s): XI ROBOTIC ASSISTED LAPAROSCOPIC HYSTERECTOMY AND SALPINGECTOMY (Bilateral)  SURGEON:  Surgeons and Role:    Gerald Leitz, MD - Primary  PHYSICIAN ASSISTANT:   ASSISTANTS: Karmen Stabs RNFA assisted due to complexity of the anatomy.    ANESTHESIA:   general  EBL:  225 mL   BLOOD ADMINISTERED:none  DRAINS: Urinary Catheter (Foley)   LOCAL MEDICATIONS USED:  MARCAINE    and OTHER exparel and ropivicaine  SPECIMEN:  Source of Specimen:  uterus cervix and bilateral fallopian tubes   DISPOSITION OF SPECIMEN:  PATHOLOGY  COUNTS:  YES  TOURNIQUET:  * No tourniquets in log *  DICTATION: .Note written in EPIC  PLAN OF CARE: Admit for overnight observation  PATIENT DISPOSITION:  PACU - hemodynamically stable.   Delay start of Pharmacological VTE agent (>24hrs) due to surgical blood loss or risk of bleeding: not applicable  Findings: normal external genitalia. Normal appearing cervix. Enlarged fibroid uterus normal appearing fallopian tubes and ovaries.   Procedure: The patient was taken to the operating room where she was placed under general anesthesia. Time out was performed. Marland Kitchen She was placed in dorsal lithotomy position and prepped and draped in the usual sterile fashion. A weighted speculum was placed into the vagina. A Deaver was placed anteriorly for retraction. The anterior lip of the cervix was grasped with a single-tooth tenaculum. The vaginal mucosa was injected with 2.5 cc of exparil/marcaine at the 2/4/ 8 and 10 o'clock positions. The uterus was sounded to 8 cm. the cervix was dilated to 6 mm . 0 vicryl suture placed at the 12 o'clock  Of the cervix to facilitate placement of a Ru mi uterine manipulator. The manipulator was placed without  difficulty. Weighted speculum and Deaver were removed .  Attention was turned to the patient's abdomen where a 8 mm trocar was placed at the umbilicus under direct visualization . The pneumoperitoneum was achieved with PCO2 gas.  An 8 mm trocar was placed in the right upper quadrant 10 centimeters from the umbilicus.later connected to robotic arm #3). An 8 mm incision was made in the left upper quadrant 10 cm from the umbilicus and connected to robot arm #1. ( All incision sites were injected with 10cc of exparel/marcaine prior to port placement. )  Once all ports had been placed under direct visualization.The laparoscope was removed and the da Vinci robotic system was thin right-sided docked. The robotic arms were connected to the corresponding trocars as listed above. The laparoscope was then reinserted. The long tip bipolar forceps were placed into port #1. A vessel sealer was placed in port #3. All instruments were directed into the pelvis under direct visualization.  Attention was turned to the surgeons console.. The left mesosalpinx and   left utero-ovarian ligament was cauterized and transected with the vessel sealer The broad ligament was cauterized and transected with the vessel sealer .The round ligament was cauterized and transected with the vessel sealer  The anterior leaf of broad ligament was incised along the bladder reflection to the midline.  The right mesosalpinx and right  utero-ovarian ligament was cauterized and transected with the vessel sealer. The right broad ligament was cauterized and transected with the vessel sealer. The right round ligament was cauterized and transected with the vessel sealer The broad ligament was  incised to the midline. The bladder was dissected off the lower uterine segments of the cervix via sharp and blunt dissection.   The uterine arteries were skeleton bilaterally. They were cauterized and transected with the vessel sealer The KOH ring was identified. The  anterior colpotomy was performed followed by the posterior colpotomy. Once the uterus and cervix were completely excised , they  were removed through the vagina. A vaginal laceration occurred during removal of the uterus from the vagina. This was reapproximated with 0 vicryl.  The  bipolar forceps and scissors were removed and cobra forceps were placed in the port #1 and the mega needle driver was placed in to port #3.  The vaginal cuff was closed with running suture if 0 v-lock. The pelvis was irrigated. Marland KitchenMarland KitchenMarland KitchenExcellent hemostasis was noted. Arista was placed along the vaginal cuff.  All pelvic pedicles were examined and hemostasis was noted.  All instruments removed from the ports. All ports were removed under direct Visualization. The pneumoperitoneum was released. The skin incisions were closed with 4-0 Vicryl and then covered with Derma bond.     Sponge lap and needle counts weIre correct x 2. The patient was awakened from anesthesia and taken to the recovery room in stable condition.

## 2023-05-11 NOTE — Anesthesia Postprocedure Evaluation (Signed)
Anesthesia Post Note  Patient: Gabrielle Wilkins  Procedure(s) Performed: XI ROBOTIC ASSISTED LAPAROSCOPIC HYSTERECTOMY AND SALPINGECTOMY (Bilateral: Pelvis)     Patient location during evaluation: PACU Anesthesia Type: General Level of consciousness: awake and alert Pain management: pain level controlled Vital Signs Assessment: post-procedure vital signs reviewed and stable Respiratory status: spontaneous breathing, nonlabored ventilation and respiratory function stable Cardiovascular status: blood pressure returned to baseline Postop Assessment: no apparent nausea or vomiting Anesthetic complications: no   No notable events documented.  Last Vitals:  Vitals:   05/11/23 1515 05/11/23 1530  BP: 110/82 113/78  Pulse: 60   Resp: 15   Temp:  (!) 36.3 C  SpO2: 95%     Last Pain:  Vitals:   05/11/23 1530  TempSrc:   PainSc: 3                  Shanda Howells

## 2023-05-11 NOTE — H&P (Signed)
Date of Initial H&P: 05/08/2023  History reviewed, patient examined, no change in status, stable for surgery.

## 2023-05-12 ENCOUNTER — Encounter (HOSPITAL_BASED_OUTPATIENT_CLINIC_OR_DEPARTMENT_OTHER): Payer: Self-pay | Admitting: Obstetrics and Gynecology

## 2023-05-12 DIAGNOSIS — D259 Leiomyoma of uterus, unspecified: Secondary | ICD-10-CM | POA: Diagnosis not present

## 2023-05-12 LAB — HEMOGLOBIN: Hemoglobin: 7.8 g/dL — ABNORMAL LOW (ref 12.0–15.0)

## 2023-05-12 MED ORDER — OXYCODONE HCL 5 MG PO TABS
ORAL_TABLET | ORAL | Status: AC
  Filled 2023-05-12: qty 1

## 2023-05-12 MED ORDER — IBUPROFEN 200 MG PO TABS
ORAL_TABLET | ORAL | Status: AC
  Filled 2023-05-12: qty 3

## 2023-05-12 NOTE — Discharge Summary (Signed)
Physician Discharge Summary  Patient ID: JAE GAMBY MRN: [id number] DOB/AGE: October 10, 1975 47 y.o.  Admit date: 05/11/2023 Discharge date: 05/12/2023  Admission Diagnoses:status post robotic assisted laparoscopic hysterectomy with bilateral salpingectomy   Discharge Diagnoses: status post robotic assisted laparoscopic hysterectomy with bilateral salpingectomy  Principal Problem:   Fibroids   Discharged Condition: stable  Hospital Course: pt was admitted for observation after undergoing robotic assisted laparoscopic hysterectomy with bilateral salpingectomy. She did well postoperatively with return of bladder and bowel function. She is discharged on pod #1 tolerating po/ ambulating and pain is well controlled.   Consults: None  Significant Diagnostic Studies: labs: hgb on pod #1 was 7.8  Treatments: surgery: robotic assisted laparoscopic hysterectomy with bilateral salpingectomy   Discharge Exam: Blood pressure 101/73, pulse 64, temperature 98.4 F (36.9 C), temperature source Oral, resp. rate 16, height 5\' 2"  (1.575 m), weight 67.5 kg, last menstrual period 04/06/2023, SpO2 97%. General appearance: alert, cooperative, and no distress Resp: no distress  GI: soft appropriately tender nondistended + BS no rebound and no guarding  Extremities: extremities normal, atraumatic, no cyanosis or edema Incision/Wound: well approximated without erythema or exudate   Disposition: Discharge disposition: 01-Home or Self Care       Discharge Instructions     Call MD for:  persistant nausea and vomiting   Complete by: As directed    Call MD for:  redness, tenderness, or signs of infection (pain, swelling, redness, odor or green/yellow discharge around incision site)   Complete by: As directed    Call MD for:  severe uncontrolled pain   Complete by: As directed    Call MD for:  temperature >100.4   Complete by: As directed    Diet general   Complete by: As directed    Driving  Restrictions   Complete by: As directed    Avoid driving for 1 week   Increase activity slowly   Complete by: As directed    Lifting restrictions   Complete by: As directed    Avoid lifting over 10 lbs   May shower / Bathe   Complete by: As directed    May walk up steps   Complete by: As directed    No wound care   Complete by: As directed    Sexual Activity Restrictions   Complete by: As directed    Avoid sex for 6 weeks and until approved by Dr. Richardson Dopp      Allergies as of 05/12/2023       Reactions   Golimumab Other (See Comments)   Other reaction(s): swelling, hives   Prednisone Other (See Comments)   Caused central serous retinopathy. Patient cautioned not to take Prednisone again because it could cause blindness.   Ceclor [cefaclor] Hives   Patient said she can tolerate penicillin.  Hasn't had Keflex.   Remicade [infliximab] Other (See Comments)   plaque psoriasis   Vedolizumab Hives        Medication List     TAKE these medications    acetaminophen 500 MG tablet Commonly known as: TYLENOL Take 2 tablets (1,000 mg total) by mouth every 8 (eight) hours as needed for mild pain or moderate pain.   cyanocobalamin 500 MCG tablet Commonly known as: VITAMIN B12 Take 500 mcg by mouth daily.   ferrous sulfate 324 MG Tbec Take 324 mg by mouth.   ibuprofen 600 MG tablet Commonly known as: ADVIL 1 tablets by mouth every 6 hours for 48 hours then every 6 hours  prn ( take with food)   levothyroxine 25 MCG tablet Commonly known as: SYNTHROID Take 25 mcg by mouth daily.   lisinopril-hydrochlorothiazide 20-25 MG tablet Commonly known as: ZESTORETIC TAKE ONE TABLET BY MOUTH DAILY   metoprolol succinate 25 MG 24 hr tablet Commonly known as: TOPROL-XL Take 25 mg by mouth daily.   norethindrone 0.35 MG tablet Commonly known as: MICRONOR Take 1 tablet by mouth daily.   oxyCODONE 5 MG immediate release tablet Commonly known as: Oxy IR/ROXICODONE Take 1 tablet (5  mg total) by mouth every 4 (four) hours as needed for severe pain or breakthrough pain.   pantoprazole 40 MG tablet Commonly known as: PROTONIX Take 40 mg by mouth daily.   pravastatin 10 MG tablet Commonly known as: PRAVACHOL Take 10 mg by mouth daily.   WOMENS MULTIVITAMIN PO Take by mouth.   Xeljanz 10 MG Tabs Generic drug: Tofacitinib Citrate 5 mg.        Follow-up Information     Gerald Leitz, MD. Go in 2 week(s).   Specialty: Obstetrics and Gynecology Contact information: [address] [place] [address] [phone]                 Signed: Gerald Leitz 05/12/2023, 8:26 AM

## 2023-05-13 LAB — SURGICAL PATHOLOGY

## 2023-08-29 ENCOUNTER — Encounter: Payer: Self-pay | Admitting: Hematology and Oncology

## 2023-09-24 ENCOUNTER — Other Ambulatory Visit: Payer: Self-pay | Admitting: Internal Medicine

## 2023-10-24 ENCOUNTER — Other Ambulatory Visit: Payer: Self-pay | Admitting: Internal Medicine

## 2023-10-27 ENCOUNTER — Other Ambulatory Visit: Payer: Self-pay | Admitting: Family Medicine

## 2023-10-27 DIAGNOSIS — Z1231 Encounter for screening mammogram for malignant neoplasm of breast: Secondary | ICD-10-CM

## 2023-11-06 ENCOUNTER — Other Ambulatory Visit: Payer: Self-pay | Admitting: Internal Medicine

## 2023-12-12 ENCOUNTER — Encounter: Payer: Self-pay | Admitting: Hematology and Oncology

## 2023-12-19 ENCOUNTER — Ambulatory Visit
Admission: RE | Admit: 2023-12-19 | Discharge: 2023-12-19 | Disposition: A | Discharge status: Home / Self Care | Payer: No Typology Code available for payment source | Source: Ambulatory Visit | Attending: Family Medicine | Admitting: Family Medicine

## 2023-12-19 ENCOUNTER — Encounter: Payer: Self-pay | Admitting: Hematology and Oncology

## 2023-12-19 DIAGNOSIS — Z1231 Encounter for screening mammogram for malignant neoplasm of breast: Secondary | ICD-10-CM
